# Patient Record
Sex: Female | Born: 1940 | Race: Asian | Hispanic: No | Marital: Married
Health system: Southern US, Community
[De-identification: ages and names within clinical notes are randomized; demographics above are authoritative.]

## PROBLEM LIST (undated history)

## (undated) DIAGNOSIS — B351 Tinea unguium: Secondary | ICD-10-CM

## (undated) DIAGNOSIS — R011 Cardiac murmur, unspecified: Secondary | ICD-10-CM

## (undated) DIAGNOSIS — I350 Nonrheumatic aortic (valve) stenosis: Secondary | ICD-10-CM

## (undated) DIAGNOSIS — I10 Essential (primary) hypertension: Secondary | ICD-10-CM

## (undated) DIAGNOSIS — M810 Age-related osteoporosis without current pathological fracture: Secondary | ICD-10-CM

## (undated) DIAGNOSIS — E559 Vitamin D deficiency, unspecified: Secondary | ICD-10-CM

## (undated) DIAGNOSIS — N183 Chronic kidney disease, stage 3 unspecified: Secondary | ICD-10-CM

## (undated) DIAGNOSIS — I739 Peripheral vascular disease, unspecified: Secondary | ICD-10-CM

## (undated) DIAGNOSIS — J302 Other seasonal allergic rhinitis: Secondary | ICD-10-CM

## (undated) HISTORY — DX: Tinea unguium: B35.1

## (undated) HISTORY — DX: Chronic kidney disease, stage 3 unspecified: N18.30

## (undated) HISTORY — DX: Cardiac murmur, unspecified: R01.1

## (undated) HISTORY — DX: Peripheral vascular disease, unspecified: I73.9

## (undated) HISTORY — PX: KNEE SURGERY: SHX244

## (undated) HISTORY — DX: Nonrheumatic aortic (valve) stenosis: I35.0

## (undated) HISTORY — DX: Vitamin D deficiency, unspecified: E55.9

## (undated) HISTORY — DX: Age-related osteoporosis without current pathological fracture: M81.0

## (undated) HISTORY — DX: Other seasonal allergic rhinitis: J30.2

---

## 2006-03-18 ENCOUNTER — Ambulatory Visit (HOSPITAL_COMMUNITY): Admission: RE | Admit: 2006-03-18 | Discharge: 2006-03-18 | Payer: Self-pay | Admitting: Family Medicine

## 2006-03-26 ENCOUNTER — Ambulatory Visit: Payer: Self-pay | Admitting: Nurse Practitioner

## 2010-09-29 ENCOUNTER — Encounter: Payer: Self-pay | Admitting: Internal Medicine

## 2010-11-29 ENCOUNTER — Other Ambulatory Visit: Payer: Self-pay | Admitting: Internal Medicine

## 2010-11-29 DIAGNOSIS — Z1231 Encounter for screening mammogram for malignant neoplasm of breast: Secondary | ICD-10-CM

## 2010-12-10 ENCOUNTER — Ambulatory Visit
Admission: RE | Admit: 2010-12-10 | Discharge: 2010-12-10 | Disposition: A | Discharge status: Home / Self Care | Payer: Medicare Other | Source: Ambulatory Visit | Attending: Internal Medicine | Admitting: Internal Medicine

## 2010-12-10 DIAGNOSIS — Z1231 Encounter for screening mammogram for malignant neoplasm of breast: Secondary | ICD-10-CM

## 2011-01-15 ENCOUNTER — Other Ambulatory Visit: Payer: Self-pay | Admitting: Gastroenterology

## 2011-11-17 ENCOUNTER — Other Ambulatory Visit: Payer: Self-pay | Admitting: Internal Medicine

## 2011-11-17 DIAGNOSIS — Z1231 Encounter for screening mammogram for malignant neoplasm of breast: Secondary | ICD-10-CM

## 2011-12-11 ENCOUNTER — Ambulatory Visit: Payer: Medicare Other

## 2012-10-19 ENCOUNTER — Other Ambulatory Visit (HOSPITAL_COMMUNITY): Payer: Self-pay | Admitting: Internal Medicine

## 2012-10-19 DIAGNOSIS — Z1231 Encounter for screening mammogram for malignant neoplasm of breast: Secondary | ICD-10-CM

## 2012-10-25 ENCOUNTER — Ambulatory Visit (HOSPITAL_COMMUNITY)
Admission: RE | Admit: 2012-10-25 | Discharge: 2012-10-25 | Disposition: A | Discharge status: Home / Self Care | Payer: Medicare Other | Source: Ambulatory Visit | Attending: Internal Medicine | Admitting: Internal Medicine

## 2012-10-25 DIAGNOSIS — Z1231 Encounter for screening mammogram for malignant neoplasm of breast: Secondary | ICD-10-CM

## 2013-11-25 ENCOUNTER — Other Ambulatory Visit: Payer: Self-pay

## 2013-11-25 DIAGNOSIS — Z1231 Encounter for screening mammogram for malignant neoplasm of breast: Secondary | ICD-10-CM

## 2013-12-16 ENCOUNTER — Ambulatory Visit
Admission: RE | Admit: 2013-12-16 | Discharge: 2013-12-16 | Disposition: A | Discharge status: Home / Self Care | Payer: Medicare Other | Source: Ambulatory Visit

## 2013-12-16 DIAGNOSIS — Z1231 Encounter for screening mammogram for malignant neoplasm of breast: Secondary | ICD-10-CM

## 2014-12-05 ENCOUNTER — Other Ambulatory Visit (HOSPITAL_COMMUNITY): Payer: Self-pay

## 2014-12-05 DIAGNOSIS — Z1231 Encounter for screening mammogram for malignant neoplasm of breast: Secondary | ICD-10-CM

## 2014-12-19 ENCOUNTER — Other Ambulatory Visit (HOSPITAL_COMMUNITY): Payer: Self-pay | Admitting: Internal Medicine

## 2014-12-19 ENCOUNTER — Ambulatory Visit (HOSPITAL_COMMUNITY)
Admission: RE | Admit: 2014-12-19 | Discharge: 2014-12-19 | Disposition: A | Discharge status: Home / Self Care | Payer: Commercial Managed Care - HMO | Source: Ambulatory Visit | Attending: Internal Medicine | Admitting: Internal Medicine

## 2014-12-19 DIAGNOSIS — Z1231 Encounter for screening mammogram for malignant neoplasm of breast: Secondary | ICD-10-CM | POA: Insufficient documentation

## 2015-01-22 ENCOUNTER — Other Ambulatory Visit (HOSPITAL_COMMUNITY): Payer: Self-pay | Admitting: Internal Medicine

## 2015-01-22 DIAGNOSIS — K219 Gastro-esophageal reflux disease without esophagitis: Secondary | ICD-10-CM

## 2015-02-07 ENCOUNTER — Telehealth: Payer: Self-pay | Admitting: Radiology

## 2015-02-08 ENCOUNTER — Ambulatory Visit (HOSPITAL_COMMUNITY): Payer: Commercial Managed Care - HMO

## 2015-11-22 ENCOUNTER — Other Ambulatory Visit: Payer: Self-pay

## 2015-11-22 DIAGNOSIS — Z1231 Encounter for screening mammogram for malignant neoplasm of breast: Secondary | ICD-10-CM

## 2015-12-24 ENCOUNTER — Ambulatory Visit: Payer: Self-pay

## 2016-01-21 ENCOUNTER — Ambulatory Visit
Admission: RE | Admit: 2016-01-21 | Discharge: 2016-01-21 | Disposition: A | Discharge status: Home / Self Care | Payer: Medicaid Other | Source: Ambulatory Visit

## 2016-01-21 DIAGNOSIS — Z1231 Encounter for screening mammogram for malignant neoplasm of breast: Secondary | ICD-10-CM

## 2016-03-07 ENCOUNTER — Encounter (HOSPITAL_COMMUNITY): Payer: Self-pay | Admitting: Emergency Medicine

## 2016-03-07 ENCOUNTER — Emergency Department (HOSPITAL_COMMUNITY): Payer: No Typology Code available for payment source

## 2016-03-07 ENCOUNTER — Emergency Department (HOSPITAL_COMMUNITY)
Admission: EM | Admit: 2016-03-07 | Discharge: 2016-03-07 | Disposition: A | Discharge status: Home / Self Care | Payer: No Typology Code available for payment source | Attending: Emergency Medicine | Admitting: Emergency Medicine

## 2016-03-07 DIAGNOSIS — Y939 Activity, unspecified: Secondary | ICD-10-CM | POA: Insufficient documentation

## 2016-03-07 DIAGNOSIS — S0990XA Unspecified injury of head, initial encounter: Secondary | ICD-10-CM | POA: Diagnosis not present

## 2016-03-07 DIAGNOSIS — I10 Essential (primary) hypertension: Secondary | ICD-10-CM | POA: Diagnosis not present

## 2016-03-07 DIAGNOSIS — S20219A Contusion of unspecified front wall of thorax, initial encounter: Secondary | ICD-10-CM | POA: Diagnosis not present

## 2016-03-07 DIAGNOSIS — Y92410 Unspecified street and highway as the place of occurrence of the external cause: Secondary | ICD-10-CM | POA: Diagnosis not present

## 2016-03-07 DIAGNOSIS — Z79899 Other long term (current) drug therapy: Secondary | ICD-10-CM | POA: Insufficient documentation

## 2016-03-07 DIAGNOSIS — S299XXA Unspecified injury of thorax, initial encounter: Secondary | ICD-10-CM | POA: Diagnosis present

## 2016-03-07 DIAGNOSIS — M546 Pain in thoracic spine: Secondary | ICD-10-CM | POA: Diagnosis not present

## 2016-03-07 DIAGNOSIS — Y999 Unspecified external cause status: Secondary | ICD-10-CM | POA: Insufficient documentation

## 2016-03-07 HISTORY — DX: Essential (primary) hypertension: I10

## 2016-03-07 MED ORDER — IBUPROFEN 400 MG PO TABS: 400.0000 mg | ORAL_TABLET | Freq: Three times a day (TID) | ORAL | Status: DC | PRN

## 2016-03-07 MED ORDER — OXYCODONE HCL 5 MG PO TABS
5.0000 mg | ORAL_TABLET | Freq: Once | ORAL | Status: AC
Start: 2016-03-07 — End: 2016-03-07
  Administered 2016-03-07: 5 mg via ORAL
  Filled 2016-03-07: qty 1

## 2016-03-07 MED ORDER — IBUPROFEN 600 MG PO TABS: 600.0000 mg | ORAL_TABLET | Freq: Three times a day (TID) | ORAL | Status: DC | PRN

## 2016-03-07 MED ORDER — IBUPROFEN 400 MG PO TABS
600.0000 mg | ORAL_TABLET | Freq: Once | ORAL | Status: AC
  Administered 2016-03-07: 600 mg via ORAL
  Filled 2016-03-07: qty 1

## 2016-03-07 NOTE — ED Notes (Addendum)
Patient speaks some english.  States her primary language is [place].

## 2016-03-07 NOTE — ED Notes (Signed)
Pt transported to XRay 
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
## 2017-01-14 ENCOUNTER — Other Ambulatory Visit: Payer: Self-pay | Admitting: Internal Medicine

## 2017-01-14 DIAGNOSIS — Z1231 Encounter for screening mammogram for malignant neoplasm of breast: Secondary | ICD-10-CM

## 2017-01-27 ENCOUNTER — Ambulatory Visit
Admission: RE | Admit: 2017-01-27 | Discharge: 2017-01-27 | Disposition: A | Discharge status: Home / Self Care | Payer: Medicaid Other | Source: Ambulatory Visit | Attending: Internal Medicine | Admitting: Internal Medicine

## 2017-01-27 DIAGNOSIS — Z1231 Encounter for screening mammogram for malignant neoplasm of breast: Secondary | ICD-10-CM

## 2017-03-18 NOTE — Congregational Nurse Program (Signed)
Congregational Nurse Program Note  Date of Encounter: 03/18/2017  Past Medical History: Past Medical History:  Diagnosis Date  . Hypertension     Encounter Details:  Patient was accompanied to CN office by [facility].  Diu Hartshorn interpreted.  Patient has been having right low back pain radiating down back of leg to the knee for 5 days.  Pain started after she was gardening.  States she has not tried anything to alleviate the pain  She is out of her muscle relaxant Tizanidine.  Refill requested from [facility] on [address].  Chart indicates she is allergic to Tylenol but patient does not remember stating this.  She is afraid to use the Voltaren Gel because she has itchy skin and she is not supposed to use it on open areas.  Advised to ice it until she can see her doctor at [facility]. She will ask him about pain medicine and also about a prescription for Triamcinolone Acetonide cream which he was supposed to have called in to her pharmacy after last visit, but pharmacy says it was never received.  BP today 118/60.  Jake Michaelis RN, Congregational Nurse [phone]

## 2017-05-01 ENCOUNTER — Ambulatory Visit (INDEPENDENT_AMBULATORY_CARE_PROVIDER_SITE_OTHER): Payer: Medicare HMO | Admitting: Orthopaedic Surgery

## 2017-05-26 ENCOUNTER — Encounter (INDEPENDENT_AMBULATORY_CARE_PROVIDER_SITE_OTHER): Payer: Self-pay | Admitting: Orthopaedic Surgery

## 2017-05-26 ENCOUNTER — Ambulatory Visit (INDEPENDENT_AMBULATORY_CARE_PROVIDER_SITE_OTHER): Payer: Medicare HMO

## 2017-05-26 ENCOUNTER — Ambulatory Visit (INDEPENDENT_AMBULATORY_CARE_PROVIDER_SITE_OTHER): Payer: Medicare HMO | Admitting: Orthopaedic Surgery

## 2017-05-26 DIAGNOSIS — M1711 Unilateral primary osteoarthritis, right knee: Secondary | ICD-10-CM | POA: Diagnosis not present

## 2017-05-26 HISTORY — DX: Unilateral primary osteoarthritis, right knee: M17.11

## 2017-05-26 MED ORDER — MELOXICAM 7.5 MG PO TABS: 7.5000 mg | ORAL_TABLET | Freq: Two times a day (BID) | ORAL | 2 refills | Status: DC | PRN

## 2017-05-26 NOTE — Progress Notes (Signed)
Office Visit Note   Patient: Krystal Maddox           Date of Birth: Feb 06, 1941           MRN: [id number] Visit Date: 05/26/2017              Requested by: Prince Solian, MD [address] [place], [place] [address] PCP: Prince Solian, MD   Assessment & Plan: Visit Diagnoses:  1. Primary osteoarthritis of right knee     Plan: Patient has moderate right knee osteoarthritis. Cortisone injection performed today. Prescription for Amoxil can. Questions encouraged and answered. Today's encounter was made more complex given the language barrier.  Follow-Up Instructions: Return if symptoms worsen or fail to improve.   Orders:  Orders Placed This Encounter  Procedures  . XR KNEE 3 VIEW RIGHT   Meds ordered this encounter  Medications  . meloxicam (MOBIC) 7.5 MG tablet    Sig: Take 1 tablet (7.5 mg total) by mouth 2 (two) times daily as needed for pain.    Dispense:  30 tablet    Refill:  2      Procedures: Large Joint Inj Date/Time: 05/26/2017 2:07 PM Performed by: Leandrew Koyanagi Authorized by: Leandrew Koyanagi   Consent Given by:  Patient Timeout: prior to procedure the correct patient, procedure, and site was verified   Indications:  Pain Location:  Knee Site:  R knee Prep: patient was prepped and draped in usual sterile fashion   Needle Size:  22 G Ultrasound Guidance: No   Fluoroscopic Guidance: No   Arthrogram: No   Patient tolerance:  Patient tolerated the procedure well with no immediate complications     Clinical Data: No additional findings.   Subjective: Chief Complaint  Patient presents with  . Right Knee - Pain    Patient's 76 year old female comes in with right knee pain since 03/15/2017. She denies any injuries. This progressively got worse. Pain does not radiate. Denies any numbness and tingling. Denies any injuries. She's been using over-the-counter arthritis cream with relief.    Review of Systems  Constitutional: Negative.   HENT:  Negative.   Eyes: Negative.   Respiratory: Negative.   Cardiovascular: Negative.   Endocrine: Negative.   Musculoskeletal: Negative.   Neurological: Negative.   Hematological: Negative.   Psychiatric/Behavioral: Negative.   All other systems reviewed and are negative.    Objective: Vital Signs: There were no vitals taken for this visit.  Physical Exam  Constitutional: She is oriented to person, place, and time. She appears well-developed and well-nourished.  HENT:  Head: Normocephalic and atraumatic.  Eyes: EOM are normal.  Neck: Neck supple.  Pulmonary/Chest: Effort normal.  Abdominal: Soft.  Neurological: She is alert and oriented to person, place, and time.  Skin: Skin is warm. Capillary refill takes less than 2 seconds.  Psychiatric: She has a normal mood and affect. Her behavior is normal. Judgment and thought content normal.  Nursing note and vitals reviewed.   Ortho Exam Right knee exam shows no joint effusion. She has good range of motion.  Collaterals and cruciates are stable. Patellar tracking is normal. Specialty Comments:  No specialty comments available.  Imaging: Xr Knee 3 View Right  Result Date: 05/26/2017 Moderate osteoarthritis right knee.    PMFS History: Patient Active Problem List   Diagnosis Date Noted  . Primary osteoarthritis of right knee 05/26/2017   Past Medical History:  Diagnosis Date  . Hypertension     No family history on  file.  Past Surgical History:  Procedure Laterality Date  . KNEE SURGERY Left    Social History   Occupational History  . Not on file.   Social History Main Topics  . Smoking status: Never Smoker  . Smokeless tobacco: Never Used  . Alcohol use Not on file  . Drug use: Unknown  . Sexual activity: Not on file

## 2017-08-25 ENCOUNTER — Other Ambulatory Visit: Payer: Self-pay | Admitting: Internal Medicine

## 2017-08-25 DIAGNOSIS — E2839 Other primary ovarian failure: Secondary | ICD-10-CM

## 2017-09-21 ENCOUNTER — Ambulatory Visit
Admission: RE | Admit: 2017-09-21 | Discharge: 2017-09-21 | Disposition: A | Discharge status: Home / Self Care | Payer: Medicaid Other | Source: Ambulatory Visit | Attending: Internal Medicine | Admitting: Internal Medicine

## 2017-09-21 DIAGNOSIS — E2839 Other primary ovarian failure: Secondary | ICD-10-CM

## 2019-11-10 ENCOUNTER — Other Ambulatory Visit (HOSPITAL_COMMUNITY): Payer: Self-pay | Admitting: Internal Medicine

## 2019-11-10 DIAGNOSIS — R011 Cardiac murmur, unspecified: Secondary | ICD-10-CM

## 2019-11-18 ENCOUNTER — Other Ambulatory Visit: Payer: Self-pay

## 2019-11-18 ENCOUNTER — Ambulatory Visit (HOSPITAL_COMMUNITY)
Admission: RE | Admit: 2019-11-18 | Discharge: 2019-11-18 | Disposition: A | Discharge status: Home / Self Care | Payer: Medicare (Managed Care) | Source: Ambulatory Visit | Attending: Internal Medicine | Admitting: Internal Medicine

## 2019-11-18 DIAGNOSIS — R011 Cardiac murmur, unspecified: Secondary | ICD-10-CM | POA: Diagnosis not present

## 2019-11-18 DIAGNOSIS — I350 Nonrheumatic aortic (valve) stenosis: Secondary | ICD-10-CM | POA: Diagnosis present

## 2019-11-18 DIAGNOSIS — I10 Essential (primary) hypertension: Secondary | ICD-10-CM | POA: Diagnosis not present

## 2019-11-18 NOTE — Progress Notes (Signed)
  Echocardiogram 2D Echocardiogram has been performed.  Krystal Maddox 11/18/2019, 4:12 PM

## 2019-12-14 ENCOUNTER — Other Ambulatory Visit: Payer: Self-pay | Admitting: Internal Medicine

## 2019-12-14 DIAGNOSIS — M79605 Pain in left leg: Secondary | ICD-10-CM

## 2019-12-19 ENCOUNTER — Ambulatory Visit (HOSPITAL_COMMUNITY)
Admission: RE | Admit: 2019-12-19 | Discharge: 2019-12-19 | Disposition: A | Discharge status: Home / Self Care | Payer: Medicare (Managed Care) | Source: Ambulatory Visit | Attending: Surgery | Admitting: Surgery

## 2019-12-19 ENCOUNTER — Other Ambulatory Visit (HOSPITAL_COMMUNITY): Payer: Self-pay | Admitting: Internal Medicine

## 2019-12-19 ENCOUNTER — Other Ambulatory Visit: Payer: Self-pay

## 2019-12-19 ENCOUNTER — Ambulatory Visit (INDEPENDENT_AMBULATORY_CARE_PROVIDER_SITE_OTHER)
Admission: RE | Admit: 2019-12-19 | Discharge: 2019-12-19 | Disposition: A | Discharge status: Home / Self Care | Payer: Medicare (Managed Care) | Source: Ambulatory Visit | Attending: Surgery | Admitting: Surgery

## 2019-12-19 DIAGNOSIS — I739 Peripheral vascular disease, unspecified: Secondary | ICD-10-CM

## 2020-01-20 ENCOUNTER — Encounter: Payer: Self-pay | Admitting: Cardiovascular Disease

## 2020-01-20 ENCOUNTER — Other Ambulatory Visit: Payer: Self-pay

## 2020-01-20 ENCOUNTER — Other Ambulatory Visit: Payer: Self-pay | Admitting: Internal Medicine

## 2020-01-20 ENCOUNTER — Ambulatory Visit (INDEPENDENT_AMBULATORY_CARE_PROVIDER_SITE_OTHER): Payer: Medicare (Managed Care) | Admitting: Cardiovascular Disease

## 2020-01-20 ENCOUNTER — Ambulatory Visit
Admission: RE | Admit: 2020-01-20 | Discharge: 2020-01-20 | Disposition: A | Discharge status: Home / Self Care | Payer: Medicare (Managed Care) | Source: Ambulatory Visit | Attending: Internal Medicine | Admitting: Internal Medicine

## 2020-01-20 VITALS — BP 148/62 | HR 80 | Ht 59.0 in | Wt 150.5 lb

## 2020-01-20 DIAGNOSIS — I10 Essential (primary) hypertension: Secondary | ICD-10-CM

## 2020-01-20 DIAGNOSIS — R52 Pain, unspecified: Secondary | ICD-10-CM

## 2020-01-20 DIAGNOSIS — I350 Nonrheumatic aortic (valve) stenosis: Secondary | ICD-10-CM | POA: Diagnosis not present

## 2020-01-20 DIAGNOSIS — R011 Cardiac murmur, unspecified: Secondary | ICD-10-CM | POA: Diagnosis not present

## 2020-01-20 NOTE — Patient Instructions (Signed)
Medication Instructions:  °Your physician recommends that you continue on your current medications as directed. Please refer to the Current Medication list given to you today. ° °*If you need a refill on your cardiac medications before your next appointment, please call your pharmacy* ° ° °Lab Work: °None Ordered °If you have labs (blood work) drawn today and your tests are completely normal, you will receive your results only by: °• MyChart Message (if you have MyChart) OR °• A paper copy in the mail °If you have any lab test that is abnormal or we need to change your treatment, we will call you to review the results. ° ° °Testing/Procedures: °None Ordered ° ° °Follow-Up: °At [facility], you and your health needs are our priority.  As part of our continuing mission to provide you with exceptional heart care, we have created designated Provider Care Teams.  These Care Teams include your primary Cardiologist (physician) and Advanced Practice Providers (APPs -  Physician Assistants and Nurse Practitioners) who all work together to provide you with the care you need, when you need it. ° °We recommend signing up for the patient portal called "MyChart".  Sign up information is provided on this After Visit Summary.  MyChart is used to connect with patients for Virtual Visits (Telemedicine).  Patients are able to view lab/test results, encounter notes, upcoming appointments, etc.  Non-urgent messages can be sent to your provider as well.   °To learn more about what you can do with MyChart, go to [email or web address].   ° °Your next appointment:   °6 month(s) ° °The format for your next appointment:   °In Person ° °Provider:   °You may see Philip Nahser, MD or one of the following Advanced Practice Providers on your designated Care Team:   °· Scott Weaver, PA-C °· Vin Bhagat, PA-C ° ° ° ° °

## 2020-01-20 NOTE — Progress Notes (Signed)
Cardiology Office Note:    Date:  01/20/2020   ID:  Krystal Maddox, DOB [birth year], MRN [id number]  PCP:  Prince Solian, MD  Cardiologist:  Luciann Gossett   Electrophysiologist:  None   Referring MD: Waylan Rocher,*   Chief Complaint  Patient presents with  . Aortic Stenosis  . Hypertension     History of Present Illness:    Krystal Maddox is a 79 y.o. female with a hx of hypertension, heart murmur, shortness of breath, stage III chronic kidney disease.  She was recently found to have aortic stenosis.  We are asked to evaluate her for further evaluation and management of her aortic stenosis.  Seen with interpreter, Raynaldo Opitz   She was previously seen by Dr. Elsworth Soho.  More recently she has been seeing [facility]. Echocardiogram from November 18, 2019 reveals normal left ventricular systolic function.  She has grade 1 diastolic dysfunction.  She has severe calcification and thickening of the aortic valve.  There is a mean aortic valve gradient of 36 mmHg with a peak gradient of 71.7 mmHg.  She has had a heart murmur for years .  Coughing recently . Has cp when she cough.  Coughs only rarely   No knowledge of rheumatic fever as a child   Has had both covid vaccines.   Has DOE , no cp with exertion  Unable to walk very far due to DOE .     Past Medical History:  Diagnosis Date  . CKD (chronic kidney disease), stage III   . Hypertension   . Murmur   . Onychomycosis   . Osteoporosis   . Peripheral arterial disease (HCC)    OF LEFT EXTREMITY   . Primary osteoarthritis of right knee 05/26/2017  . Seasonal allergies   . Vitamin D deficiency     Past Surgical History:  Procedure Laterality Date  . KNEE SURGERY Left     Current Medications: Current Meds  Medication Sig  . amLODipine (NORVASC) 5 MG tablet Take 5 mg by mouth daily.   Marland Kitchen aspirin EC 81 MG tablet Take 81 mg by mouth daily.  Marland Kitchen Besifloxacin HCl (BESIVANCE) 0.6 % SUSP Apply 1 drop to eye 2 (two) times  daily.  . Calcium Carb-Cholecalciferol (CALCIUM 600+D3 PO) Take by mouth.  Marland Kitchen ibuprofen (ADVIL,MOTRIN) 400 MG tablet Take 1 tablet (400 mg total) by mouth every 8 (eight) hours as needed.  Marland Kitchen losartan-hydrochlorothiazide (HYZAAR) 100-12.5 MG tablet Take 1 tablet by mouth daily.   . pravastatin (PRAVACHOL) 40 MG tablet Take 40 mg by mouth daily.     Allergies:   Tylenol [acetaminophen]   Social History   Socioeconomic History  . Marital status: Unknown    Spouse name: Not on file  . Number of children: Not on file  . Years of education: Not on file  . Highest education level: Not on file  Occupational History  . Not on file  Tobacco Use  . Smoking status: Never Smoker  . Smokeless tobacco: Never Used  Substance and Sexual Activity  . Alcohol use: Not on file  . Drug use: Not on file  . Sexual activity: Not on file  Other Topics Concern  . Not on file  Social History Narrative  . Not on file   Social Determinants of Health   Financial Resource Strain:   . Difficulty of Paying Living Expenses:   Food Insecurity:   . Worried About Charity fundraiser in  the Last Year:   . [place] in the Last Year:   Transportation Needs:   . Film/video editor (Medical):   Marland Kitchen Lack of Transportation (Non-Medical):   Physical Activity:   . Days of Exercise per Week:   . Minutes of Exercise per Session:   Stress:   . Feeling of Stress :   Social Connections:   . Frequency of Communication with Friends and Family:   . Frequency of Social Gatherings with Friends and Family:   . Attends Religious Services:   . Active Member of Clubs or Organizations:   . Attends Archivist Meetings:   Marland Kitchen Marital Status:      Family History: The patient's family history is not on file.  Family is in [place].   No known medical histyory.   Family did not go to the doctor   ROS:   Please see the history of present illness.     All other systems reviewed and are  negative.  EKGs/Labs/Other Studies Reviewed:    The following studies were reviewed today:   EKG:   Jan 20, 2020: Normal sinus rhythm at 75.  Nonspecific ST and T wave abnormalities.  Recent Labs: No results found for requested labs within last 8760 hours.  Recent Lipid Panel No results found for: CHOL, TRIG, HDL, CHOLHDL, VLDL, LDLCALC, LDLDIRECT  Physical Exam:    VS:  BP (!) 148/62   Pulse 80   Ht 4\' 11"  (1.499 m)   Wt 150 lb 8 oz (68.3 kg)   SpO2 97%   BMI 30.40 kg/m     Wt Readings from Last 3 Encounters:  01/20/20 150 lb 8 oz (68.3 kg)     GEN: Elderly female, moderately obese. HEENT: Normal NECK: No JVD; bilateral carotid bruits LYMPHATICS: No lymphadenopathy CARDIAC: Regular rate J8-A4.  3/6 systolic murmur at the left sternal border radiating to the upper right sternal border.  It also seems to radiate to the axilla. RESPIRATORY:  Clear to auscultation without rales, wheezing or rhonchi  ABDOMEN: Soft, non-tender, non-distended MUSCULOSKELETAL:  No edema; No deformity  SKIN: Warm and dry NEUROLOGIC:  Alert and oriented x 3 PSYCHIATRIC:  Normal affect   ASSESSMENT:    1. Aortic valve stenosis, etiology of cardiac valve disease unspecified   2. Cardiac murmur   3. Essential hypertension    PLAN:    In order of problems listed above:  1. Aortic stenosis: She has severe aortic stenosis by echocardiogram.   I suspect that she had rheumatic fever as a child.  Interview is difficult because of language barrier.  We were able to use an interpreter and made progress.  She is does not appear to be all that limited from shortness of breath.  She is mostly limited by some knee pain.  She is not been exercising because of Covid.  We discussed sending her to valve clinic and she would like to exercise first.  We will have her start a regular exercise program.  She is seeing the doctor for her knee pain.  I will plan on seeing her again in 6 months for follow-up office  visit.  Anticipate sending her to the structural heart clinic in the very near future.   Medication Adjustments/Labs and Tests Ordered: Current medicines are reviewed at length with the patient today.  Concerns regarding medicines are outlined above.  Orders Placed This Encounter  Procedures  . EKG 12-Lead   No orders of the  defined types were placed in this encounter.   Patient Instructions  Medication Instructions:  Your physician recommends that you continue on your current medications as directed. Please refer to the Current Medication list given to you today.  *If you need a refill on your cardiac medications before your next appointment, please call your pharmacy*   Lab Work: None Ordered If you have labs (blood work) drawn today and your tests are completely normal, you will receive your results only by: Marland Kitchen MyChart Message (if you have MyChart) OR . A paper copy in the mail If you have any lab test that is abnormal or we need to change your treatment, we will call you to review the results.   Testing/Procedures: None Ordered   Follow-Up: At [facility], you and your health needs are our priority.  As part of our continuing mission to provide you with exceptional heart care, we have created designated Provider Care Teams.  These Care Teams include your primary Cardiologist (physician) and Advanced Practice Providers (APPs -  Physician Assistants and Nurse Practitioners) who all work together to provide you with the care you need, when you need it.  We recommend signing up for the patient portal called "MyChart".  Sign up information is provided on this After Visit Summary.  MyChart is used to connect with patients for Virtual Visits (Telemedicine).  Patients are able to view lab/test results, encounter notes, upcoming appointments, etc.  Non-urgent messages can be sent to your provider as well.   To learn more about what you can do with MyChart, go to  [email or web address].    Your next appointment:   6 month(s)  The format for your next appointment:   In Person  Provider:   You may see Mertie Moores, MD or one of the following Advanced Practice Providers on your designated Care Team:    Richardson Dopp, PA-C  Robbie Lis, [place]         Signed, Mertie Moores, MD  01/20/2020 5:37 PM    [place]

## 2020-03-06 ENCOUNTER — Ambulatory Visit (INDEPENDENT_AMBULATORY_CARE_PROVIDER_SITE_OTHER): Payer: Medicare (Managed Care) | Admitting: Vascular Surgery

## 2020-03-06 ENCOUNTER — Other Ambulatory Visit: Payer: Self-pay

## 2020-03-06 ENCOUNTER — Encounter: Payer: Self-pay | Admitting: Vascular Surgery

## 2020-03-06 VITALS — BP 120/66 | HR 75 | Temp 98.2°F | Resp 20 | Ht 59.0 in | Wt 147.0 lb

## 2020-03-06 DIAGNOSIS — I739 Peripheral vascular disease, unspecified: Secondary | ICD-10-CM | POA: Diagnosis not present

## 2020-03-06 NOTE — Progress Notes (Signed)
Vascular and Vein Specialist of [facility]  Patient name: Krystal Maddox MRN: [id number] DOB: 1941-06-15 Sex: female  REASON FOR CONSULT: Evaluation lower extremity discomfort  HPI: Krystal Maddox is a 79 y.o. female, who is here today for evaluation.  She has a interpreter with her and does not understand [place].  She reports discomfort in both feet.  This can be more in her left side than her right but both sides are affected.  She seems to report that this is not limiting to her.  She has no history of tissue loss and no history of arterial rest pain.  No history of cardiac disease  Past Medical History:  Diagnosis Date  . CKD (chronic kidney disease), stage III   . Hypertension   . Murmur   . Onychomycosis   . Osteoporosis   . Peripheral arterial disease (HCC)    OF LEFT EXTREMITY   . Primary osteoarthritis of right knee 05/26/2017  . Seasonal allergies   . Vitamin D deficiency     History reviewed. No pertinent family history.  SOCIAL HISTORY: Social History   Socioeconomic History  . Marital status: Unknown    Spouse name: Not on file  . Number of children: Not on file  . Years of education: Not on file  . Highest education level: Not on file  Occupational History  . Not on file  Tobacco Use  . Smoking status: Never Smoker  . Smokeless tobacco: Never Used  Vaping Use  . Vaping Use: Never used  Substance and Sexual Activity  . Alcohol use: Never  . Drug use: Never  . Sexual activity: Not on file  Other Topics Concern  . Not on file  Social History Narrative  . Not on file   Social Determinants of Health   Financial Resource Strain:   . Difficulty of Paying Living Expenses:   Food Insecurity:   . Worried About Charity fundraiser in the Last Year:   . Arboriculturist in the Last Year:   Transportation Needs:   . Film/video editor (Medical):   Marland Kitchen Lack of Transportation (Non-Medical):   Physical Activity:   . Days of  Exercise per Week:   . Minutes of Exercise per Session:   Stress:   . Feeling of Stress :   Social Connections:   . Frequency of Communication with Friends and Family:   . Frequency of Social Gatherings with Friends and Family:   . Attends Religious Services:   . Active Member of Clubs or Organizations:   . Attends Archivist Meetings:   Marland Kitchen Marital Status:   Intimate Partner Violence:   . Fear of Current or Ex-Partner:   . Emotionally Abused:   Marland Kitchen Physically Abused:   . Sexually Abused:     Allergies  Allergen Reactions  . Tylenol [Acetaminophen]     Current Outpatient Medications  Medication Sig Dispense Refill  . amLODipine (NORVASC) 5 MG tablet Take 5 mg by mouth daily.   0  . aspirin EC 81 MG tablet Take 81 mg by mouth daily.    Marland Kitchen Besifloxacin HCl (BESIVANCE) 0.6 % SUSP Apply 1 drop to eye 2 (two) times daily.    . Calcium Carb-Cholecalciferol (CALCIUM 600+D3 PO) Take by mouth.    Marland Kitchen ibuprofen (ADVIL,MOTRIN) 400 MG tablet Take 1 tablet (400 mg total) by mouth every 8 (eight) hours as needed. 10 tablet 0  . losartan-hydrochlorothiazide (HYZAAR) 100-12.5 MG tablet Take 1 tablet by  mouth daily.   0  . pravastatin (PRAVACHOL) 40 MG tablet Take 40 mg by mouth daily.     No current facility-administered medications for this visit.    REVIEW OF SYSTEMS:  [X]  denotes positive finding, [ ]  denotes negative finding Cardiac  Comments:  Chest pain or chest pressure:    Shortness of breath upon exertion:    Short of breath when lying flat:    Irregular heart rhythm:        Vascular    Pain in calf, thigh, or hip brought on by ambulation: x   Pain in feet at night that wakes you up from your sleep:     Blood clot in your veins: x   Leg swelling:  x       Pulmonary    Oxygen at home:    Productive cough:     Wheezing:         Neurologic    Sudden weakness in arms or legs:     Sudden numbness in arms or legs:     Sudden onset of difficulty speaking or slurred  speech:    Temporary loss of vision in one eye:     Problems with dizziness:         Gastrointestinal    Blood in stool:     Vomited blood:         Genitourinary    Burning when urinating:     Blood in urine:        Psychiatric    Major depression:         Hematologic    Bleeding problems:    Problems with blood clotting too easily:        Skin    Rashes or ulcers:        Constitutional    Fever or chills:      PHYSICAL EXAM: Vitals:   03/06/20 1234  BP: 120/66  Pulse: 75  Resp: 20  Temp: 98.2 F (36.8 C)  SpO2: 95%  Weight: 147 lb (66.7 kg)  Height: 4\' 11"  (1.499 m)    GENERAL: The patient is a well-nourished female, in no acute distress. The vital signs are documented above. CARDIOVASCULAR: 2+ radial pulses bilaterally.  I do not palpate pedal pulses. PULMONARY: There is good air exchange  ABDOMEN: Soft and non-tender  MUSCULOSKELETAL: There are no major deformities or cyanosis. NEUROLOGIC: No focal weakness or paresthesias are detected. SKIN: There are no ulcers or rashes noted.  Her feet are warm and well-perfused.  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  DATA:  Noninvasive studies reveal ankle arm index 1.89 on the right and 0.66 on the left.  MEDICAL ISSUES: Noninvasive suggests potential proximal source.  It seems that a great deal of her concern is regarding varicosities in her feet and ankles most particularly on the left.  I explained that this is not dangerous.  She does have some moderate arterial insufficiency but this does not appear to be limiting to her.  I recommended no further work-up and she does appear to be comfortable with this discussion   Rosetta Posner, MD [facility] Vascular and Vein Specialists of [facility] Tel [phone] Pager [phone]

## 2020-07-24 ENCOUNTER — Ambulatory Visit (INDEPENDENT_AMBULATORY_CARE_PROVIDER_SITE_OTHER): Payer: Medicare (Managed Care) | Admitting: Cardiovascular Disease

## 2020-07-24 ENCOUNTER — Encounter: Payer: Self-pay | Admitting: Cardiovascular Disease

## 2020-07-24 ENCOUNTER — Other Ambulatory Visit: Payer: Self-pay

## 2020-07-24 VITALS — BP 122/58 | HR 80 | Ht 59.0 in | Wt 146.8 lb

## 2020-07-24 DIAGNOSIS — I350 Nonrheumatic aortic (valve) stenosis: Secondary | ICD-10-CM | POA: Diagnosis not present

## 2020-07-24 NOTE — Progress Notes (Signed)
Cardiology Office Note:    Date:  07/24/2020   ID:  Krystal Maddox, DOB Feb 21, 1941, MRN [id number]  PCP:  Prince Solian, MD  Cardiologist:  Khi Mcmillen   Electrophysiologist:  None   Referring MD: Prince Solian, MD   Chief Complaint  Patient presents with  . Aortic Stenosis     Previous notes:    Krystal Maddox is a 79 y.o. female with a hx of hypertension, heart murmur, shortness of breath, stage III chronic kidney disease.  She was recently found to have aortic stenosis.  We are asked to evaluate her for further evaluation and management of her aortic stenosis.  Seen with interpreter, Raynaldo Opitz   She was previously seen by Dr. Elsworth Soho.  More recently she has [facility] Echocardiogram from November 18, 2019 reveals normal left ventricular systolic function.  She has grade 1 diastolic dysfunction.  She has severe calcification and thickening of the aortic valve.  There is a mean aortic valve gradient of 36 mmHg with a peak gradient of 71.7 mmHg.  She has had a heart murmur for years .  Coughing recently . Has cp when she cough.  Coughs only rarely   No knowledge of rheumatic fever as a child   Has had both covid vaccines.   Has DOE , no cp with exertion  Unable to walk very far due to DOE .    July 24, 2020: Krystal Maddox is a 79 yo [place] .  She  is seen today for follow up of her AS, dyspnea, CKD. She has moderate - severe AS .  Echo from March, 2021 shows normal LV function.  Grade 1 DD.  Peak AS gradient of 71 with  Mean AS gradient of 36.   Is not very active Still has some shortness of breath but says she is feeling better.     Past Medical History:  Diagnosis Date  . CKD (chronic kidney disease), stage III ([place])   . Hypertension   . Murmur   . Onychomycosis   . Osteoporosis   . Peripheral arterial disease (HCC)    OF LEFT EXTREMITY   . Primary osteoarthritis of right knee 05/26/2017  . Seasonal allergies   . Vitamin D deficiency      Past Surgical History:  Procedure Laterality Date  . KNEE SURGERY Left     Current Medications: Current Meds  Medication Sig  . acetaminophen (TYLENOL) 650 MG CR tablet Take 650 mg by mouth in the morning and at bedtime.  Marland Kitchen amLODipine (NORVASC) 10 MG tablet Take 10 mg by mouth daily.   Marland Kitchen aspirin EC 81 MG tablet Take 81 mg by mouth daily.  . Calcium Carb-Cholecalciferol (CALCIUM 600+D3 PO) Take by mouth.  . losartan-hydrochlorothiazide (HYZAAR) 100-12.5 MG tablet Take 1 tablet by mouth daily.   . pravastatin (PRAVACHOL) 40 MG tablet Take 40 mg by mouth daily.  . [DISCONTINUED] Besifloxacin HCl (BESIVANCE) 0.6 % SUSP Apply 1 drop to eye 2 (two) times daily.  . [DISCONTINUED] ibuprofen (ADVIL,MOTRIN) 400 MG tablet Take 1 tablet (400 mg total) by mouth every 8 (eight) hours as needed.     Allergies:   Tylenol [acetaminophen]   Social History   Socioeconomic History  . Marital status: Unknown    Spouse name: Not on file  . Number of children: Not on file  . Years of education: Not on file  . Highest education level: Not on file  Occupational History  .  Not on file  Tobacco Use  . Smoking status: Never Smoker  . Smokeless tobacco: Never Used  Vaping Use  . Vaping Use: Never used  Substance and Sexual Activity  . Alcohol use: Never  . Drug use: Never  . Sexual activity: Not on file  Other Topics Concern  . Not on file  Social History Narrative  . Not on file   Social Determinants of Health   Financial Resource Strain:   . Difficulty of Paying Living Expenses: Not on file  Food Insecurity:   . Worried About Charity fundraiser in the Last Year: Not on file  . Ran Out of Food in the Last Year: Not on file  Transportation Needs:   . Lack of Transportation (Medical): Not on file  . Lack of Transportation (Non-Medical): Not on file  Physical Activity:   . Days of Exercise per Week: Not on file  . Minutes of Exercise per Session: Not on file  Stress:   . Feeling of  Stress : Not on file  Social Connections:   . Frequency of Communication with Friends and Family: Not on file  . Frequency of Social Gatherings with Friends and Family: Not on file  . Attends Religious Services: Not on file  . Active Member of Clubs or Organizations: Not on file  . Attends Archivist Meetings: Not on file  . Marital Status: Not on file     Family History: The patient's family history is not on file.  Family is in [place] .  She is a  Leisure centre manager   No known medical histyory.   Family did not go to the doctor   ROS:   Please see the history of present illness.     All other systems reviewed and are negative.  EKGs/Labs/Other Studies Reviewed:    The following studies were reviewed today:   EKG:      Recent Labs: No results found for requested labs within last 8760 hours.  Recent Lipid Panel No results found for: CHOL, TRIG, HDL, CHOLHDL, VLDL, LDLCALC, LDLDIRECT  Physical Exam:    Physical Exam: Blood pressure (!) 122/58, pulse 80, height 4\' 11"  (1.499 m), weight 146 lb 12.8 oz (66.6 kg), SpO2 96 %.  GEN:  Elderly female,  chronically ill appearing  HEENT: Normal NECK: No JVD; No carotid bruits LYMPHATICS: No lymphadenopathy CARDIAC: RRR  3/6 systolic murmur  RESPIRATORY:  Clear to auscultation without rales, wheezing or rhonchi  ABDOMEN: Soft, non-tender, non-distended MUSCULOSKELETAL:  No edema; No deformity  SKIN: Warm and dry NEUROLOGIC:  Alert and oriented x 3  ASSESSMENT:    1. Aortic valve stenosis, etiology of cardiac valve disease unspecified    PLAN:       Aortic stenosis:  She is stable  Will repeat echo in 6 months and have her see an APP the following week.  I suspect she will need to be referred to the valve clinic soon     Medication Adjustments/Labs and Tests Ordered: Current medicines are reviewed at length with the patient today.  Concerns regarding medicines are outlined above.  Orders Placed This Encounter   Procedures  . ECHOCARDIOGRAM COMPLETE   No orders of the defined types were placed in this encounter.   Patient Instructions  Medication Instructions:  Your physician recommends that you continue on your current medications as directed. Please refer to the Current Medication list given to you today.  *If you need a refill on your cardiac  medications before your next appointment, please call your pharmacy*   Lab Work: none If you have labs (blood work) drawn today and your tests are completely normal, you will receive your results only by: Marland Kitchen MyChart Message (if you have MyChart) OR . A paper copy in the mail If you have any lab test that is abnormal or we need to change your treatment, we will call you to review the results.   Testing/Procedures: (in 6 months prior to her APP appointment) Your physician has requested that you have an echocardiogram. Echocardiography is a painless test that uses sound waves to create images of your heart. It provides your doctor with information about the size and shape of your heart and how well your heart's chambers and valves are working. This procedure takes approximately one hour. There are no restrictions for this procedure.     Follow-Up: At [facility], you and your health needs are our priority.  As part of our continuing mission to provide you with exceptional heart care, we have created designated Provider Care Teams.  These Care Teams include your primary Cardiologist (physician) and Advanced Practice Providers (APPs -  Physician Assistants and Nurse Practitioners) who all work together to provide you with the care you need, when you need it.  We recommend signing up for the patient portal called "MyChart".  Sign up information is provided on this After Visit Summary.  MyChart is used to connect with patients for Virtual Visits (Telemedicine).  Patients are able to view lab/test results, encounter notes, upcoming appointments, etc.   Non-urgent messages can be sent to your provider as well.   To learn more about what you can do with MyChart, go to [email or web address].    Your next appointment:   6 month(s)  The format for your next appointment:   In Person  Provider:   You will see one of the following Advanced Practice Providers on your designated Care Team:    Richardson Dopp, PA-C  Robbie Lis, [place]     Other Instructions      Signed, Mertie Moores, MD  07/24/2020 5:44 PM    [place]

## 2020-07-24 NOTE — Patient Instructions (Addendum)
Medication Instructions:  Your physician recommends that you continue on your current medications as directed. Please refer to the Current Medication list given to you today.  *If you need a refill on your cardiac medications before your next appointment, please call your pharmacy*   Lab Work: none If you have labs (blood work) drawn today and your tests are completely normal, you will receive your results only by: Marland Kitchen MyChart Message (if you have MyChart) OR . A paper copy in the mail If you have any lab test that is abnormal or we need to change your treatment, we will call you to review the results.   Testing/Procedures: (in 6 months prior to her APP appointment) Your physician has requested that you have an echocardiogram. Echocardiography is a painless test that uses sound waves to create images of your heart. It provides your doctor with information about the size and shape of your heart and how well your heart's chambers and valves are working. This procedure takes approximately one hour. There are no restrictions for this procedure.     Follow-Up: At [facility], you and your health needs are our priority.  As part of our continuing mission to provide you with exceptional heart care, we have created designated Provider Care Teams.  These Care Teams include your primary Cardiologist (physician) and Advanced Practice Providers (APPs -  Physician Assistants and Nurse Practitioners) who all work together to provide you with the care you need, when you need it.  We recommend signing up for the patient portal called "MyChart".  Sign up information is provided on this After Visit Summary.  MyChart is used to connect with patients for Virtual Visits (Telemedicine).  Patients are able to view lab/test results, encounter notes, upcoming appointments, etc.  Non-urgent messages can be sent to your provider as well.   To learn more about what you can do with MyChart, go to  [email or web address].    Your next appointment:   6 month(s)  The format for your next appointment:   In Person  Provider:   You will see one of the following Advanced Practice Providers on your designated Care Team:    Richardson Dopp, PA-C  Robbie Lis, [place]     Other Instructions

## 2020-09-08 HISTORY — PX: EYE SURGERY: SHX253

## 2020-09-21 ENCOUNTER — Ambulatory Visit
Admission: RE | Admit: 2020-09-21 | Discharge: 2020-09-21 | Disposition: A | Discharge status: Home / Self Care | Payer: Medicare (Managed Care) | Source: Ambulatory Visit | Attending: Family Medicine | Admitting: Family Medicine

## 2020-09-21 ENCOUNTER — Other Ambulatory Visit: Payer: Self-pay | Admitting: Family Medicine

## 2020-09-21 DIAGNOSIS — R059 Cough, unspecified: Secondary | ICD-10-CM

## 2020-12-23 ENCOUNTER — Emergency Department (HOSPITAL_COMMUNITY): Payer: Medicare (Managed Care)

## 2020-12-23 ENCOUNTER — Emergency Department (HOSPITAL_COMMUNITY)
Admission: EM | Admit: 2020-12-23 | Discharge: 2020-12-23 | Disposition: A | Discharge status: Home / Self Care | Payer: Medicare (Managed Care) | Attending: Emergency Medicine | Admitting: Emergency Medicine

## 2020-12-23 DIAGNOSIS — I129 Hypertensive chronic kidney disease with stage 1 through stage 4 chronic kidney disease, or unspecified chronic kidney disease: Secondary | ICD-10-CM | POA: Insufficient documentation

## 2020-12-23 DIAGNOSIS — Z7982 Long term (current) use of aspirin: Secondary | ICD-10-CM | POA: Diagnosis not present

## 2020-12-23 DIAGNOSIS — W010XXA Fall on same level from slipping, tripping and stumbling without subsequent striking against object, initial encounter: Secondary | ICD-10-CM | POA: Diagnosis not present

## 2020-12-23 DIAGNOSIS — S81012A Laceration without foreign body, left knee, initial encounter: Secondary | ICD-10-CM | POA: Diagnosis not present

## 2020-12-23 DIAGNOSIS — Z79899 Other long term (current) drug therapy: Secondary | ICD-10-CM | POA: Diagnosis not present

## 2020-12-23 DIAGNOSIS — S8992XA Unspecified injury of left lower leg, initial encounter: Secondary | ICD-10-CM | POA: Diagnosis present

## 2020-12-23 DIAGNOSIS — M25561 Pain in right knee: Secondary | ICD-10-CM | POA: Diagnosis not present

## 2020-12-23 DIAGNOSIS — S0083XA Contusion of other part of head, initial encounter: Secondary | ICD-10-CM

## 2020-12-23 DIAGNOSIS — R519 Headache, unspecified: Secondary | ICD-10-CM | POA: Insufficient documentation

## 2020-12-23 DIAGNOSIS — N183 Chronic kidney disease, stage 3 unspecified: Secondary | ICD-10-CM | POA: Diagnosis not present

## 2020-12-23 MED ORDER — LIDOCAINE-EPINEPHRINE (PF) 2 %-1:200000 IJ SOLN
20.0000 mL | Freq: Once | INTRAMUSCULAR | Status: DC
  Filled 2020-12-23: qty 20

## 2020-12-23 NOTE — ED Triage Notes (Signed)
Pt arrived by EMS after experiencing a fall Pt states she tripped on lip of side walk and fell face and knees first onto the ground   laceration on left knee, bleeding control.  Abrasion on bridge of nose and forehead.  Some swelling noted on right knee.   Denies LOC and no neck or back pain  Pt is not on blood thinners.

## 2020-12-23 NOTE — ED Provider Notes (Signed)
..  Laceration Repair  Date/Time: 12/23/2020 1:44 PM Performed by: Domenic Moras, PA-C Authorized by: Domenic Moras, PA-C   Consent:    Consent obtained:  Verbal   Consent given by:  Patient   Risks discussed:  Infection, need for additional repair, pain, poor cosmetic result and poor wound healing   Alternatives discussed:  No treatment and delayed treatment Universal protocol:    Procedure explained and questions answered to patient or proxy's satisfaction: yes     Relevant documents present and verified: yes     Test results available: yes     Imaging studies available: yes     Required blood products, implants, devices, and special equipment available: yes     Site/side marked: yes     Immediately prior to procedure, a time out was called: yes     Patient identity confirmed:  Verbally with patient Anesthesia:    Anesthesia method:  Local infiltration   Local anesthetic:  Lidocaine 2% WITH epi Laceration details:    Location:  Leg   Leg location:  L knee   Length (cm):  6   Depth (mm):  5 Pre-procedure details:    Preparation:  Patient was prepped and draped in usual sterile fashion and imaging obtained to evaluate for foreign bodies Exploration:    Limited defect created (wound extended): yes     Hemostasis achieved with:  Epinephrine   Imaging obtained: x-ray     Imaging outcome: foreign body not noted     Wound exploration: wound explored through full range of motion and entire depth of wound visualized     Wound extent: muscle damage     Wound extent: no tendon damage noted and no underlying fracture noted     Contaminated: no   Treatment:    Area cleansed with:  Povidone-iodine   Amount of cleaning:  Standard   Irrigation solution:  Sterile saline   Irrigation method:  Pressure wash   Visualized foreign bodies/material removed: no     Debridement:  Minimal   Undermining:  Minimal   Scar revision: no   Skin repair:    Repair method:  Sutures   Suture size:  5-0    Suture material:  Prolene   Suture technique:  Simple interrupted   Number of sutures:  9 Approximation:    Approximation:  Close Repair type:    Repair type:  Intermediate Post-procedure details:    Dressing:  Adhesive bandage   Procedure completion:  Tolerated well, no immediate complications      Domenic Moras, PA-C 12/23/20 1346    Lacretia Leigh, MD 12/25/20 1126

## 2020-12-23 NOTE — ED Provider Notes (Signed)
[place] EMERGENCY DEPARTMENT Provider Note   CSN: [id number] Arrival date & time: 12/23/20  1110     History Chief Complaint  Patient presents with  . Fall    Krystal Maddox is a 80 y.o. female.  80 year old female here after mechanical fall just prior to arrival.  No LOC.  No use of blood thinners.  Complains of pain to her face and nose without nosebleeding.  Denies any wrist discomfort.  To sustain a laceration to her left leg just above the kneecap.  Complains of some right knee discomfort but denies any hip pain.  No lower back discomfort.  Denies any neck pain.        Past Medical History:  Diagnosis Date  . CKD (chronic kidney disease), stage III ([place])   . Hypertension   . Murmur   . Onychomycosis   . Osteoporosis   . Peripheral arterial disease (HCC)    OF LEFT EXTREMITY   . Primary osteoarthritis of right knee 05/26/2017  . Seasonal allergies   . Vitamin D deficiency     Patient Active Problem List   Diagnosis Date Noted  . Aortic stenosis 01/20/2020  . Primary osteoarthritis of right knee 05/26/2017    Past Surgical History:  Procedure Laterality Date  . KNEE SURGERY Left      OB History   No obstetric history on file.     No family history on file.  Social History   Tobacco Use  . Smoking status: Never Smoker  . Smokeless tobacco: Never Used  Vaping Use  . Vaping Use: Never used  Substance Use Topics  . Alcohol use: Never  . Drug use: Never    Home Medications Prior to Admission medications   Medication Sig Start Date End Date Taking? Authorizing Provider  acetaminophen (TYLENOL) 650 MG CR tablet Take 650 mg by mouth in the morning and at bedtime.    [provider]  amLODipine (NORVASC) 10 MG tablet Take 10 mg by mouth daily.  03/18/17   [provider]  aspirin EC 81 MG tablet Take 81 mg by mouth daily.    [provider]  Calcium Carb-Cholecalciferol (CALCIUM 600+D3 PO) Take by mouth.     [provider]  losartan-hydrochlorothiazide (HYZAAR) 100-12.5 MG tablet Take 1 tablet by mouth daily.  05/06/17   [provider]  pravastatin (PRAVACHOL) 40 MG tablet Take 40 mg by mouth daily.    [provider]    Allergies    Tylenol [acetaminophen]  Review of Systems   Review of Systems  All other systems reviewed and are negative.   Physical Exam Updated Vital Signs BP (!) 152/64   Pulse 82   Temp 98.2 F (36.8 C) (Oral)   Resp 18   SpO2 98%   Physical Exam Vitals and nursing note reviewed.  Constitutional:      General: She is not in acute distress.    Appearance: Normal appearance. She is well-developed. She is not toxic-appearing.  HENT:     Head: Normocephalic and atraumatic.  Eyes:     General: Lids are normal.     Conjunctiva/sclera: Conjunctivae normal.     Pupils: Pupils are equal, round, and reactive to light.  Neck:     Thyroid: No thyroid mass.     Trachea: No tracheal deviation.  Cardiovascular:     Rate and Rhythm: Normal rate and regular rhythm.     Heart sounds: Normal heart sounds. No murmur heard.  No gallop.   Pulmonary:     Effort: Pulmonary effort is normal. No respiratory distress.     Breath sounds: Normal breath sounds. No stridor. No decreased breath sounds, wheezing, rhonchi or rales.  Abdominal:     General: Bowel sounds are normal. There is no distension.     Palpations: Abdomen is soft.     Tenderness: There is no abdominal tenderness. There is no rebound.  Musculoskeletal:        General: No tenderness. Normal range of motion.     Cervical back: Normal range of motion and neck supple.       Legs:     Comments: No pain with movement of the pelvis  Skin:    General: Skin is warm and dry.     Findings: No abrasion or rash.  Neurological:     General: No focal deficit present.     Mental Status: She is alert and oriented to person, place, and time. Mental status is at baseline.     GCS: GCS eye  subscore is 4. GCS verbal subscore is 5. GCS motor subscore is 6.     Cranial Nerves: No cranial nerve deficit.     Sensory: No sensory deficit.  Psychiatric:        Attention and Perception: Attention normal.        Speech: Speech normal.        Behavior: Behavior normal.     ED Results / Procedures / Treatments   Labs (all labs ordered are listed, but only abnormal results are displayed) Labs Reviewed - No data to display  EKG None  Radiology No results found.  Procedures Procedures   Medications Ordered in ED Medications - No data to display  ED Course  I have reviewed the triage vital signs and the nursing notes.  Pertinent labs & imaging results that were available during my care of the patient were reviewed by me and considered in my medical decision making (see chart for details).    MDM Rules/Calculators/A&P                          Patient's imaging reviewed and no acute findings here.  Laceration repaired by physician assistant.  Will discharge home Final Clinical Impression(s) / ED Diagnoses Final diagnoses:  None    Rx / DC Orders ED Discharge Orders    None       Lacretia Leigh, MD 12/23/20 1404

## 2020-12-23 NOTE — Discharge Instructions (Addendum)
Sutures out in 7 days

## 2021-01-03 ENCOUNTER — Ambulatory Visit (HOSPITAL_COMMUNITY): Payer: Medicare (Managed Care) | Attending: Cardiology

## 2021-01-03 DIAGNOSIS — I350 Nonrheumatic aortic (valve) stenosis: Secondary | ICD-10-CM | POA: Diagnosis present

## 2021-01-03 LAB — ECHOCARDIOGRAM COMPLETE
AR max vel: 0.55 cm2
AV Area VTI: 0.56 cm2
AV Area mean vel: 0.56 cm2
AV Mean grad: 54 mmHg
AV Peak grad: 90.6 mmHg
Ao pk vel: 4.76 m/s
Area-P 1/2: 3.24 cm2
S' Lateral: 2.7 cm

## 2021-01-09 ENCOUNTER — Encounter: Payer: Self-pay | Admitting: Cardiovascular Disease

## 2021-01-09 NOTE — Progress Notes (Signed)
Cardiology Office Note:    Date:  01/10/2021   ID:  Krystal Maddox, DOB 07/01/1941, MRN [id number]  PCP:  Prince Solian, MD  Cardiologist:  Mahek Schlesinger   Electrophysiologist:  None   Referring MD: Prince Solian, MD   Chief Complaint  Patient presents with  . Aortic Stenosis     Previous notes:    Krystal Maddox is a 80 y.o. female with a hx of hypertension, heart murmur, shortness of breath, stage III chronic kidney disease.  She was recently found to have aortic stenosis.  We are asked to evaluate her for further evaluation and management of her aortic stenosis.  Seen with interpreter, Raynaldo Opitz   She was previously seen by Dr. Elsworth Soho.  More recently she has been [facility]. Echocardiogram from November 18, 2019 reveals normal left ventricular systolic function.  She has grade 1 diastolic dysfunction.  She has severe calcification and thickening of the aortic valve.  There is a mean aortic valve gradient of 36 mmHg with a peak gradient of 71.7 mmHg.  She has had a heart murmur for years .  Coughing recently . Has cp when she cough.  Coughs only rarely   No knowledge of rheumatic fever as a child   Has had both covid vaccines.   Has DOE , no cp with exertion  Unable to walk very far due to DOE .    July 24, 2020: Krystal Maddox is a 80 yo [place] .  She  is seen today for follow up of her AS, dyspnea, CKD. She has moderate - severe AS .  Echo from March, 2021 shows normal LV function.  Grade 1 DD.  Peak AS gradient of 71 with  Mean AS gradient of 36.   Is not very active Still has some shortness of breath but says she is feeling better.   Jan 10, 2021:  Krystal Maddox is a 80 yo [place].  Seen with Interpreter, Diah Siu.   She has a hx of aortic stenosis Recent echo shows worsening AV gradient Has had a syncopal episode Some DOE No CP   Past Medical History:  Diagnosis Date  . CKD (chronic kidney disease), stage III ([place])   . Hypertension   . Murmur    . Onychomycosis   . Osteoporosis   . Peripheral arterial disease (HCC)    OF LEFT EXTREMITY   . Primary osteoarthritis of right knee 05/26/2017  . Seasonal allergies   . Vitamin D deficiency     Past Surgical History:  Procedure Laterality Date  . KNEE SURGERY Left     Current Medications: Current Meds  Medication Sig  . acetaminophen (TYLENOL) 650 MG CR tablet Take 650 mg by mouth in the morning and at bedtime.  Marland Kitchen amLODipine (NORVASC) 10 MG tablet Take 10 mg by mouth daily.   Marland Kitchen aspirin EC 81 MG tablet Take 81 mg by mouth daily.  . Calcium Carb-Cholecalciferol (CALCIUM 600+D3 PO) Take by mouth.  . losartan-hydrochlorothiazide (HYZAAR) 100-12.5 MG tablet Take 1 tablet by mouth daily.   . pravastatin (PRAVACHOL) 40 MG tablet Take 40 mg by mouth daily.     Allergies:   Tylenol [acetaminophen]   Social History   Socioeconomic History  . Marital status: Unknown    Spouse name: Not on file  . Number of children: Not on file  . Years of education: Not on file  . Highest education level: Not on file  Occupational History  .  Not on file  Tobacco Use  . Smoking status: Never Smoker  . Smokeless tobacco: Never Used  Vaping Use  . Vaping Use: Never used  Substance and Sexual Activity  . Alcohol use: Never  . Drug use: Never  . Sexual activity: Not on file  Other Topics Concern  . Not on file  Social History Narrative  . Not on file   Social Determinants of Health   Financial Resource Strain: Not on file  Food Insecurity: Not on file  Transportation Needs: Not on file  Physical Activity: Not on file  Stress: Not on file  Social Connections: Not on file     Family History: The patient's family history is not on file.  Family is in [place] .  She is a  Leisure centre manager   No known medical histyory.   Family did not go to the doctor   ROS:   Please see the history of present illness.     All other systems reviewed and are negative.  EKGs/Labs/Other Studies Reviewed:     The following studies were reviewed today:   EKG:    Jan 10, 2021: Normal sinus rhythm at 77.  Occasional premature atrial contractions.  Recent Labs: 01/10/2021: ALT 12; BUN 14; Creatinine, Ser 1.09; Hemoglobin WILL FOLLOW; Platelets WILL FOLLOW; Potassium 4.3; Sodium 141; TSH 2.210  Recent Lipid Panel No results found for: CHOL, TRIG, HDL, CHOLHDL, VLDL, LDLCALC, LDLDIRECT  Physical Exam:    Physical Exam: Blood pressure 112/60, pulse 77, height '4\' 11"'$  (1.499 m), weight 144 lb (65.3 kg), SpO2 96 %.  GEN:  Well nourished, well developed in no acute distress HEENT: Normal NECK: No JVD; No carotid bruits,  Radiation of her systolic murmjur into her carotids.  LYMPHATICS: No lymphadenopathy CARDIAC: RRR, [id number] systolic murmur  RESPIRATORY:  Clear to auscultation without rales, wheezing or rhonchi  ABDOMEN: Soft, non-tender, non-distended MUSCULOSKELETAL:  No edema; No deformity  SKIN: Warm and dry NEUROLOGIC:  Alert and oriented x 3   ASSESSMENT:    1. Aortic valve stenosis, etiology of cardiac valve disease unspecified    PLAN:       Aortic stenosis: Her aortic stenosis is now severe.  She is having fatigue and some shortness of breath.  She is passed out on 1 occasion.    Will refer her to valve clinic for consideration of TAVR .   Will get some basic labs ( CBC, BMP, TSH, liver enz.  She will follow-up with an APP in 6 months.     Medication Adjustments/Labs and Tests Ordered: Current medicines are reviewed at length with the patient today.  Concerns regarding medicines are outlined above.  Orders Placed This Encounter  Procedures  . Hepatic function panel  . Basic metabolic panel  . TSH  . CBC  . Ambulatory referral to Cardiology  . EKG 12-Lead   No orders of the defined types were placed in this encounter.   Patient Instructions  Medication Instructions:  Your physician recommends that you continue on your current medications as directed. Please refer  to the Current Medication list given to you today.  *If you need a refill on your cardiac medications before your next appointment, please call your pharmacy*   Lab Work: Today: TSH, BMET, Hepatic panel, cbc  If you have labs (blood work) drawn today and your tests are completely normal, you will receive your results only by: Marland Kitchen MyChart Message (if you have MyChart) OR . A paper copy in the  mail If you have any lab test that is abnormal or we need to change your treatment, we will call you to review the results.   Testing/Procedures: none   Follow-Up: At [facility], you and your health needs are our priority.  As part of our continuing mission to provide you with exceptional heart care, we have created designated Provider Care Teams.  These Care Teams include your primary Cardiologist (physician) and Advanced Practice Providers (APPs -  Physician Assistants and Nurse Practitioners) who all work together to provide you with the care you need, when you need it.  We recommend signing up for the patient portal called "MyChart".  Sign up information is provided on this After Visit Summary.  MyChart is used to connect with patients for Virtual Visits (Telemedicine).  Patients are able to view lab/test results, encounter notes, upcoming appointments, etc.  Non-urgent messages can be sent to your provider as well.   To learn more about what you can do with MyChart, go to [email or web address].    Your next appointment:   01/18/2021 at 9:30  The format for your next appointment:   In Person  Provider:   Lauree Chandler, MD      Signed, Mertie Moores, MD  01/10/2021 5:16 PM    [place]

## 2021-01-10 ENCOUNTER — Ambulatory Visit (INDEPENDENT_AMBULATORY_CARE_PROVIDER_SITE_OTHER): Payer: Medicare (Managed Care) | Admitting: Cardiovascular Disease

## 2021-01-10 ENCOUNTER — Other Ambulatory Visit: Payer: Self-pay

## 2021-01-10 ENCOUNTER — Encounter: Payer: Self-pay | Admitting: Cardiovascular Disease

## 2021-01-10 VITALS — BP 112/60 | HR 77 | Ht 59.0 in | Wt 144.0 lb

## 2021-01-10 DIAGNOSIS — I350 Nonrheumatic aortic (valve) stenosis: Secondary | ICD-10-CM | POA: Diagnosis not present

## 2021-01-10 LAB — CBC
Hematocrit: 32.2 % — ABNORMAL LOW (ref 34.0–46.6)
Hemoglobin: 9.8 g/dL — ABNORMAL LOW (ref 11.1–15.9)
MCH: 24.2 pg — ABNORMAL LOW (ref 26.6–33.0)
MCHC: 30.4 g/dL — ABNORMAL LOW (ref 31.5–35.7)
MCV: 80 fL (ref 79–97)
Platelets: 401 10*3/uL (ref 150–450)
RBC: 4.05 x10E6/uL (ref 3.77–5.28)
RDW: 17.1 % — ABNORMAL HIGH (ref 11.7–15.4)
WBC: 12 10*3/uL — ABNORMAL HIGH (ref 3.4–10.8)

## 2021-01-10 LAB — HEPATIC FUNCTION PANEL
ALT: 12 IU/L (ref 0–32)
AST: 15 IU/L (ref 0–40)
Albumin: 4.1 g/dL (ref 3.6–4.6)
Alkaline Phosphatase: 88 IU/L (ref 44–121)
Bilirubin Total: 0.2 mg/dL (ref 0.0–1.2)
Bilirubin, Direct: <0.1 mg/dL (ref 0.00–0.40)
Total Protein: 8.1 g/dL (ref 6.0–8.5)

## 2021-01-10 LAB — BASIC METABOLIC PANEL
BUN/Creatinine Ratio: 13 (ref 12–28)
BUN: 14 mg/dL (ref 8–27)
CO2: 22 mmol/L (ref 20–29)
Calcium: 9.8 mg/dL (ref 8.7–10.3)
Chloride: 102 mmol/L (ref 96–106)
Creatinine, Ser: 1.09 mg/dL — ABNORMAL HIGH (ref 0.57–1.00)
Glucose: 108 mg/dL — ABNORMAL HIGH (ref 65–99)
Potassium: 4.3 mmol/L (ref 3.5–5.2)
Sodium: 141 mmol/L (ref 134–144)
eGFR: 51 mL/min/{1.73_m2} — ABNORMAL LOW (ref >59)

## 2021-01-10 LAB — TSH: TSH: 2.21 u[IU]/mL (ref 0.450–4.500)

## 2021-01-10 NOTE — Patient Instructions (Signed)
Medication Instructions:  Your physician recommends that you continue on your current medications as directed. Please refer to the Current Medication list given to you today.  *If you need a refill on your cardiac medications before your next appointment, please call your pharmacy*   Lab Work: Today: TSH, BMET, Hepatic panel, cbc  If you have labs (blood work) drawn today and your tests are completely normal, you will receive your results only by: Marland Kitchen MyChart Message (if you have MyChart) OR . A paper copy in the mail If you have any lab test that is abnormal or we need to change your treatment, we will call you to review the results.   Testing/Procedures: none   Follow-Up: At [facility], you and your health needs are our priority.  As part of our continuing mission to provide you with exceptional heart care, we have created designated Provider Care Teams.  These Care Teams include your primary Cardiologist (physician) and Advanced Practice Providers (APPs -  Physician Assistants and Nurse Practitioners) who all work together to provide you with the care you need, when you need it.  We recommend signing up for the patient portal called "MyChart".  Sign up information is provided on this After Visit Summary.  MyChart is used to connect with patients for Virtual Visits (Telemedicine).  Patients are able to view lab/test results, encounter notes, upcoming appointments, etc.  Non-urgent messages can be sent to your provider as well.   To learn more about what you can do with MyChart, go to [email or web address].    Your next appointment:   01/18/2021 at 9:30  The format for your next appointment:   In Person  Provider:   Lauree Chandler, MD

## 2021-01-18 ENCOUNTER — Institutional Professional Consult (permissible substitution): Payer: Medicare (Managed Care) | Admitting: Cardiovascular Disease

## 2021-01-18 NOTE — Progress Notes (Deleted)
Structural Heart Clinic Consult Note  No chief complaint on file.  History of Present Illness:80 yo female with history of HTN, stage 3 chronic kidney disease, PAD, arthritis and severe aortic stenosis who is here today as a new consult, referred by Dr. Acie Fredrickson, for further discussion regarding her severe aortic stenosis and possible TAVR. She is from *** and is here today with an interpretor. She does not speak [place]. She has been followed for moderate aortic stenosis by Dr. Acie Fredrickson. Echo 01/03/21 with LVEF=60-65%, mild LVH. The aortic valve is thickened and calcified with limited leaflet mobility. Mean gradient 54 mmHg, peak gradient 90 mmHg, AVA 0.56 cm2, dimensionless index 0.22. This is consistent with severe aortic stenosis.   She tells me today that she *** She lives with *** Dentist *** Retired ***  Primary Care Physician: Prince Solian, MD Primary Cardiologist: Nahser Referring Cardiologist: Nahser  Past Medical History:  Diagnosis Date  . CKD (chronic kidney disease), stage III ([place])   . Hypertension   . Murmur   . Onychomycosis   . Osteoporosis   . Peripheral arterial disease (HCC)    OF LEFT EXTREMITY   . Primary osteoarthritis of right knee 05/26/2017  . Seasonal allergies   . Vitamin D deficiency     Past Surgical History:  Procedure Laterality Date  . KNEE SURGERY Left     Current Outpatient Medications  Medication Sig Dispense Refill  . acetaminophen (TYLENOL) 650 MG CR tablet Take 650 mg by mouth in the morning and at bedtime.    Marland Kitchen amLODipine (NORVASC) 10 MG tablet Take 10 mg by mouth daily.   0  . aspirin EC 81 MG tablet Take 81 mg by mouth daily.    . Calcium Carb-Cholecalciferol (CALCIUM 600+D3 PO) Take by mouth.    . losartan-hydrochlorothiazide (HYZAAR) 100-12.5 MG tablet Take 1 tablet by mouth daily.   0  . pravastatin (PRAVACHOL) 40 MG tablet Take 40 mg by mouth daily.     No current facility-administered medications for this visit.     Allergies  Allergen Reactions  . Tylenol [Acetaminophen]     Social History   Socioeconomic History  . Marital status: Unknown    Spouse name: Not on file  . Number of children: Not on file  . Years of education: Not on file  . Highest education level: Not on file  Occupational History  . Not on file  Tobacco Use  . Smoking status: Never Smoker  . Smokeless tobacco: Never Used  Vaping Use  . Vaping Use: Never used  Substance and Sexual Activity  . Alcohol use: Never  . Drug use: Never  . Sexual activity: Not on file  Other Topics Concern  . Not on file  Social History Narrative  . Not on file   Social Determinants of Health   Financial Resource Strain: Not on file  Food Insecurity: Not on file  Transportation Needs: Not on file  Physical Activity: Not on file  Stress: Not on file  Social Connections: Not on file  Intimate Partner Violence: Not on file    No family history on file.  Review of Systems:  As stated in the HPI and otherwise negative.   There were no vitals taken for this visit.  Physical Examination: General: Well developed, well nourished, NAD  HEENT: OP clear, mucus membranes moist  SKIN: warm, dry. No rashes. Neuro: No focal deficits  Musculoskeletal: Muscle strength 5/5 all ext  Psychiatric: Mood and affect normal  Neck: No JVD, no carotid bruits, no thyromegaly, no lymphadenopathy.  Lungs:Clear bilaterally, no wheezes, rhonci, crackles Cardiovascular: Regular rate and rhythm. *** Loud, harsh, late peaking systolic murmur.  Abdomen:Soft. Bowel sounds present. Non-tender.  Extremities: *** No lower extremity edema. Pulses are 2 + in the bilateral DP/PT.  EKG:  EKG {ACTION; IS/IS [id number] ordered today. The ekg ordered today demonstrates ***  Echo 01/03/21:   1. Left ventricular ejection fraction, by estimation, is 60 to 65%. The  left ventricle has normal function. The left ventricle has no regional  wall motion  abnormalities. There is mild concentric left ventricular  hypertrophy. Left ventricular diastolic  parameters are consistent with Grade I diastolic dysfunction (impaired  relaxation). The average left ventricular global longitudinal strain is  -17.2 %. The global longitudinal strain is normal.  2. Right ventricular systolic function is normal. The right ventricular  size is normal. There is mildly elevated pulmonary artery systolic  pressure.  3. Left atrial size was mild to moderately dilated.  4. Right atrial size was mildly dilated.  5. The mitral valve is normal in structure. Trivial mitral valve  regurgitation.  6. The aortic valve is calcified. There is severe calcifcation of the  aortic valve. There is severe thickening of the aortic valve. Aortic valve  regurgitation is trivial. Severe aortic valve stenosis. Aortic valve area,  by VTI measures 0.56 cm. Aortic  valve mean gradient measures 54.0 mmHg. Aortic valve Vmax measures 4.76  m/s.  7. The inferior vena cava is normal in size with <50% respiratory  variability, suggesting right atrial pressure of 8 mmHg.   Comparison(s): Changes from prior study are noted. Prior study noted  severe aortic stenosis, but gradients have increased since then, supports  worsening aortic stenosis.   FINDINGS  Left Ventricle: Left ventricular ejection fraction, by estimation, is 60  to 65%. The left ventricle has normal function. The left ventricle has no  regional wall motion abnormalities. The average left ventricular global  longitudinal strain is -17.2 %.  The global longitudinal strain is normal. The left ventricular internal  cavity size was normal in size. There is mild concentric left ventricular  hypertrophy. Left ventricular diastolic parameters are consistent with  Grade I diastolic dysfunction  (impaired relaxation).   Right Ventricle: The right ventricular size is normal. No increase in  right ventricular wall  thickness. Right ventricular systolic function is  normal. There is mildly elevated pulmonary artery systolic pressure. The  tricuspid regurgitant velocity is 2.82  m/s, and with an assumed right atrial pressure of 8 mmHg, the estimated  right ventricular systolic pressure is [id number].   Left Atrium: Left atrial size was mild to moderately dilated.   Right Atrium: Right atrial size was mildly dilated.   Pericardium: Trivial pericardial effusion is present. Presence of  pericardial fat pad.   Mitral Valve: The mitral valve is normal in structure. Mild to moderate  mitral annular calcification. Trivial mitral valve regurgitation.   Tricuspid Valve: The tricuspid valve is normal in structure. Tricuspid  valve regurgitation is trivial.   Aortic Valve: The aortic valve is calcified. There is severe calcifcation  of the aortic valve. There is severe thickening of the aortic valve.  Aortic valve regurgitation is trivial. Severe aortic stenosis is present.  Aortic valve mean gradient measures  54.0 mmHg. Aortic valve peak gradient measures 90.6 mmHg. Aortic valve  area, by VTI measures 0.56 cm.   Pulmonic Valve: The pulmonic valve was grossly normal. Pulmonic valve  regurgitation is trivial. No evidence of pulmonic stenosis.   Aorta: The aortic root, ascending aorta, aortic arch and descending aorta  are all structurally normal, with no evidence of dilitation or  obstruction.   Venous: The inferior vena cava is normal in size with less than 50%  respiratory variability, suggesting right atrial pressure of 8 mmHg.   IAS/Shunts: The atrial septum is grossly normal.     LEFT VENTRICLE  PLAX 2D  LVIDd:     3.60 cm Diastology  LVIDs:     2.70 cm LV e' medial:  5.87 cm/s  LV PW:     1.30 cm LV E/e' medial: 17.0  LV IVS:    1.20 cm LV e' lateral:  6.96 cm/s  LVOT diam:   1.80 cm LV E/e' lateral: 14.4  LV SV:     73  LV SV Index:  45    2D  Longitudinal Strain  LVOT Area:   2.54 cm 2D Strain GLS (A2C):  -18.3 %             2D Strain GLS (A3C):  -14.9 %             2D Strain GLS (A4C):  -18.2 %             2D Strain GLS Avg:   -17.2 %   RIGHT VENTRICLE  RV Basal diam: 3.30 cm  RV S prime:   12.20 cm/s  TAPSE (M-mode): 1.9 cm  RVSP:      34.8 mmHg   LEFT ATRIUM       Index    RIGHT ATRIUM      Index  LA diam:    3.80 cm 2.35 cm/m RA Pressure: 3.00 mmHg  LA Vol (A2C):  40.7 ml 25.17 ml/m RA Area:   14.10 cm  LA Vol (A4C):  48.0 ml 29.68 ml/m RA Volume:  35.50 ml 21.95 ml/m  LA Biplane Vol: 40.4 ml 24.98 ml/m  AORTIC VALVE  AV Area (Vmax):  0.55 cm  AV Area (Vmean):  0.56 cm  AV Area (VTI):   0.56 cm  AV Vmax:      476.00 cm/s  AV Vmean:     346.000 cm/s  AV VTI:      1.300 m  AV Peak Grad:   90.6 mmHg  AV Mean Grad:   54.0 mmHg  LVOT Vmax:     102.00 cm/s  LVOT Vmean:    75.700 cm/s  LVOT VTI:     0.285 m  LVOT/AV VTI ratio: 0.22    AORTA  Ao Root diam: 2.80 cm  Ao Asc diam: 3.40 cm   MITRAL VALVE        TRICUSPID VALVE  MV Area (PHT):       TR Peak grad:  31.8 mmHg  MV Decel Time:       TR Vmax:    282.00 cm/s  MV E velocity: 100.00 cm/s Estimated RAP: 3.00 mmHg  MV A velocity: 147.00 cm/s RVSP:      34.8 mmHg  MV E/A ratio: 0.68               SHUNTS               Systemic VTI: 0.29 m               Systemic Diam: 1.80 cm   Recent Labs: 01/10/2021: ALT 12; BUN 14; Creatinine, Ser 1.09; Hemoglobin 9.8; Platelets 401; Potassium 4.3; Sodium  141; TSH 2.210   Wt Readings from Last 3 Encounters:  01/10/21 144 lb (65.3 kg)  07/24/20 146 lb 12.8 oz (66.6 kg)  03/06/20 147 lb (66.7 kg)     Other studies Reviewed: Additional studies/ records that were reviewed today include: ***. Review of the above  records demonstrates: ***  STS Risk Score: ***  Assessment and Plan:   1. Severe Aortic Valve Stenosis: She has severe, stage D aortic valve stenosis. I have personally reviewed the echo images. The aortic valve is thickened, calcified with limited leaflet mobility. I think she would benefit from AVR. Given advanced age, she is not a good candidate for conventional AVR by surgical approach. I think she may be a good candidate for TAVR.   I have reviewed the natural history of aortic stenosis with the patient and their family members  who are present today. We have discussed the limitations of medical therapy and the poor prognosis associated with symptomatic aortic stenosis. We have reviewed potential treatment options, including palliative medical therapy, conventional surgical aortic valve replacement, and transcatheter aortic valve replacement. We discussed treatment options in the context of the patient's specific comorbid medical conditions.   She would like to proceed with planning for TAVR. I will arrange a right and left heart catheterization at [facility] ***. Risks and benefits of the cath procedure and the valve procedure are reviewed with the patient. After the cath, *** will have a cardiac CT, CTA of the chest/abdomen and pelvis, carotid artery dopplers, PT assessment and will then be referred to see one of the CT surgeons on our TAVR team.      Current medicines are reviewed at length with the patient today.  The patient {ACTIONS; HAS/DOES NOT HAVE:19233} concerns regarding medicines.  The following changes have been made:  {PLAN; NO CHANGE:13088:s}  Labs/ tests ordered today include: *** No orders of the defined types were placed in this encounter.    Disposition:   FU with *** in {gen number [id number] {TIME; UNITS DAY/WEEK/MONTH:19136}   Signed, Lauree Chandler, MD 01/18/2021 [address], [place], [place]  [address] Phone:  [phone]; Fax: [phone]

## 2021-02-20 ENCOUNTER — Telehealth: Payer: Self-pay | Admitting: Cardiovascular Disease

## 2021-02-20 NOTE — Telephone Encounter (Signed)
Jeris Penta , Patient Advocate, [facility] at [facility] called on behalf of this patient. He wanted to re-schedule the appointment for the patient, as she does not speak [place].  If he is not available at his office [phone], provided), Please reach out to him at  [phone], which his cell phone

## 2021-02-20 NOTE — Telephone Encounter (Signed)
Spoke with advocate and advised Dr Camillia Herter nurse is out of the office today but will forward request to reschedule appointment and she will contact him when she returns to the office.  Advocate verbalizes understanding and thanked Therapist, sports for the callback,

## 2021-02-21 NOTE — Telephone Encounter (Signed)
Appt r/s with Dr. Angelena Form on 6/29 @ 8am. The pt advocate will try to be there with her for interpretation but if he cannot we will have to use the phone interpreter

## 2021-02-26 NOTE — Telephone Encounter (Signed)
I left a VM for the pt's advocate that her apt has been rescheduled to 7/25.

## 2021-02-26 NOTE — Telephone Encounter (Signed)
[facility] contacted me in regards to 6/29 apt.  They advised that all apts for this pt have to be arranged through their service.  The current apt on 6/29 will not work due to scheduling conflict.  Next available apt 7/25 at 9:30 AM.  Leda Gauze at [facility] can be reached at [phone] for all future scheduling needs.

## 2021-03-06 ENCOUNTER — Ambulatory Visit: Payer: Medicare (Managed Care) | Admitting: Cardiovascular Disease

## 2021-04-01 ENCOUNTER — Ambulatory Visit: Payer: Medicare (Managed Care) | Admitting: Cardiovascular Disease

## 2021-04-01 NOTE — Progress Notes (Deleted)
Structural Heart Clinic Consult Note  No chief complaint on file.  History of Present Illness: 80 yo female with history of HTN, stage 3 chronic kidney disease, PAD and severe aortic stenosis who is here today as a new patient, referred by Dr. Acie Fredrickson, for further discussion of her aortic stenosis and possible TAVR. She has been followed for moderate aortic stenosis since 2021. She was recently seen by Dr. Acie Fredrickson and reported a syncopal event and worsening dyspnea on exertion. Echo 01/03/21 with LVEF=60-65%. The aortic valve is thickened and calcified with limited leaflet mobility. There is severe aortic stenosis with mean gradient of 54 mmHg, peak gradient 90.6 mmHg, AVA 0.55 cm2, dimensionless index 0.22.   She tells me today that she *** She is Leisure centre manager. She is seen today with an interpretor. She lives with *** She does not work. *** Dentist ***  Primary Care Physician: Prince Solian, MD Primary Cardiologist: Nahser Referring Cardiologist: Nahser  Past Medical History:  Diagnosis Date   CKD (chronic kidney disease), stage III ([place])    Hypertension    Murmur    Onychomycosis    Osteoporosis    Peripheral arterial disease ([place])    OF LEFT EXTREMITY    Primary osteoarthritis of right knee 05/26/2017   Seasonal allergies    Vitamin D deficiency     Past Surgical History:  Procedure Laterality Date   KNEE SURGERY Left     Current Outpatient Medications  Medication Sig Dispense Refill   acetaminophen (TYLENOL) 650 MG CR tablet Take 650 mg by mouth in the morning and at bedtime.     amLODipine (NORVASC) 10 MG tablet Take 10 mg by mouth daily.   0   aspirin EC 81 MG tablet Take 81 mg by mouth daily.     Calcium Carb-Cholecalciferol (CALCIUM 600+D3 PO) Take by mouth.     losartan-hydrochlorothiazide (HYZAAR) 100-12.5 MG tablet Take 1 tablet by mouth daily.   0   pravastatin (PRAVACHOL) 40 MG tablet Take 40 mg by mouth daily.     No current facility-administered medications  for this visit.    Allergies  Allergen Reactions   Tylenol [Acetaminophen]     Social History   Socioeconomic History   Marital status: Unknown    Spouse name: Not on file   Number of children: Not on file   Years of education: Not on file   Highest education level: Not on file  Occupational History   Not on file  Tobacco Use   Smoking status: Never   Smokeless tobacco: Never  Vaping Use   Vaping Use: Never used  Substance and Sexual Activity   Alcohol use: Never   Drug use: Never   Sexual activity: Not on file  Other Topics Concern   Not on file  Social History Narrative   Not on file   Social Determinants of Health   Financial Resource Strain: Not on file  Food Insecurity: Not on file  Transportation Needs: Not on file  Physical Activity: Not on file  Stress: Not on file  Social Connections: Not on file  Intimate Partner Violence: Not on file    No family history on file.  Review of Systems:  As stated in the HPI and otherwise negative.   There were no vitals taken for this visit.  Physical Examination: General: Well developed, well nourished, NAD  HEENT: OP clear, mucus membranes moist  SKIN: warm, dry. No rashes. Neuro: No focal deficits  Musculoskeletal: Muscle strength 5/5 all  ext  Psychiatric: Mood and affect normal  Neck: No JVD, no carotid bruits, no thyromegaly, no lymphadenopathy.  Lungs:Clear bilaterally, no wheezes, rhonci, crackles Cardiovascular: Regular rate and rhythm. *** Loud, harsh, late peaking systolic murmur.  Abdomen:Soft. Bowel sounds present. Non-tender.  Extremities: *** No lower extremity edema. Pulses are 2 + in the bilateral DP/PT.  EKG:  EKG {ACTION; IS/IS GI:[id number] ordered today. The ekg ordered today demonstrates ***  Echo April 2022:   1. Left ventricular ejection fraction, by estimation, is 60 to 65%. The  left ventricle has normal function. The left ventricle has no regional  wall motion abnormalities. There  is mild concentric left ventricular  hypertrophy. Left ventricular diastolic  parameters are consistent with Grade I diastolic dysfunction (impaired  relaxation). The average left ventricular global longitudinal strain is  -17.2 %. The global longitudinal strain is normal.   2. Right ventricular systolic function is normal. The right ventricular  size is normal. There is mildly elevated pulmonary artery systolic  pressure.   3. Left atrial size was mild to moderately dilated.   4. Right atrial size was mildly dilated.   5. The mitral valve is normal in structure. Trivial mitral valve  regurgitation.   6. The aortic valve is calcified. There is severe calcifcation of the  aortic valve. There is severe thickening of the aortic valve. Aortic valve  regurgitation is trivial. Severe aortic valve stenosis. Aortic valve area,  by VTI measures 0.56 cm. Aortic  valve mean gradient measures 54.0 mmHg. Aortic valve Vmax measures 4.76  m/s.   7. The inferior vena cava is normal in size with <50% respiratory  variability, suggesting right atrial pressure of 8 mmHg.   Comparison(s): Changes from prior study are noted. Prior study noted  severe aortic stenosis, but gradients have increased since then, supports  worsening aortic stenosis.   FINDINGS   Left Ventricle: Left ventricular ejection fraction, by estimation, is 60  to 65%. The left ventricle has normal function. The left ventricle has no  regional wall motion abnormalities. The average left ventricular global  longitudinal strain is -17.2 %.  The global longitudinal strain is normal. The left ventricular internal  cavity size was normal in size. There is mild concentric left ventricular  hypertrophy. Left ventricular diastolic parameters are consistent with  Grade I diastolic dysfunction  (impaired relaxation).   Right Ventricle: The right ventricular size is normal. No increase in  right ventricular wall thickness. Right ventricular  systolic function is  normal. There is mildly elevated pulmonary artery systolic pressure. The  tricuspid regurgitant velocity is 2.82   m/s, and with an assumed right atrial pressure of 8 mmHg, the estimated  right ventricular systolic pressure is [id number] mmHg.   Left Atrium: Left atrial size was mild to moderately dilated.   Right Atrium: Right atrial size was mildly dilated.   Pericardium: Trivial pericardial effusion is present. Presence of  pericardial fat pad.   Mitral Valve: The mitral valve is normal in structure. Mild to moderate  mitral annular calcification. Trivial mitral valve regurgitation.   Tricuspid Valve: The tricuspid valve is normal in structure. Tricuspid  valve regurgitation is trivial.   Aortic Valve: The aortic valve is calcified. There is severe calcifcation  of the aortic valve. There is severe thickening of the aortic valve.  Aortic valve regurgitation is trivial. Severe aortic stenosis is present.  Aortic valve mean gradient measures  54.0 mmHg. Aortic valve peak gradient measures 90.6 mmHg. Aortic valve  area, by  VTI measures 0.56 cm.   Pulmonic Valve: The pulmonic valve was grossly normal. Pulmonic valve  regurgitation is trivial. No evidence of pulmonic stenosis.   Aorta: The aortic root, ascending aorta, aortic arch and descending aorta  are all structurally normal, with no evidence of dilitation or  obstruction.   Venous: The inferior vena cava is normal in size with less than 50%  respiratory variability, suggesting right atrial pressure of 8 mmHg.   IAS/Shunts: The atrial septum is grossly normal.      LEFT VENTRICLE  PLAX 2D  LVIDd:         3.60 cm  Diastology  LVIDs:         2.70 cm  LV e' medial:    5.87 cm/s  LV PW:         1.30 cm  LV E/e' medial:  17.0  LV IVS:        1.20 cm  LV e' lateral:   6.96 cm/s  LVOT diam:     1.80 cm  LV E/e' lateral: 14.4  LV SV:         73  LV SV Index:   45       2D Longitudinal Strain  LVOT Area:      2.54 cm 2D Strain GLS (A2C):   -18.3 %                          2D Strain GLS (A3C):   -14.9 %                          2D Strain GLS (A4C):   -18.2 %                          2D Strain GLS Avg:     -17.2 %   RIGHT VENTRICLE  RV Basal diam:  3.30 cm  RV S prime:     12.20 cm/s  TAPSE (M-mode): 1.9 cm  RVSP:           34.8 mmHg   LEFT ATRIUM             Index       RIGHT ATRIUM           Index  LA diam:        3.80 cm 2.35 cm/m  RA Pressure: 3.00 mmHg  LA Vol (A2C):   40.7 ml 25.17 ml/m RA Area:     14.10 cm  LA Vol (A4C):   48.0 ml 29.68 ml/m RA Volume:   35.50 ml  21.95 ml/m  LA Biplane Vol: 40.4 ml 24.98 ml/m   AORTIC VALVE  AV Area (Vmax):    0.55 cm  AV Area (Vmean):   0.56 cm  AV Area (VTI):     0.56 cm  AV Vmax:           476.00 cm/s  AV Vmean:          346.000 cm/s  AV VTI:            1.300 m  AV Peak Grad:      90.6 mmHg  AV Mean Grad:      54.0 mmHg  LVOT Vmax:         102.00 cm/s  LVOT Vmean:        75.700 cm/s  LVOT VTI:  0.285 m  LVOT/AV VTI ratio: 0.22     AORTA  Ao Root diam: 2.80 cm  Ao Asc diam:  3.40 cm   MITRAL VALVE                TRICUSPID VALVE  MV Area (PHT):              TR Peak grad:   31.8 mmHg  MV Decel Time:              TR Vmax:        282.00 cm/s  MV E velocity: 100.00 cm/s  Estimated RAP:  3.00 mmHg  MV A velocity: 147.00 cm/s  RVSP:           34.8 mmHg  MV E/A ratio:  0.68                              SHUNTS                              Systemic VTI:  0.29 m                              Systemic Diam: 1.80 cm   Recent Labs: 01/10/2021: ALT 12; BUN 14; Creatinine, Ser 1.09; Hemoglobin 9.8; Platelets 401; Potassium 4.3; Sodium 141; TSH 2.210    Wt Readings from Last 3 Encounters:  01/10/21 144 lb (65.3 kg)  07/24/20 146 lb 12.8 oz (66.6 kg)  03/06/20 147 lb (66.7 kg)     Other studies Reviewed: Additional studies/ records that were reviewed today include: echo images, office notes Review of the above records  demonstrates: severe aortic stenosis  STS Risk Score: Procedure AVR + CAB Risk of Mortality: 4.293% Renal Failure: 2.527% Permanent stroke: 2.246% Prolonged ventilation: 11.8% DSW infection: 0.174% Reoperation: 4.1 % Morbidity or Mortality: 19.1% Short Length of Stay: 20.8% Long Length of Stay: 8.8%  Assessment and Plan:   1. Severe Aortic Valve Stenosis: She has severe, stage D aortic valve stenosis. I have personally reviewed the echo images. The aortic valve is thickened, calcified with limited leaflet mobility. I think she would benefit from AVR. Given advanced age, she is not a good candidate for conventional AVR by surgical approach. I think she may be a good candidate for TAVR.   I have reviewed the natural history of aortic stenosis with the patient and their family members  who are present today. We have discussed the limitations of medical therapy and the poor prognosis associated with symptomatic aortic stenosis. We have reviewed potential treatment options, including palliative medical therapy, conventional surgical aortic valve replacement, and transcatheter aortic valve replacement. We discussed treatment options in the context of the patient's specific comorbid medical conditions.   She would like to proceed with planning for TAVR. I will arrange a right and left heart catheterization at [facility] ***. Risks and benefits of the cath procedure and the valve procedure are reviewed with the patient. After the cath, she will have a cardiac CT, CTA of the chest/abdomen and pelvis, carotid artery dopplers, PT assessment and she will then be referred to see one of the CT surgeons on our TAVR team.      Current medicines are reviewed at length with the patient today.  The patient does not have concerns regarding medicines.  The following changes  have been made:  no change  Labs/ tests ordered today include:  No orders of the defined types were placed in this  encounter.    Disposition:   F/U with the valve team.    Signed, Lauree Chandler, MD 04/01/2021 5:47 AM    [address], [place], [place]  [address] Phone: [phone]; Fax: [phone]

## 2021-04-04 ENCOUNTER — Telehealth: Payer: Self-pay

## 2021-04-04 NOTE — Telephone Encounter (Signed)
Pt no showed for Structural Heart Evaluation on 7/25 with Dr Angelena Form.  I reviewed the pt's upcoming appointments and Leda Gauze with [facility] has arranged an office visit on 9/2 at 9:30 AM with Terie Purser.  I have left a message for Leda Gauze to contact me because this is not an appropriate provider for the pt to see for Structural Heart evaluation and this appointment will need to be rescheduled.

## 2021-04-15 ENCOUNTER — Other Ambulatory Visit: Payer: Self-pay | Admitting: Vascular Surgery

## 2021-04-15 DIAGNOSIS — Z8739 Personal history of other diseases of the musculoskeletal system and connective tissue: Secondary | ICD-10-CM

## 2021-04-25 ENCOUNTER — Encounter (INDEPENDENT_AMBULATORY_CARE_PROVIDER_SITE_OTHER): Payer: Medicare (Managed Care) | Admitting: Ophthalmology

## 2021-04-25 ENCOUNTER — Other Ambulatory Visit: Payer: Self-pay

## 2021-04-25 DIAGNOSIS — H43813 Vitreous degeneration, bilateral: Secondary | ICD-10-CM | POA: Diagnosis not present

## 2021-04-25 DIAGNOSIS — H35033 Hypertensive retinopathy, bilateral: Secondary | ICD-10-CM | POA: Diagnosis not present

## 2021-04-25 DIAGNOSIS — I10 Essential (primary) hypertension: Secondary | ICD-10-CM

## 2021-04-25 DIAGNOSIS — H2512 Age-related nuclear cataract, left eye: Secondary | ICD-10-CM

## 2021-04-25 DIAGNOSIS — H353132 Nonexudative age-related macular degeneration, bilateral, intermediate dry stage: Secondary | ICD-10-CM | POA: Diagnosis not present

## 2021-05-08 ENCOUNTER — Institutional Professional Consult (permissible substitution): Payer: Medicare (Managed Care) | Admitting: Cardiovascular Disease

## 2021-05-10 ENCOUNTER — Other Ambulatory Visit: Payer: Self-pay

## 2021-05-10 ENCOUNTER — Encounter: Payer: Self-pay | Admitting: Cardiovascular Disease

## 2021-05-10 ENCOUNTER — Ambulatory Visit (INDEPENDENT_AMBULATORY_CARE_PROVIDER_SITE_OTHER): Payer: Medicare (Managed Care) | Admitting: Cardiovascular Disease

## 2021-05-10 ENCOUNTER — Ambulatory Visit (HOSPITAL_BASED_OUTPATIENT_CLINIC_OR_DEPARTMENT_OTHER): Payer: Medicare (Managed Care) | Admitting: Family

## 2021-05-10 VITALS — BP 150/60 | HR 79 | Ht 59.0 in | Wt 140.8 lb

## 2021-05-10 DIAGNOSIS — Z01818 Encounter for other preprocedural examination: Secondary | ICD-10-CM | POA: Diagnosis not present

## 2021-05-10 DIAGNOSIS — I350 Nonrheumatic aortic (valve) stenosis: Secondary | ICD-10-CM

## 2021-05-10 DIAGNOSIS — Z01812 Encounter for preprocedural laboratory examination: Secondary | ICD-10-CM

## 2021-05-10 NOTE — Patient Instructions (Addendum)
Medication Instructions:  No changes *If you need a refill on your cardiac medications before your next appointment, please call your pharmacy*   Lab Work: Today: CBC, BMET  Testing/Procedures: Your physician has requested that you have a cardiac catheterization. Cardiac catheterization is used to diagnose and/or treat various heart conditions. Doctors may recommend this procedure for a number of different reasons. The most common reason is to evaluate chest pain. Chest pain can be a symptom of coronary artery disease (CAD), and cardiac catheterization can show whether plaque is narrowing or blocking your heart's arteries. This procedure is also used to evaluate the valves, as well as measure the blood flow and oxygen levels in different parts of your heart. For further information please visit [email or web address]. Please follow instruction sheet, as given.    Follow-Up: Per Structural Heart Team Other Instructions [facility] has been called.  Message left that you will need to be taken to the [facility] on 05/17/21, arriving by 10:00 am.  [facility] OFFICE [place], [place] [address] Dept: [phone] Loc: [phone]  Jenisha Mcclaskey  05/10/2021  You are scheduled for a Cardiac Catheterization on Friday, September 9 with Dr. Lauree Chandler.  1. Please arrive at the [facility] (Main Entrance A) at [facility]: [address] [place], [place] [address] at 10:00 AM (This time is two hours before your procedure to ensure your preparation). Free valet parking service is available.   Special note: Every effort is made to have your procedure done on time. Please understand that emergencies sometimes delay scheduled procedures.  2. Diet: Do not eat solid foods after midnight.  The patient may have clear liquids until 5am upon the day of the procedure.  3. Labs: TODAY  05/10/21  4. Medication instructions in preparation for your procedure:   Contrast Allergy: No  On the morning of your procedure, take your Aspirin 81 mg and any morning medicines NOT listed above.  You may use sips of water.  5. Plan for one night stay--bring personal belongings. 6. Bring a current list of your medications and current insurance cards. 7. You MUST have a responsible person to drive you home. 8. Someone MUST be with you the first 24 hours after you arrive home or your discharge will be delayed. 9. Please wear clothes that are easy to get on and off and wear slip-on shoes.  Thank you for allowing [place] to care for you!   -- [facility]

## 2021-05-10 NOTE — H&P (View-Only) (Signed)
[facility] Consult Note  Chief Complaint  Patient presents with   New Patient (Initial Visit)    Severe aortic stenosis   History of Present Illness: 80 yo [place] female with history of HTN, stage 3 chronic kidney disease, PAD and severe aortic stenosis who is here today as a new consult, referred by DrAcie Fredrickson, for further evaluation of her aortic stenosis and possible TAVR. She has been followed by Dr. Acie Fredrickson for moderate aortic stenosis since 2021. Echo in 2021 with aortic valve mean gradient of 36 mmHg. She was seen by Dr. Acie Fredrickson in May 2022 and reported a syncopal episode as well as worsened dyspnea on exertion. Echo 01/03/21 with LVEF=60-65%. The aortic valve is calcified and thickened. There is severe aortic stenosis. Mean gradient 54 mmHg, peak graadient 90.6 mmHg, AVA 0.56 cm2, dimensionless index 0.22.   She tells me today through an interpretor that she has progressive dyspnea with exertion. No chest pain. No LE edema. She has partial dentures and no active dental issues. She lives in [place].   Primary Care Physician: Prince Solian, MD Primary Cardiologist: Nahser Referring Cardiologist: Nahser  Past Medical History:  Diagnosis Date   CKD (chronic kidney disease), stage III ([place])    Hypertension    Murmur    Onychomycosis    Osteoporosis    Peripheral arterial disease ([place])    OF LEFT EXTREMITY    Primary osteoarthritis of right knee 05/26/2017   Seasonal allergies    Severe aortic stenosis    Vitamin D deficiency     Past Surgical History:  Procedure Laterality Date   KNEE SURGERY Left     Current Outpatient Medications  Medication Sig Dispense Refill   acetaminophen (TYLENOL) 650 MG CR tablet Take 650 mg by mouth in the morning and at bedtime.     amLODipine (NORVASC) 10 MG tablet Take 10 mg by mouth daily.   0   aspirin EC 81 MG tablet Take 81 mg by mouth daily.     Calcium Carb-Cholecalciferol (CALCIUM 600+D3 PO) Take by mouth.      losartan-hydrochlorothiazide (HYZAAR) 100-12.5 MG tablet Take 1 tablet by mouth daily.   0   pravastatin (PRAVACHOL) 40 MG tablet Take 40 mg by mouth daily.     No current facility-administered medications for this visit.    Allergies  Allergen Reactions   Tylenol [Acetaminophen]     Social History   Socioeconomic History   Marital status: Unknown    Spouse name: Not on file   Number of children: Not on file   Years of education: Not on file   Highest education level: Not on file  Occupational History   Not on file  Tobacco Use   Smoking status: Never   Smokeless tobacco: Never  Vaping Use   Vaping Use: Never used  Substance and Sexual Activity   Alcohol use: Never   Drug use: Never   Sexual activity: Not on file  Other Topics Concern   Not on file  Social History Narrative   Not on file   Social Determinants of Health   Financial Resource Strain: Not on file  Food Insecurity: Not on file  Transportation Needs: Not on file  Physical Activity: Not on file  Stress: Not on file  Social Connections: Not on file  Intimate Partner Violence: Not on file    Family History  Problem Relation Age of Onset   Hypertension Mother     Review of Systems:  As  stated in the HPI and otherwise negative.   BP (!) 150/60   Pulse 79   Ht '4\' 11"'$  (1.499 m)   Wt 140 lb 12.8 oz (63.9 kg)   SpO2 95%   BMI 28.44 kg/m   Physical Examination: General: Well developed, well nourished, NAD  HEENT: OP clear, mucus membranes moist  SKIN: warm, dry. No rashes. Neuro: No focal deficits  Musculoskeletal: Muscle strength 5/5 all ext  Psychiatric: Mood and affect normal  Neck: No JVD, no carotid bruits, no thyromegaly, no lymphadenopathy.  Lungs:Clear bilaterally, no wheezes, rhonci, crackles Cardiovascular: Regular rate and rhythm. Loud, harsh, late peaking systolic murmur.  Abdomen:Soft. Bowel sounds present. Non-tender.  Extremities: No lower extremity edema. Pulses are 2 + in the  bilateral DP/PT.  EKG:  EKG is ordered today. The ekg ordered today demonstrates Sinus  Echo 01/03/21:  1. Left ventricular ejection fraction, by estimation, is 60 to 65%. The  left ventricle has normal function. The left ventricle has no regional  wall motion abnormalities. There is mild concentric left ventricular  hypertrophy. Left ventricular diastolic  parameters are consistent with Grade I diastolic dysfunction (impaired  relaxation). The average left ventricular global longitudinal strain is  -17.2 %. The global longitudinal strain is normal.   2. Right ventricular systolic function is normal. The right ventricular  size is normal. There is mildly elevated pulmonary artery systolic  pressure.   3. Left atrial size was mild to moderately dilated.   4. Right atrial size was mildly dilated.   5. The mitral valve is normal in structure. Trivial mitral valve  regurgitation.   6. The aortic valve is calcified. There is severe calcifcation of the  aortic valve. There is severe thickening of the aortic valve. Aortic valve  regurgitation is trivial. Severe aortic valve stenosis. Aortic valve area,  by VTI measures 0.56 cm. Aortic  valve mean gradient measures 54.0 mmHg. Aortic valve Vmax measures 4.76  m/s.   7. The inferior vena cava is normal in size with <50% respiratory  variability, suggesting right atrial pressure of 8 mmHg.   Comparison(s): Changes from prior study are noted. Prior study noted  severe aortic stenosis, but gradients have increased since then, supports  worsening aortic stenosis.   FINDINGS   Left Ventricle: Left ventricular ejection fraction, by estimation, is 60  to 65%. The left ventricle has normal function. The left ventricle has no  regional wall motion abnormalities. The average left ventricular global  longitudinal strain is -17.2 %.  The global longitudinal strain is normal. The left ventricular internal  cavity size was normal in size. There is  mild concentric left ventricular  hypertrophy. Left ventricular diastolic parameters are consistent with  Grade I diastolic dysfunction  (impaired relaxation).   Right Ventricle: The right ventricular size is normal. No increase in  right ventricular wall thickness. Right ventricular systolic function is  normal. There is mildly elevated pulmonary artery systolic pressure. The  tricuspid regurgitant velocity is 2.82   m/s, and with an assumed right atrial pressure of 8 mmHg, the estimated  right ventricular systolic pressure is [id number] mmHg.   Left Atrium: Left atrial size was mild to moderately dilated.   Right Atrium: Right atrial size was mildly dilated.   Pericardium: Trivial pericardial effusion is present. Presence of  pericardial fat pad.   Mitral Valve: The mitral valve is normal in structure. Mild to moderate  mitral annular calcification. Trivial mitral valve regurgitation.   Tricuspid Valve: The tricuspid valve  is normal in structure. Tricuspid  valve regurgitation is trivial.   Aortic Valve: The aortic valve is calcified. There is severe calcifcation  of the aortic valve. There is severe thickening of the aortic valve.  Aortic valve regurgitation is trivial. Severe aortic stenosis is present.  Aortic valve mean gradient measures  54.0 mmHg. Aortic valve peak gradient measures 90.6 mmHg. Aortic valve  area, by VTI measures 0.56 cm.   Pulmonic Valve: The pulmonic valve was grossly normal. Pulmonic valve  regurgitation is trivial. No evidence of pulmonic stenosis.   Aorta: The aortic root, ascending aorta, aortic arch and descending aorta  are all structurally normal, with no evidence of dilitation or  obstruction.   Venous: The inferior vena cava is normal in size with less than 50%  respiratory variability, suggesting right atrial pressure of 8 mmHg.   IAS/Shunts: The atrial septum is grossly normal.      LEFT VENTRICLE  PLAX 2D  LVIDd:         3.60 cm   Diastology  LVIDs:         2.70 cm  LV e' medial:    5.87 cm/s  LV PW:         1.30 cm  LV E/e' medial:  17.0  LV IVS:        1.20 cm  LV e' lateral:   6.96 cm/s  LVOT diam:     1.80 cm  LV E/e' lateral: 14.4  LV SV:         73  LV SV Index:   45       2D Longitudinal Strain  LVOT Area:     2.54 cm 2D Strain GLS (A2C):   -18.3 %                          2D Strain GLS (A3C):   -14.9 %                          2D Strain GLS (A4C):   -18.2 %                          2D Strain GLS Avg:     -17.2 %   RIGHT VENTRICLE  RV Basal diam:  3.30 cm  RV S prime:     12.20 cm/s  TAPSE (M-mode): 1.9 cm  RVSP:           34.8 mmHg   LEFT ATRIUM             Index       RIGHT ATRIUM           Index  LA diam:        3.80 cm 2.35 cm/m  RA Pressure: 3.00 mmHg  LA Vol (A2C):   40.7 ml 25.17 ml/m RA Area:     14.10 cm  LA Vol (A4C):   48.0 ml 29.68 ml/m RA Volume:   35.50 ml  21.95 ml/m  LA Biplane Vol: 40.4 ml 24.98 ml/m   AORTIC VALVE  AV Area (Vmax):    0.55 cm  AV Area (Vmean):   0.56 cm  AV Area (VTI):     0.56 cm  AV Vmax:           476.00 cm/s  AV Vmean:          346.000 cm/s  AV  VTI:            1.300 m  AV Peak Grad:      90.6 mmHg  AV Mean Grad:      54.0 mmHg  LVOT Vmax:         102.00 cm/s  LVOT Vmean:        75.700 cm/s  LVOT VTI:          0.285 m  LVOT/AV VTI ratio: 0.22     AORTA  Ao Root diam: 2.80 cm  Ao Asc diam:  3.40 cm   MITRAL VALVE                TRICUSPID VALVE  MV Area (PHT):              TR Peak grad:   31.8 mmHg  MV Decel Time:              TR Vmax:        282.00 cm/s  MV E velocity: 100.00 cm/s  Estimated RAP:  3.00 mmHg  MV A velocity: 147.00 cm/s  RVSP:           34.8 mmHg  MV E/A ratio:  0.68                              SHUNTS                              Systemic VTI:  0.29 m                              Systemic Diam: 1.80 cm   Recent Labs: 01/10/2021: ALT 12; BUN 14; Creatinine, Ser 1.09; Hemoglobin 9.8; Platelets 401; Potassium 4.3; Sodium 141;  TSH 2.210   Wt Readings from Last 3 Encounters:  05/10/21 140 lb 12.8 oz (63.9 kg)  01/10/21 144 lb (65.3 kg)  07/24/20 146 lb 12.8 oz (66.6 kg)     Other studies Reviewed: Additional studies/ records that were reviewed today include: echo images, office notes Review of the above records demonstrates: severe AS   STS Risk Score: Risk of Mortality: 3.375% Renal Failure: 2.864% Permanent Stroke: 1.627% Prolonged Ventilation: 8.436% DSW Infection: 0.091% Reoperation: 3.914% Morbidity or Mortality: 13.185% Short Length of Stay: 26.942% Long Length of Stay: 7.033%   Assessment and Plan:   1. Severe Aortic Valve Stenosis: She has severe, stage D aortic valve stenosis. I have personally reviewed the echo images. The aortic valve is thickened, calcified with limited leaflet mobility. I think she would benefit from AVR. Given advanced age, she is not a good candidate for conventional AVR by surgical approach. I think she may be a good candidate for TAVR.   I have reviewed the natural history of aortic stenosis with the patient and their family members  who are present today. We have discussed the limitations of medical therapy and the poor prognosis associated with symptomatic aortic stenosis. We have reviewed potential treatment options, including palliative medical therapy, conventional surgical aortic valve replacement, and transcatheter aortic valve replacement. We discussed treatment options in the context of the patient's specific comorbid medical conditions.    She would like to proceed with planning for TAVR. I will arrange a right and left heart catheterization at [facility] 05/17/21 at noon. Risks and benefits of the cath procedure and  the valve procedure are reviewed with the patient. After the cath, she will have a cardiac CT, CTA of the chest/abdomen and pelvis, carotid artery dopplers, PT assessment and will then be referred to see Dr. Cyndia Bent.    BMET and CBC today.       Current medicines are reviewed at length with the patient today.  The patient does not have concerns regarding medicines.  The following changes have been made:  no change  Labs/ tests ordered today include:   Orders Placed This Encounter  Procedures   Basic metabolic panel   CBC   EKG 12-Lead    Disposition:   F/U with the valve team.    Signed, Lauree Chandler, MD 05/10/2021 1:26 PM    [facility] [place], [place], [place]  [address] Phone: [phone]; Fax: [phone]

## 2021-05-10 NOTE — Progress Notes (Signed)
Structural Heart Clinic Consult Note  Chief Complaint  Patient presents with   New Patient (Initial Visit)    Severe aortic stenosis   History of Present Illness: 80 yo [place] female with history of HTN, stage 3 chronic kidney disease, PAD and severe aortic stenosis who is here today as a new consult, referred by DrAcie Fredrickson, for further evaluation of her aortic stenosis and possible TAVR. She has been followed by Dr. Acie Fredrickson for moderate aortic stenosis since 2021. Echo in 2021 with aortic valve mean gradient of 36 mmHg. She was seen by Dr. Acie Fredrickson in May 2022 and reported a syncopal episode as well as worsened dyspnea on exertion. Echo 01/03/21 with LVEF=60-65%. The aortic valve is calcified and thickened. There is severe aortic stenosis. Mean gradient 54 mmHg, peak graadient 90.6 mmHg, AVA 0.56 cm2, dimensionless index 0.22.   She tells me today through an interpretor that she has progressive dyspnea with exertion. No chest pain. No LE edema. She has partial dentures and no active dental issues. She lives in [place].   Primary Care Physician: Prince Solian, MD Primary Cardiologist: Nahser Referring Cardiologist: Nahser  Past Medical History:  Diagnosis Date   CKD (chronic kidney disease), stage III ([place])    Hypertension    Murmur    Onychomycosis    Osteoporosis    Peripheral arterial disease ([place])    OF LEFT EXTREMITY    Primary osteoarthritis of right knee 05/26/2017   Seasonal allergies    Severe aortic stenosis    Vitamin D deficiency     Past Surgical History:  Procedure Laterality Date   KNEE SURGERY Left     Current Outpatient Medications  Medication Sig Dispense Refill   acetaminophen (TYLENOL) 650 MG CR tablet Take 650 mg by mouth in the morning and at bedtime.     amLODipine (NORVASC) 10 MG tablet Take 10 mg by mouth daily.   0   aspirin EC 81 MG tablet Take 81 mg by mouth daily.     Calcium Carb-Cholecalciferol (CALCIUM 600+D3 PO) Take by mouth.      losartan-hydrochlorothiazide (HYZAAR) 100-12.5 MG tablet Take 1 tablet by mouth daily.   0   pravastatin (PRAVACHOL) 40 MG tablet Take 40 mg by mouth daily.     No current facility-administered medications for this visit.    Allergies  Allergen Reactions   Tylenol [Acetaminophen]     Social History   Socioeconomic History   Marital status: Unknown    Spouse name: Not on file   Number of children: Not on file   Years of education: Not on file   Highest education level: Not on file  Occupational History   Not on file  Tobacco Use   Smoking status: Never   Smokeless tobacco: Never  Vaping Use   Vaping Use: Never used  Substance and Sexual Activity   Alcohol use: Never   Drug use: Never   Sexual activity: Not on file  Other Topics Concern   Not on file  Social History Narrative   Not on file   Social Determinants of Health   Financial Resource Strain: Not on file  Food Insecurity: Not on file  Transportation Needs: Not on file  Physical Activity: Not on file  Stress: Not on file  Social Connections: Not on file  Intimate Partner Violence: Not on file    Family History  Problem Relation Age of Onset   Hypertension Mother     Review of Systems:  As  stated in the HPI and otherwise negative.   BP (!) 150/60   Pulse 79   Ht '4\' 11"'$  (1.499 m)   Wt 140 lb 12.8 oz (63.9 kg)   SpO2 95%   BMI 28.44 kg/m   Physical Examination: General: Well developed, well nourished, NAD  HEENT: OP clear, mucus membranes moist  SKIN: warm, dry. No rashes. Neuro: No focal deficits  Musculoskeletal: Muscle strength 5/5 all ext  Psychiatric: Mood and affect normal  Neck: No JVD, no carotid bruits, no thyromegaly, no lymphadenopathy.  Lungs:Clear bilaterally, no wheezes, rhonci, crackles Cardiovascular: Regular rate and rhythm. Loud, harsh, late peaking systolic murmur.  Abdomen:Soft. Bowel sounds present. Non-tender.  Extremities: No lower extremity edema. Pulses are 2 + in the  bilateral DP/PT.  EKG:  EKG is ordered today. The ekg ordered today demonstrates Sinus  Echo 01/03/21:  1. Left ventricular ejection fraction, by estimation, is 60 to 65%. The  left ventricle has normal function. The left ventricle has no regional  wall motion abnormalities. There is mild concentric left ventricular  hypertrophy. Left ventricular diastolic  parameters are consistent with Grade I diastolic dysfunction (impaired  relaxation). The average left ventricular global longitudinal strain is  -17.2 %. The global longitudinal strain is normal.   2. Right ventricular systolic function is normal. The right ventricular  size is normal. There is mildly elevated pulmonary artery systolic  pressure.   3. Left atrial size was mild to moderately dilated.   4. Right atrial size was mildly dilated.   5. The mitral valve is normal in structure. Trivial mitral valve  regurgitation.   6. The aortic valve is calcified. There is severe calcifcation of the  aortic valve. There is severe thickening of the aortic valve. Aortic valve  regurgitation is trivial. Severe aortic valve stenosis. Aortic valve area,  by VTI measures 0.56 cm. Aortic  valve mean gradient measures 54.0 mmHg. Aortic valve Vmax measures 4.76  m/s.   7. The inferior vena cava is normal in size with <50% respiratory  variability, suggesting right atrial pressure of 8 mmHg.   Comparison(s): Changes from prior study are noted. Prior study noted  severe aortic stenosis, but gradients have increased since then, supports  worsening aortic stenosis.   FINDINGS   Left Ventricle: Left ventricular ejection fraction, by estimation, is 60  to 65%. The left ventricle has normal function. The left ventricle has no  regional wall motion abnormalities. The average left ventricular global  longitudinal strain is -17.2 %.  The global longitudinal strain is normal. The left ventricular internal  cavity size was normal in size. There is  mild concentric left ventricular  hypertrophy. Left ventricular diastolic parameters are consistent with  Grade I diastolic dysfunction  (impaired relaxation).   Right Ventricle: The right ventricular size is normal. No increase in  right ventricular wall thickness. Right ventricular systolic function is  normal. There is mildly elevated pulmonary artery systolic pressure. The  tricuspid regurgitant velocity is 2.82   m/s, and with an assumed right atrial pressure of 8 mmHg, the estimated  right ventricular systolic pressure is [id number] mmHg.   Left Atrium: Left atrial size was mild to moderately dilated.   Right Atrium: Right atrial size was mildly dilated.   Pericardium: Trivial pericardial effusion is present. Presence of  pericardial fat pad.   Mitral Valve: The mitral valve is normal in structure. Mild to moderate  mitral annular calcification. Trivial mitral valve regurgitation.   Tricuspid Valve: The tricuspid valve  is normal in structure. Tricuspid  valve regurgitation is trivial.   Aortic Valve: The aortic valve is calcified. There is severe calcifcation  of the aortic valve. There is severe thickening of the aortic valve.  Aortic valve regurgitation is trivial. Severe aortic stenosis is present.  Aortic valve mean gradient measures  54.0 mmHg. Aortic valve peak gradient measures 90.6 mmHg. Aortic valve  area, by VTI measures 0.56 cm.   Pulmonic Valve: The pulmonic valve was grossly normal. Pulmonic valve  regurgitation is trivial. No evidence of pulmonic stenosis.   Aorta: The aortic root, ascending aorta, aortic arch and descending aorta  are all structurally normal, with no evidence of dilitation or  obstruction.   Venous: The inferior vena cava is normal in size with less than 50%  respiratory variability, suggesting right atrial pressure of 8 mmHg.   IAS/Shunts: The atrial septum is grossly normal.      LEFT VENTRICLE  PLAX 2D  LVIDd:         3.60 cm   Diastology  LVIDs:         2.70 cm  LV e' medial:    5.87 cm/s  LV PW:         1.30 cm  LV E/e' medial:  17.0  LV IVS:        1.20 cm  LV e' lateral:   6.96 cm/s  LVOT diam:     1.80 cm  LV E/e' lateral: 14.4  LV SV:         73  LV SV Index:   45       2D Longitudinal Strain  LVOT Area:     2.54 cm 2D Strain GLS (A2C):   -18.3 %                          2D Strain GLS (A3C):   -14.9 %                          2D Strain GLS (A4C):   -18.2 %                          2D Strain GLS Avg:     -17.2 %   RIGHT VENTRICLE  RV Basal diam:  3.30 cm  RV S prime:     12.20 cm/s  TAPSE (M-mode): 1.9 cm  RVSP:           34.8 mmHg   LEFT ATRIUM             Index       RIGHT ATRIUM           Index  LA diam:        3.80 cm 2.35 cm/m  RA Pressure: 3.00 mmHg  LA Vol (A2C):   40.7 ml 25.17 ml/m RA Area:     14.10 cm  LA Vol (A4C):   48.0 ml 29.68 ml/m RA Volume:   35.50 ml  21.95 ml/m  LA Biplane Vol: 40.4 ml 24.98 ml/m   AORTIC VALVE  AV Area (Vmax):    0.55 cm  AV Area (Vmean):   0.56 cm  AV Area (VTI):     0.56 cm  AV Vmax:           476.00 cm/s  AV Vmean:          346.000 cm/s  AV  VTI:            1.300 m  AV Peak Grad:      90.6 mmHg  AV Mean Grad:      54.0 mmHg  LVOT Vmax:         102.00 cm/s  LVOT Vmean:        75.700 cm/s  LVOT VTI:          0.285 m  LVOT/AV VTI ratio: 0.22     AORTA  Ao Root diam: 2.80 cm  Ao Asc diam:  3.40 cm   MITRAL VALVE                TRICUSPID VALVE  MV Area (PHT):              TR Peak grad:   31.8 mmHg  MV Decel Time:              TR Vmax:        282.00 cm/s  MV E velocity: 100.00 cm/s  Estimated RAP:  3.00 mmHg  MV A velocity: 147.00 cm/s  RVSP:           34.8 mmHg  MV E/A ratio:  0.68                              SHUNTS                              Systemic VTI:  0.29 m                              Systemic Diam: 1.80 cm   Recent Labs: 01/10/2021: ALT 12; BUN 14; Creatinine, Ser 1.09; Hemoglobin 9.8; Platelets 401; Potassium 4.3; Sodium 141;  TSH 2.210   Wt Readings from Last 3 Encounters:  05/10/21 140 lb 12.8 oz (63.9 kg)  01/10/21 144 lb (65.3 kg)  07/24/20 146 lb 12.8 oz (66.6 kg)     Other studies Reviewed: Additional studies/ records that were reviewed today include: echo images, office notes Review of the above records demonstrates: severe AS   STS Risk Score: Risk of Mortality: 3.375% Renal Failure: 2.864% Permanent Stroke: 1.627% Prolonged Ventilation: 8.436% DSW Infection: 0.091% Reoperation: 3.914% Morbidity or Mortality: 13.185% Short Length of Stay: 26.942% Long Length of Stay: 7.033%   Assessment and Plan:   1. Severe Aortic Valve Stenosis: She has severe, stage D aortic valve stenosis. I have personally reviewed the echo images. The aortic valve is thickened, calcified with limited leaflet mobility. I think she would benefit from AVR. Given advanced age, she is not a good candidate for conventional AVR by surgical approach. I think she may be a good candidate for TAVR.   I have reviewed the natural history of aortic stenosis with the patient and their family members  who are present today. We have discussed the limitations of medical therapy and the poor prognosis associated with symptomatic aortic stenosis. We have reviewed potential treatment options, including palliative medical therapy, conventional surgical aortic valve replacement, and transcatheter aortic valve replacement. We discussed treatment options in the context of the patient's specific comorbid medical conditions.    She would like to proceed with planning for TAVR. I will arrange a right and left heart catheterization at [facility] 05/17/21 at noon. Risks and benefits of the cath procedure and  the valve procedure are reviewed with the patient. After the cath, she will have a cardiac CT, CTA of the chest/abdomen and pelvis, carotid artery dopplers, PT assessment and will then be referred to see Dr. Cyndia Bent.    BMET and CBC today.       Current medicines are reviewed at length with the patient today.  The patient does not have concerns regarding medicines.  The following changes have been made:  no change  Labs/ tests ordered today include:   Orders Placed This Encounter  Procedures   Basic metabolic panel   CBC   EKG 12-Lead    Disposition:   F/U with the valve team.    Signed, Lauree Chandler, MD 05/10/2021 1:26 PM    [facility] [address], [address], [place]  [address] Phone: [phone]; Fax: [phone]

## 2021-05-11 LAB — CBC
Hematocrit: 34.6 % (ref 34.0–46.6)
Hemoglobin: 10.5 g/dL — ABNORMAL LOW (ref 11.1–15.9)
MCH: 25.2 pg — ABNORMAL LOW (ref 26.6–33.0)
MCHC: 30.3 g/dL — ABNORMAL LOW (ref 31.5–35.7)
MCV: 83 fL (ref 79–97)
Platelets: 416 10*3/uL (ref 150–450)
RBC: 4.17 x10E6/uL (ref 3.77–5.28)
RDW: 14.2 % (ref 11.7–15.4)
WBC: 13.9 10*3/uL — ABNORMAL HIGH (ref 3.4–10.8)

## 2021-05-11 LAB — BASIC METABOLIC PANEL
BUN/Creatinine Ratio: 14 (ref 12–28)
BUN: 15 mg/dL (ref 8–27)
CO2: 20 mmol/L (ref 20–29)
Calcium: 9.1 mg/dL (ref 8.7–10.3)
Chloride: 101 mmol/L (ref 96–106)
Creatinine, Ser: 1.08 mg/dL — ABNORMAL HIGH (ref 0.57–1.00)
Glucose: 114 mg/dL — ABNORMAL HIGH (ref 65–99)
Potassium: 4.3 mmol/L (ref 3.5–5.2)
Sodium: 140 mmol/L (ref 134–144)
eGFR: 52 mL/min/{1.73_m2} — ABNORMAL LOW (ref >59)

## 2021-05-15 ENCOUNTER — Telehealth: Payer: Self-pay | Admitting: *Deleted

## 2021-05-15 NOTE — Telephone Encounter (Addendum)
Cardiac catheterization scheduled at [facility] for: Friday May 17, 2021 [facility] Main Entrance A [facility]) at: 10 AM   No solid food after midnight prior to cath, clear liquids until 5 AM day of procedure.  Medication instructions: Hold: Losartan/HCTZ-AM of procedure   Except hold medications morning medications can be taken pre-cath with sips of water including aspirin 81 mg.    Confirmed patient has responsible adult to drive home post procedure and be with patient first 24 hours after arriving home.  Patients are allowed one visitor in the waiting room during the time they are at the hospital for their procedure. Both patient and visitor must wear a mask once they enter the hospital.         Call placed  to  Leda Gauze [phone]), [facility], left voicemail message to call me back to confirm transportation.         Call placed to Jeris Penta, MA (DPR) to discuss reviewing instructions with patient, I did not get answer at phone number listed for him on DPR.     Call placed to home number listed for patient, voicemail message unable to leave message.     Per 05/10/21 staff message: per interpreter with patient at 05/10/21 office visit- interpretor phone does not work with her language/dialect.

## 2021-05-15 NOTE — Telephone Encounter (Addendum)
Spoke with Leda Gauze at [place], confirmed transportation for Right/Left Heart Cath 05/17/21 at [facility] arrive 10 AM for 12 Noon procedure. Leda Gauze reports patient's son, Danise Edge [phone], plans to meet patient at hospital 05/17/21.   I spoke with  Jeris Penta, ( DPR), and have made arrangements to call 05/16/21 at 12:30 PM to review instructions with patient with his assistance.

## 2021-05-16 NOTE — Telephone Encounter (Signed)
Reviewed procedure/mask/visitor instructions with patient with assistance of Diona Fanti [phone]).

## 2021-05-17 ENCOUNTER — Other Ambulatory Visit: Payer: Self-pay

## 2021-05-17 ENCOUNTER — Ambulatory Visit (HOSPITAL_COMMUNITY)
Admission: RE | Admit: 2021-05-17 | Discharge: 2021-05-17 | Disposition: A | Discharge status: Home / Self Care | Payer: Medicare (Managed Care) | Attending: Cardiovascular Disease | Admitting: Cardiovascular Disease

## 2021-05-17 ENCOUNTER — Encounter (HOSPITAL_COMMUNITY)
Admission: RE | Disposition: A | Discharge status: Home / Self Care | Payer: Self-pay | Source: Home / Self Care | Attending: Cardiovascular Disease

## 2021-05-17 DIAGNOSIS — N183 Chronic kidney disease, stage 3 unspecified: Secondary | ICD-10-CM | POA: Diagnosis not present

## 2021-05-17 DIAGNOSIS — Z7982 Long term (current) use of aspirin: Secondary | ICD-10-CM | POA: Diagnosis not present

## 2021-05-17 DIAGNOSIS — Z79899 Other long term (current) drug therapy: Secondary | ICD-10-CM | POA: Diagnosis not present

## 2021-05-17 DIAGNOSIS — Z886 Allergy status to analgesic agent status: Secondary | ICD-10-CM | POA: Insufficient documentation

## 2021-05-17 DIAGNOSIS — I739 Peripheral vascular disease, unspecified: Secondary | ICD-10-CM | POA: Diagnosis not present

## 2021-05-17 DIAGNOSIS — I350 Nonrheumatic aortic (valve) stenosis: Secondary | ICD-10-CM

## 2021-05-17 DIAGNOSIS — I129 Hypertensive chronic kidney disease with stage 1 through stage 4 chronic kidney disease, or unspecified chronic kidney disease: Secondary | ICD-10-CM | POA: Diagnosis not present

## 2021-05-17 DIAGNOSIS — I2510 Atherosclerotic heart disease of native coronary artery without angina pectoris: Secondary | ICD-10-CM | POA: Diagnosis not present

## 2021-05-17 HISTORY — PX: RIGHT/LEFT HEART CATH AND CORONARY ANGIOGRAPHY: CATH118266

## 2021-05-17 LAB — POCT I-STAT 7, (LYTES, BLD GAS, ICA,H+H)
Acid-Base Excess: 1 mmol/L (ref 0.0–2.0)
Acid-base deficit: 1 mmol/L (ref 0.0–2.0)
Bicarbonate: 24.4 mmol/L (ref 20.0–28.0)
Bicarbonate: 25.8 mmol/L (ref 20.0–28.0)
Calcium, Ion: 1.14 mmol/L — ABNORMAL LOW (ref 1.15–1.40)
Calcium, Ion: 1.25 mmol/L (ref 1.15–1.40)
HCT: 27 % — ABNORMAL LOW (ref 36.0–46.0)
HCT: 28 % — ABNORMAL LOW (ref 36.0–46.0)
Hemoglobin: 9.2 g/dL — ABNORMAL LOW (ref 12.0–15.0)
Hemoglobin: 9.5 g/dL — ABNORMAL LOW (ref 12.0–15.0)
O2 Saturation: 70 %
O2 Saturation: 95 %
Potassium: 3.5 mmol/L (ref 3.5–5.1)
Potassium: 3.8 mmol/L (ref 3.5–5.1)
Sodium: 142 mmol/L (ref 135–145)
Sodium: 145 mmol/L (ref 135–145)
TCO2: 26 mmol/L (ref 22–32)
TCO2: 27 mmol/L (ref 22–32)
pCO2 arterial: 41.9 mmHg (ref 32.0–48.0)
pCO2 arterial: 42.3 mmHg (ref 32.0–48.0)
pH, Arterial: 7.368 (ref 7.350–7.450)
pH, Arterial: 7.398 (ref 7.350–7.450)
pO2, Arterial: 38 mmHg — CL (ref 83.0–108.0)
pO2, Arterial: 74 mmHg — ABNORMAL LOW (ref 83.0–108.0)

## 2021-05-17 SURGERY — RIGHT/LEFT HEART CATH AND CORONARY ANGIOGRAPHY
Anesthesia: LOCAL

## 2021-05-17 MED ORDER — SODIUM CHLORIDE 0.9% FLUSH: 3.0000 mL | INTRAVENOUS | Status: DC | PRN

## 2021-05-17 MED ORDER — SODIUM CHLORIDE 0.9% FLUSH: 3.0000 mL | Freq: Two times a day (BID) | INTRAVENOUS | Status: DC

## 2021-05-17 MED ORDER — HYDRALAZINE HCL 20 MG/ML IJ SOLN: 10.0000 mg | INTRAMUSCULAR | Status: DC | PRN

## 2021-05-17 MED ORDER — MIDAZOLAM HCL 2 MG/2ML IJ SOLN
INTRAMUSCULAR | Status: AC
  Filled 2021-05-17: qty 2

## 2021-05-17 MED ORDER — VERAPAMIL HCL 2.5 MG/ML IV SOLN
INTRAVENOUS | Status: AC
  Filled 2021-05-17: qty 2

## 2021-05-17 MED ORDER — SODIUM CHLORIDE 0.9 % IV SOLN: 250.0000 mL | INTRAVENOUS | Status: DC | PRN

## 2021-05-17 MED ORDER — HEPARIN (PORCINE) IN NACL 1000-0.9 UT/500ML-% IV SOLN
INTRAVENOUS | Status: AC
  Filled 2021-05-17: qty 500

## 2021-05-17 MED ORDER — SODIUM CHLORIDE 0.9 % IV SOLN: INTRAVENOUS | Status: DC

## 2021-05-17 MED ORDER — LIDOCAINE HCL (PF) 1 % IJ SOLN
INTRAMUSCULAR | Status: AC
  Filled 2021-05-17: qty 30

## 2021-05-17 MED ORDER — VERAPAMIL HCL 2.5 MG/ML IV SOLN
INTRAVENOUS | Status: DC | PRN
  Administered 2021-05-17: 10 mL via INTRA_ARTERIAL

## 2021-05-17 MED ORDER — HEPARIN (PORCINE) IN NACL 1000-0.9 UT/500ML-% IV SOLN
INTRAVENOUS | Status: DC | PRN
  Administered 2021-05-17 (×2): 500 mL

## 2021-05-17 MED ORDER — HEPARIN SODIUM (PORCINE) 1000 UNIT/ML IJ SOLN
INTRAMUSCULAR | Status: DC | PRN
  Administered 2021-05-17: 3000 [IU] via INTRAVENOUS

## 2021-05-17 MED ORDER — SODIUM CHLORIDE 0.9 % WEIGHT BASED INFUSION: 1.0000 mL/kg/h | INTRAVENOUS | Status: DC

## 2021-05-17 MED ORDER — HEPARIN SODIUM (PORCINE) 1000 UNIT/ML IJ SOLN
INTRAMUSCULAR | Status: AC
  Filled 2021-05-17: qty 1

## 2021-05-17 MED ORDER — LABETALOL HCL 5 MG/ML IV SOLN: 10.0000 mg | INTRAVENOUS | Status: DC | PRN

## 2021-05-17 MED ORDER — IOHEXOL 350 MG/ML SOLN
INTRAVENOUS | Status: DC | PRN
  Administered 2021-05-17: 40 mL

## 2021-05-17 MED ORDER — SODIUM CHLORIDE 0.9 % WEIGHT BASED INFUSION
3.0000 mL/kg/h | INTRAVENOUS | Status: AC
  Administered 2021-05-17: 3 mL/kg/h via INTRAVENOUS

## 2021-05-17 MED ORDER — ONDANSETRON HCL 4 MG/2ML IJ SOLN: 4.0000 mg | Freq: Four times a day (QID) | INTRAMUSCULAR | Status: DC | PRN

## 2021-05-17 MED ORDER — LIDOCAINE HCL (PF) 1 % IJ SOLN
INTRAMUSCULAR | Status: DC | PRN
  Administered 2021-05-17 (×2): 2 mL

## 2021-05-17 MED ORDER — ASPIRIN 81 MG PO CHEW: 81.0000 mg | CHEWABLE_TABLET | ORAL | Status: DC

## 2021-05-17 MED ORDER — FENTANYL CITRATE (PF) 100 MCG/2ML IJ SOLN
INTRAMUSCULAR | Status: DC | PRN
  Administered 2021-05-17: 25 ug via INTRAVENOUS

## 2021-05-17 MED ORDER — FENTANYL CITRATE (PF) 100 MCG/2ML IJ SOLN
INTRAMUSCULAR | Status: AC
  Filled 2021-05-17: qty 2

## 2021-05-17 MED ORDER — MIDAZOLAM HCL 2 MG/2ML IJ SOLN
INTRAMUSCULAR | Status: DC | PRN
  Administered 2021-05-17: 1 mg via INTRAVENOUS

## 2021-05-17 SURGICAL SUPPLY — 12 items
CATH 5FR JL3.5 JR4 ANG PIG MP (CATHETERS) ×1 IMPLANT
CATH BALLN WEDGE 5F 110CM (CATHETERS) ×1 IMPLANT
DEVICE RAD COMP TR BAND LRG (VASCULAR PRODUCTS) IMPLANT
DEVICE RAD TR BAND REGULAR (VASCULAR PRODUCTS) ×1 IMPLANT
GLIDESHEATH SLEND SS 6F .021 (SHEATH) ×1 IMPLANT
GUIDEWIRE INQWIRE 1.5J.035X260 (WIRE) IMPLANT
INQWIRE 1.5J .035X260CM (WIRE) ×2
KIT HEART LEFT (KITS) ×2 IMPLANT
PACK CARDIAC CATHETERIZATION (CUSTOM PROCEDURE TRAY) ×2 IMPLANT
SHEATH GLIDE SLENDER 4/5FR (SHEATH) ×1 IMPLANT
TRANSDUCER W/STOPCOCK (MISCELLANEOUS) ×2 IMPLANT
TUBING CIL FLEX 10 FLL-RA (TUBING) ×2 IMPLANT

## 2021-05-17 NOTE — Progress Notes (Signed)
Pt ambulated without difficulty or bleeding.   Discharged home with her son who will drive and stay with pt x 24 hrs.

## 2021-05-17 NOTE — Discharge Instructions (Signed)
Radial Site Care  This sheet gives you information about how to care for yourself after your procedure. Your health care provider may also give you more specific instructions. If you have problems or questions, contact your health care provider. What can I expect after the procedure? After the procedure, it is common to have: Bruising and tenderness at the catheter insertion area. Follow these instructions at home: Medicines Take over-the-counter and prescription medicines only as told by your health care provider. Insertion site care Follow instructions from your health care provider about how to take care of your insertion site. Make sure you: Wash your hands with soap and water before you remove your bandage (dressing). If soap and water are not available, use hand sanitizer. May remove dressing in 24 hours. Check your insertion site every day for signs of infection. Check for: Redness, swelling, or pain. Fluid or blood. Pus or a bad smell. Warmth. Do no take baths, swim, or use a hot tub for 5 days. You may shower 24-48 hours after the procedure. Remove the dressing and gently wash the site with plain soap and water. Pat the area dry with a clean towel. Do not rub the site. That could cause bleeding. Do not apply powder or lotion to the site. Activity  For 24 hours after the procedure, or as directed by your health care provider: Do not flex or bend the affected arm. Do not push or pull heavy objects with the affected arm. Do not drive yourself home from the hospital or clinic. You may drive 24 hours after the procedure. Do not operate machinery or power tools. KEEP ARM ELEVATED THE REMAINDER OF THE DAY. Do not push, pull or lift anything that is heavier than 10 lb for 5 days. Ask your health care provider when it is okay to: Return to work or school. Resume usual physical activities or sports. Resume sexual activity. General instructions If the catheter site starts to  bleed, raise your arm and put firm pressure on the site. If the bleeding does not stop, get help right away. This is a medical emergency. DRINK PLENTY OF FLUIDS FOR THE NEXT 2-3 DAYS. No alcohol consumption for 24 hours after receiving sedation. If you went home on the same day as your procedure, a responsible adult should be with you for the first 24 hours after you arrive home. Keep all follow-up visits as told by your health care provider. This is important. Contact a health care provider if: You have a fever. You have redness, swelling, or yellow drainage around your insertion site. Get help right away if: You have unusual pain at the radial site. The catheter insertion area swells very fast. The insertion area is bleeding, and the bleeding does not stop when you hold steady pressure on the area. Your arm or hand becomes pale, cool, tingly, or numb. These symptoms may represent a serious problem that is an emergency. Do not wait to see if the symptoms will go away. Get medical help right away. Call your local emergency services (911 in the [place] Do not drive yourself to the hospital. Summary After the procedure, it is common to have bruising and tenderness at the site. Follow instructions from your health care provider about how to take care of your radial site wound. Check the wound every day for signs of infection.  This information is not intended to replace advice given to you by your health care provider. Make sure you discuss any questions you have with   your health care provider. Document Revised: 09/30/2017 Document Reviewed: 09/30/2017 Elsevier Patient Education  2020 Elsevier Inc.  

## 2021-05-17 NOTE — Interval H&P Note (Signed)
History and Physical Interval Note:  05/17/2021 11:49 AM  Krystal Maddox  has presented today for surgery, with the diagnosis of aortic stenosis.  The various methods of treatment have been discussed with the patient and family. After consideration of risks, benefits and other options for treatment, the patient has consented to  Procedure(s): RIGHT/LEFT HEART CATH AND CORONARY ANGIOGRAPHY (N/A) as a surgical intervention.  The patient's history has been reviewed, patient examined, no change in status, stable for surgery.  I have reviewed the patient's chart and labs.  Questions were answered to the patient's satisfaction.    Cath Lab Visit (complete for each Cath Lab visit)  Clinical Evaluation Leading to the Procedure:   ACS: No.  Non-ACS:    Anginal Classification: CCS II  Anti-ischemic medical therapy: Minimal Therapy (1 class of medications)  Non-Invasive Test Results: No non-invasive testing performed  Prior CABG: No previous CABG        Lauree Chandler

## 2021-05-20 ENCOUNTER — Emergency Department (HOSPITAL_COMMUNITY)
Admission: EM | Admit: 2021-05-20 | Discharge: 2021-05-21 | Disposition: A | Discharge status: Home / Self Care | Payer: Medicare (Managed Care) | Attending: Emergency Medicine | Admitting: Emergency Medicine

## 2021-05-20 ENCOUNTER — Other Ambulatory Visit: Payer: Self-pay

## 2021-05-20 ENCOUNTER — Emergency Department (HOSPITAL_COMMUNITY): Payer: Medicare (Managed Care)

## 2021-05-20 ENCOUNTER — Emergency Department (HOSPITAL_BASED_OUTPATIENT_CLINIC_OR_DEPARTMENT_OTHER): Payer: Medicare (Managed Care)

## 2021-05-20 ENCOUNTER — Encounter (HOSPITAL_COMMUNITY): Payer: Self-pay | Admitting: Cardiovascular Disease

## 2021-05-20 DIAGNOSIS — M549 Dorsalgia, unspecified: Secondary | ICD-10-CM | POA: Insufficient documentation

## 2021-05-20 DIAGNOSIS — R0789 Other chest pain: Secondary | ICD-10-CM | POA: Diagnosis present

## 2021-05-20 DIAGNOSIS — I82611 Acute embolism and thrombosis of superficial veins of right upper extremity: Secondary | ICD-10-CM | POA: Diagnosis not present

## 2021-05-20 DIAGNOSIS — N183 Chronic kidney disease, stage 3 unspecified: Secondary | ICD-10-CM | POA: Insufficient documentation

## 2021-05-20 DIAGNOSIS — Z955 Presence of coronary angioplasty implant and graft: Secondary | ICD-10-CM | POA: Diagnosis not present

## 2021-05-20 DIAGNOSIS — R609 Edema, unspecified: Secondary | ICD-10-CM | POA: Diagnosis not present

## 2021-05-20 DIAGNOSIS — Z7982 Long term (current) use of aspirin: Secondary | ICD-10-CM | POA: Insufficient documentation

## 2021-05-20 DIAGNOSIS — I129 Hypertensive chronic kidney disease with stage 1 through stage 4 chronic kidney disease, or unspecified chronic kidney disease: Secondary | ICD-10-CM | POA: Diagnosis not present

## 2021-05-20 DIAGNOSIS — Z79899 Other long term (current) drug therapy: Secondary | ICD-10-CM | POA: Diagnosis not present

## 2021-05-20 LAB — TROPONIN I (HIGH SENSITIVITY)
Troponin I (High Sensitivity): 11 ng/L (ref <18)
Troponin I (High Sensitivity): 14 ng/L (ref <18)

## 2021-05-20 LAB — CBC
HCT: 29.8 % — ABNORMAL LOW (ref 36.0–46.0)
Hemoglobin: 9.5 g/dL — ABNORMAL LOW (ref 12.0–15.0)
MCH: 26.3 pg (ref 26.0–34.0)
MCHC: 31.9 g/dL (ref 30.0–36.0)
MCV: 82.5 fL (ref 80.0–100.0)
Platelets: 349 10*3/uL (ref 150–400)
RBC: 3.61 MIL/uL — ABNORMAL LOW (ref 3.87–5.11)
RDW: 13.8 % (ref 11.5–15.5)
WBC: 11 10*3/uL — ABNORMAL HIGH (ref 4.0–10.5)
nRBC: 0 % (ref 0.0–0.2)

## 2021-05-20 LAB — BASIC METABOLIC PANEL
Anion gap: 8 (ref 5–15)
BUN: 12 mg/dL (ref 8–23)
CO2: 24 mmol/L (ref 22–32)
Calcium: 9.1 mg/dL (ref 8.9–10.3)
Chloride: 99 mmol/L (ref 98–111)
Creatinine, Ser: 1.06 mg/dL — ABNORMAL HIGH (ref 0.44–1.00)
GFR, Estimated: 53 mL/min — ABNORMAL LOW (ref >60)
Glucose, Bld: 221 mg/dL — ABNORMAL HIGH (ref 70–99)
Potassium: 3.2 mmol/L — ABNORMAL LOW (ref 3.5–5.1)
Sodium: 131 mmol/L — ABNORMAL LOW (ref 135–145)

## 2021-05-20 MED ORDER — ONDANSETRON HCL 4 MG/2ML IJ SOLN
4.0000 mg | Freq: Once | INTRAMUSCULAR | Status: AC
  Administered 2021-05-20: 4 mg via INTRAVENOUS
  Filled 2021-05-20: qty 2

## 2021-05-20 NOTE — ED Provider Notes (Signed)
[facility] EMERGENCY DEPARTMENT Provider Note   CSN: [id number] Arrival date & time: 05/20/21  1620     History Chief Complaint  Patient presents with   Chest Pain    Krystal Maddox is a 80 y.o. female.   Chest Pain Associated symptoms: back pain and cough   Associated symptoms: no vomiting and no weakness   Patient presents with chest pain and back pain.  Patient speaks [name] psychotherapist was used although [employer] stated that he was still having some difficulty translating her.  Had a right and left heart catheterization on Friday with today being Monday.  Done for severe aortic stenosis.  Has had more pain and swelling on right back and down to right arm.  Has had a cough with some sputum production.  States she was worried about the pain in her back. Past Medical History:  Diagnosis Date   CKD (chronic kidney disease), stage III (HCC)    Hypertension    Murmur    Onychomycosis    Osteoporosis    Peripheral arterial disease ([place])    OF LEFT EXTREMITY    Primary osteoarthritis of right knee 05/26/2017   Seasonal allergies    Severe aortic stenosis    Vitamin D deficiency     Patient Active Problem List   Diagnosis Date Noted   Severe aortic stenosis    Aortic stenosis 01/20/2020   Primary osteoarthritis of right knee 05/26/2017    Past Surgical History:  Procedure Laterality Date   KNEE SURGERY Left    RIGHT/LEFT HEART CATH AND CORONARY ANGIOGRAPHY N/A 05/17/2021   Procedure: RIGHT/LEFT HEART CATH AND CORONARY ANGIOGRAPHY;  Surgeon: Burnell Blanks, MD;  Location: [place] CV LAB;  Service: Cardiovascular;  Laterality: N/A;     OB History   No obstetric history on file.     Family History  Problem Relation Age of Onset   Hypertension Mother     Social History   Tobacco Use   Smoking status: Never   Smokeless tobacco: Never  Vaping Use   Vaping Use: Never used  Substance Use Topics   Alcohol  use: Never   Drug use: Never    Home Medications Prior to Admission medications   Medication Sig Start Date End Date Taking? Authorizing Provider  acetaminophen (TYLENOL) 650 MG CR tablet Take 650 mg by mouth in the morning and at bedtime.   Yes [provider]  amLODipine (NORVASC) 10 MG tablet Take 10 mg by mouth daily.  03/18/17  Yes [provider]  aspirin EC 81 MG tablet Take 81 mg by mouth daily.   Yes [provider]  Calcium Carb-Cholecalciferol (CALCIUM 600+D3 PO) Take 1 tablet by mouth in the morning and at bedtime.   Yes [provider]  ferrous sulfate 325 (65 FE) MG tablet Take 325 mg by mouth daily.   Yes [provider]  losartan-hydrochlorothiazide (HYZAAR) 100-12.5 MG tablet Take 1 tablet by mouth daily.  05/06/17  Yes [provider]  Menthol, Topical Analgesic, (BIOFREEZE) 4 % GEL Apply 1 application topically 4 (four) times daily as needed (joint/muscle pain).   Yes [provider]  Menthol-Methyl Salicylate (SALONPAS PAIN RELIEF PATCH EX) Place 1 patch onto the skin every 8 (eight) hours as needed (pain).   Yes [provider]  Multiple Vitamins-Minerals (PRESERVISION AREDS 2 PO) Take 1 tablet by mouth in the morning and at bedtime.   Yes [provider]  Polyethyl  Glyc-Propyl Glyc PF (SYSTANE HYDRATION PF) 0.4-0.3 % SOLN Place 1 drop into both eyes 4 (four) times daily as needed (dry eyes).   Yes [provider]  pravastatin (PRAVACHOL) 80 MG tablet Take 80 mg by mouth daily.   Yes [provider]  senna-docusate (SENOKOT-S) 8.6-50 MG tablet Take 2 tablets by mouth at bedtime.   Yes [provider]  Skin Protectants, Misc. (EUCERIN) cream Apply 1 application topically in the morning and at bedtime.   Yes [provider]  tolnaftate (TINACTIN) 1 % spray Apply 1 spray topically daily. Applied to feet   Yes [provider]    Allergies    Tylenol  [acetaminophen]  Review of Systems   Review of Systems  Constitutional:  Negative for appetite change.  HENT:  Negative for congestion.   Respiratory:  Positive for cough.   Cardiovascular:  Positive for chest pain.  Gastrointestinal:  Negative for vomiting.  Genitourinary:  Negative for flank pain.  Musculoskeletal:  Positive for back pain.  Neurological:  Negative for weakness.   Physical Exam Updated Vital Signs BP 132/62   Pulse 73   Temp 97.7 F (36.5 C)   Resp 19   SpO2 99%   Physical Exam Vitals and nursing note reviewed.  Cardiovascular:     Rate and Rhythm: Normal rate and regular rhythm.     Heart sounds: Murmur heard.  Systolic murmur is present.  Pulmonary:     Effort: No tachypnea.     Breath sounds: No wheezing, rhonchi or rales.  Chest:     Chest wall: No tenderness.  Abdominal:     Tenderness: There is no abdominal tenderness.  Musculoskeletal:     Cervical back: Neck supple.     Right lower leg: No edema.     Left lower leg: No edema.     Comments: Some edema of right forearm.  Mild ecchymosis at right forearm radial access site.  Right hand is warm  Skin:    Capillary Refill: Capillary refill takes less than 2 seconds.  Neurological:     Mental Status: She is alert.    ED Results / Procedures / Treatments   Labs (all labs ordered are listed, but only abnormal results are displayed) Labs Reviewed  BASIC METABOLIC PANEL - Abnormal; Notable for the following components:      Result Value   Sodium 131 (*)    Potassium 3.2 (*)    Glucose, Bld 221 (*)    Creatinine, Ser 1.06 (*)    GFR, Estimated 53 (*)    All other components within normal limits  CBC - Abnormal; Notable for the following components:   WBC 11.0 (*)    RBC 3.61 (*)    Hemoglobin 9.5 (*)    HCT 29.8 (*)    All other components within normal limits  TROPONIN I (HIGH SENSITIVITY)  TROPONIN I (HIGH SENSITIVITY)    EKG EKG Interpretation  Date/Time:  Monday May 20 2021 16:26:46 EDT Ventricular Rate:  74 PR Interval:  226 QRS Duration: 97 QT Interval:  438 QTC Calculation: 486 R Axis:   45 Text Interpretation: Sinus rhythm Prolonged PR interval Nonspecific T abnormalities, lateral leads Borderline prolonged QT interval No old tracing to compare Confirmed by Davonna Belling [id number]) on 05/20/2021 4:28:05 PM  Radiology DG Chest Portable 1 View  Result Date: 05/20/2021 CLINICAL DATA:  Midsternal chest pain and nausea for 2 hours EXAM: PORTABLE CHEST 1 VIEW COMPARISON:  09/21/2020 FINDINGS: Single  frontal view of the chest demonstrates a stable cardiac silhouette. No airspace disease, effusion, or pneumothorax. No acute bony abnormalities. IMPRESSION: 1. No acute intrathoracic process. Electronically Signed   By: Randa Ngo M.D.   On: 05/20/2021 18:08   UE Venous Duplex ([facility] and [facility] ONLY)  Result Date: 05/20/2021 UPPER VENOUS STUDY  Patient Name:  [facility]  Date of Exam:   05/20/2021 Medical Rec #: [id number]      Accession #:    [id number] Date of Birth: 1941/05/21       Patient Gender: F Patient Age:   69 years Exam Location:  [facility] Procedure:      VAS [place] UPPER EXTREMITY VENOUS DUPLEX Referring Phys: Davonna Belling --------------------------------------------------------------------------------  Indications: edema with recent heart cath Comparison Study: no prior Performing Technologist: Archie Patten RVS  Examination Guidelines: A complete evaluation includes B-mode imaging, spectral Doppler, color Doppler, and power Doppler as needed of all accessible portions of each vessel. Bilateral testing is considered an integral part of a complete examination. Limited examinations for reoccurring indications may be performed as noted.  Right Findings: +----------+------------+---------+-----------+----------+-----------------+ RIGHT     CompressiblePhasicitySpontaneousProperties     Summary       +----------+------------+---------+-----------+----------+-----------------+ IJV           Full       Yes       Yes                                +----------+------------+---------+-----------+----------+-----------------+ Subclavian    Full       Yes       Yes                                +----------+------------+---------+-----------+----------+-----------------+ Axillary      Full       Yes       Yes                                +----------+------------+---------+-----------+----------+-----------------+ Brachial      Full       Yes       Yes                                +----------+------------+---------+-----------+----------+-----------------+ Radial        Full                                                    +----------+------------+---------+-----------+----------+-----------------+ Ulnar         Full                                                    +----------+------------+---------+-----------+----------+-----------------+ Cephalic      None                                  Age Indeterminate +----------+------------+---------+-----------+----------+-----------------+ Basilic       Full                                                    +----------+------------+---------+-----------+----------+-----------------+  Left Findings: +----------+------------+---------+-----------+----------+-------+ LEFT      CompressiblePhasicitySpontaneousPropertiesSummary +----------+------------+---------+-----------+----------+-------+ Subclavian    Full       Yes       Yes                      +----------+------------+---------+-----------+----------+-------+  Summary:  Right: No evidence of deep vein thrombosis in the upper extremity. Findings consistent with age indeterminate superficial vein thrombosis involving the right cephalic vein.  *See table(s) above for measurements and observations.  Diagnosing physician: Servando Snare MD  Electronically signed by Servando Snare MD on 05/20/2021 at 10:59:36 PM.    Final     Procedures Procedures   Medications Ordered in ED Medications  ondansetron [facility]) injection 4 mg (4 mg Intravenous Given 05/20/21 2009)    ED Course  I have reviewed the triage vital signs and the nursing notes.  Pertinent labs & imaging results that were available during my care of the patient were reviewed by me and considered in my medical decision making (see chart for details).    MDM Rules/Calculators/A&P                           Patient with pain and swelling right arm post right and left heart catheterization.  Has superficial clot in the cephalic vein.  With the chest pain will get CTA.  If negative discharge home.  Call cardiology about follow-up.  Care turned over to Dr. Dina Rich. Doubt cardiac ischemia as a cause of the pain Final Clinical Impression(s) / ED Diagnoses Final diagnoses:  Superficial venous thrombosis of arm, right    Rx / DC Orders ED Discharge Orders     None        Davonna Belling, MD 05/20/21 2345

## 2021-05-20 NOTE — ED Notes (Signed)
Interpreter brought to the bedside. EDP notified.

## 2021-05-20 NOTE — ED Notes (Signed)
[phone] call daughter Shelly Rubenstein when pt is d/c.

## 2021-05-20 NOTE — ED Notes (Signed)
Vascular called and stated pt has cephalic clot in right armed. Notified EDP results.

## 2021-05-20 NOTE — ED Notes (Signed)
Patient transported to CT 

## 2021-05-20 NOTE — ED Notes (Signed)
Pt hard stick placed IV consult for CT angio

## 2021-05-20 NOTE — Progress Notes (Signed)
Upper extremity venous has been completed.   Preliminary results in CV Proc.   Jinny Blossom Jayln Branscom 05/20/2021 5:28 PM

## 2021-05-20 NOTE — Discharge Instructions (Addendum)
Follow-up with your cardiologist.  Apply heat and elevate the arm.  You may take over-the-counter anti-inflammatory medications such as ibuprofen for any pain.

## 2021-05-20 NOTE — ED Notes (Signed)
Pt states no chest pain at moment. Endorses feeling sick to stomach

## 2021-05-20 NOTE — ED Notes (Signed)
Contacted CT to inform pt has an IV

## 2021-05-20 NOTE — ED Triage Notes (Signed)
Pt BIB EMS from home c/o midsternal chest pain with nausea started 2hrs ago. Here on 09/09 for heart cath and is scheduled for an aortic valve placement on 09/27. [place] speaking. Actively vomiting at the moment.

## 2021-05-21 ENCOUNTER — Emergency Department (HOSPITAL_COMMUNITY): Payer: Medicare (Managed Care)

## 2021-05-21 DIAGNOSIS — I82890 Acute embolism and thrombosis of other specified veins: Secondary | ICD-10-CM

## 2021-05-21 HISTORY — DX: Acute embolism and thrombosis of other specified veins: I82.890

## 2021-05-21 MED ORDER — IOHEXOL 350 MG/ML SOLN
80.0000 mL | Freq: Once | INTRAVENOUS | Status: AC | PRN
  Administered 2021-05-21: 80 mL via INTRAVENOUS

## 2021-05-21 NOTE — ED Provider Notes (Signed)
Patient signed out pending CTA rule out PE.  CTA reviewed by myself.  No evidence of pulmonary embolism.  She does have a superficial vein thrombosis.  Likely related to recent procedure.  Does not require anticoagulation.  Recommend heat and elevation as well as anti-inflammatory medications.  Follow-up closely with cardiologist.  After history, exam, and medical workup I feel the patient has been appropriately medically screened and is safe for discharge home. Pertinent diagnoses were discussed with the patient. Patient was given return precautions.  Physical Exam  BP (!) 130/47   Pulse 73   Temp 97.7 F (36.5 C)   Resp 18   SpO2 98%   Physical Exam  ED Course/Procedures     Problem List Items Addressed This Visit   None Visit Diagnoses     Superficial venous thrombosis of arm, right    -  Primary             Hendrix Console, Barbette Hair, MD 05/21/21 [phone]

## 2021-05-23 ENCOUNTER — Ambulatory Visit
Admission: RE | Admit: 2021-05-23 | Discharge: 2021-05-23 | Disposition: A | Discharge status: Home / Self Care | Payer: Medicare (Managed Care) | Source: Ambulatory Visit | Attending: Vascular Surgery | Admitting: Vascular Surgery

## 2021-05-23 ENCOUNTER — Other Ambulatory Visit: Payer: Self-pay

## 2021-05-23 DIAGNOSIS — Z8739 Personal history of other diseases of the musculoskeletal system and connective tissue: Secondary | ICD-10-CM

## 2021-06-03 ENCOUNTER — Encounter (HOSPITAL_COMMUNITY): Payer: Medicare (Managed Care)

## 2021-06-04 ENCOUNTER — Ambulatory Visit (HOSPITAL_COMMUNITY): Admit: 2021-06-04 | Payer: Medicare (Managed Care)

## 2021-06-04 ENCOUNTER — Encounter (HOSPITAL_COMMUNITY): Payer: Self-pay

## 2021-06-04 ENCOUNTER — Encounter: Payer: Self-pay | Admitting: Physician Assistant

## 2021-06-14 ENCOUNTER — Ambulatory Visit (HOSPITAL_COMMUNITY)
Admission: RE | Admit: 2021-06-14 | Discharge: 2021-06-14 | Disposition: A | Discharge status: Home / Self Care | Payer: Medicare (Managed Care) | Source: Ambulatory Visit | Attending: Cardiovascular Disease | Admitting: Cardiovascular Disease

## 2021-06-14 ENCOUNTER — Other Ambulatory Visit: Payer: Self-pay

## 2021-06-14 DIAGNOSIS — I350 Nonrheumatic aortic (valve) stenosis: Secondary | ICD-10-CM

## 2021-06-14 MED ORDER — METOPROLOL TARTRATE 5 MG/5ML IV SOLN
INTRAVENOUS | Status: AC
  Filled 2021-06-14: qty 5

## 2021-06-14 MED ORDER — IOHEXOL 350 MG/ML SOLN
125.0000 mL | Freq: Once | INTRAVENOUS | Status: AC | PRN
  Administered 2021-06-14: 125 mL via INTRAVENOUS

## 2021-06-14 MED ORDER — METOPROLOL TARTRATE 5 MG/5ML IV SOLN
5.0000 mg | INTRAVENOUS | Status: DC | PRN
Start: 2021-06-14 — End: 2021-06-15
  Administered 2021-06-14: 5 mg via INTRAVENOUS

## 2021-06-14 MED ORDER — METOPROLOL TARTRATE 5 MG/5ML IV SOLN
INTRAVENOUS | Status: AC
  Filled 2021-06-14: qty 10

## 2021-06-14 NOTE — Progress Notes (Signed)
Carotid artery duplex has been completed. Preliminary results can be found in CV Proc through chart review.   06/14/21 11:01 AM Krystal Maddox RVT

## 2021-06-26 ENCOUNTER — Encounter: Payer: Medicare (Managed Care) | Admitting: Surgery

## 2021-07-03 ENCOUNTER — Other Ambulatory Visit: Payer: Self-pay

## 2021-07-03 ENCOUNTER — Ambulatory Visit
Admission: RE | Admit: 2021-07-03 | Discharge: 2021-07-03 | Disposition: A | Discharge status: Home / Self Care | Payer: Medicare (Managed Care) | Source: Ambulatory Visit | Attending: Vascular Surgery | Admitting: Vascular Surgery

## 2021-07-03 ENCOUNTER — Other Ambulatory Visit: Payer: Self-pay | Admitting: Vascular Surgery

## 2021-07-03 DIAGNOSIS — Z0001 Encounter for general adult medical examination with abnormal findings: Secondary | ICD-10-CM

## 2021-07-12 ENCOUNTER — Ambulatory Visit: Payer: Medicare (Managed Care) | Admitting: Cardiovascular Disease

## 2021-07-24 ENCOUNTER — Other Ambulatory Visit: Payer: Self-pay

## 2021-07-24 ENCOUNTER — Ambulatory Visit (INDEPENDENT_AMBULATORY_CARE_PROVIDER_SITE_OTHER): Payer: Medicare (Managed Care) | Admitting: Cardiovascular Disease

## 2021-07-24 ENCOUNTER — Encounter: Payer: Self-pay | Admitting: Cardiovascular Disease

## 2021-07-24 VITALS — BP 148/60 | HR 80 | Ht 59.0 in | Wt 141.8 lb

## 2021-07-24 DIAGNOSIS — I350 Nonrheumatic aortic (valve) stenosis: Secondary | ICD-10-CM | POA: Diagnosis not present

## 2021-07-24 NOTE — Patient Instructions (Signed)
Medication Instructions:  NO CHANGES *If you need a refill on your cardiac medications before your next appointment, please call your pharmacy*   Lab Work: NONE If you have labs (blood work) drawn today and your tests are completely normal, you will receive your results only by: [place] (if you have MyChart) OR A paper copy in the mail If you have any lab test that is abnormal or we need to change your treatment, we will call you to review the results.   Testing/Procedures: NONE   Follow-Up: At [facility], you and your health needs are our priority.  As part of our continuing mission to provide you with exceptional heart care, we have created designated Provider Care Teams.  These Care Teams include your primary Cardiologist (physician) and Advanced Practice Providers (APPs -  Physician Assistants and Nurse Practitioners) who all work together to provide you with the care you need, when you need it.  We recommend signing up for the patient portal called "MyChart".  Sign up information is provided on this After Visit Summary.  MyChart is used to connect with patients for Virtual Visits (Telemedicine).  Patients are able to view lab/test results, encounter notes, upcoming appointments, etc.  Non-urgent messages can be sent to your provider as well.   To learn more about what you can do with MyChart, go to [email or web address].    Your next appointment:   6 month(s)  The format for your next appointment:   In Person  Provider:   Mertie Moores, MD     Other Instructions NONE

## 2021-07-24 NOTE — Progress Notes (Signed)
The patient is here by mistake today.  She was brought here by [facility].  She has a severe aortic stenosis and has already had a heart catheterization and evaluation by Dr. Angelena Form.  She is scheduled to have a second opinion with Dr. Cyndia Bent.  Her next appointment is with Dr. Cyndia Bent on November 30.  She is not having any chest pains or shortness of breath.  In fact she is feeling quite a bit better.  Anticipate seeing her again in 6 months.  She will be working with the TAVR team for the next foreseeable future.     Mertie Moores, MD  07/24/2021 4:20 PM    [facility] [address],  [place],   [address] Phone: [phone]; Fax: [phone]

## 2021-08-07 ENCOUNTER — Other Ambulatory Visit: Payer: Self-pay

## 2021-08-07 ENCOUNTER — Encounter: Payer: Self-pay | Admitting: Surgery

## 2021-08-07 ENCOUNTER — Institutional Professional Consult (permissible substitution) (INDEPENDENT_AMBULATORY_CARE_PROVIDER_SITE_OTHER): Payer: Medicare (Managed Care) | Admitting: Surgery

## 2021-08-07 VITALS — BP 151/72 | HR 82 | Resp 20 | Ht 59.0 in | Wt 141.0 lb

## 2021-08-07 DIAGNOSIS — I350 Nonrheumatic aortic (valve) stenosis: Secondary | ICD-10-CM | POA: Diagnosis not present

## 2021-08-07 NOTE — Progress Notes (Signed)
Pre Surgical Assessment: 5 M Walk Test  40M=16.80ft  5 Meter Walk Test- trial 1: 10.69 seconds 5 Meter Walk Test- trial 2: 9.22 seconds 5 Meter Walk Test- trial 3: 9.00 seconds 5 Meter Walk Test Average: 9.63 seconds

## 2021-08-07 NOTE — Progress Notes (Signed)
Patient ID: Krystal Maddox, female   DOB: 15-Oct-1940, 80 y.o.   MRN: [id number]  [facility]         [address]       [place] [address]             [phone]          CARDIOTHORACIC SURGERY CONSULTATION REPORT  PCP is Avva, Steva Ready, MD Referring Provider is Lauree Chandler, MD Primary Cardiologist is Mertie Moores, MD  Reason for consultation:  Severe aortic stenosis  HPI:  The patient is an 80 year old 35 woman who does not speak any English but is here today with an interpreter.  She has history of hypertension, stage IV chronic kidney disease, peripheral arterial disease, and severe aortic stenosis who was referred for consideration of TAVR.  She has been followed by Dr. Acie Fredrickson for moderate aortic stenosis since 2021.  An echocardiogram at that time showed a mean gradient across aortic valve of 36 mmHg.  She was seen by Dr. Acie Fredrickson in May 2022 and reported a syncopal episode as well as increased shortness of breath with exertion.  A follow-up echocardiogram on 01/03/2021 showed a calcified aortic valve with thickened leaflets and restricted mobility.  The mean gradient had increased to 54 mmHg with a peak of 91 mmHg.  Aortic valve area was 0.56 cm with a dimensionless index of 0.22.  She reports progressive exertional shortness of breath and fatigue.  She has had some dizziness but no further syncopal episodes.  She denies any chest pain or pressure.  She denies lower extremity edema.   Past Medical History:  Diagnosis Date   CKD (chronic kidney disease), stage III (HCC)    Hypertension    Murmur    Onychomycosis    Osteoporosis    Peripheral arterial disease ([place])    OF LEFT EXTREMITY    Primary osteoarthritis of right knee 05/26/2017   Seasonal allergies    Severe aortic stenosis    Vitamin D deficiency     Past Surgical History:  Procedure Laterality Date   KNEE SURGERY Left     RIGHT/LEFT HEART CATH AND CORONARY ANGIOGRAPHY N/A 05/17/2021   Procedure: RIGHT/LEFT HEART CATH AND CORONARY ANGIOGRAPHY;  Surgeon: Burnell Blanks, MD;  Location: [facility];  Service: Cardiovascular;  Laterality: N/A;    Family History  Problem Relation Age of Onset   Hypertension Mother     Social History   Socioeconomic History   Marital status: Unknown    Spouse name: Not on file   Number of children: Not on file   Years of education: Not on file   Highest education level: Not on file  Occupational History   Not on file  Tobacco Use   Smoking status: Never   Smokeless tobacco: Never  Vaping Use   Vaping Use: Never used  Substance and Sexual Activity   Alcohol use: Never   Drug use: Never   Sexual activity: Not on file  Other Topics Concern   Not on file  Social History Narrative   Not on file   Social Determinants of Health   Financial Resource Strain: Not on file  Food Insecurity: Not on file  Transportation Needs: Not on file  Physical Activity: Not on file  Stress: Not on file  Social Connections: Not on file  Intimate Partner Violence: Not on file    Prior to Admission medications   Medication Sig  Start Date End Date Taking? Authorizing Provider  acetaminophen (TYLENOL) 650 MG CR tablet Take 650 mg by mouth in the morning and at bedtime.   Yes [provider]  amLODipine (NORVASC) 10 MG tablet Take 10 mg by mouth daily.  03/18/17  Yes [provider]  aspirin EC 81 MG tablet Take 81 mg by mouth daily.   Yes [provider]  Calcium Carb-Cholecalciferol (CALCIUM 600+D3 PO) Take 1 tablet by mouth in the morning and at bedtime.   Yes [provider]  ferrous sulfate 325 (65 FE) MG tablet Take 325 mg by mouth daily.   Yes [provider]  losartan-hydrochlorothiazide (HYZAAR) 100-12.5 MG tablet Take 1 tablet by mouth daily. 05/06/17  Yes [provider]  Menthol, Topical Analgesic, (BIOFREEZE)  4 % GEL Apply 1 application topically 4 (four) times daily as needed (joint/muscle pain).   Yes [provider]  Menthol-Methyl Salicylate (SALONPAS PAIN RELIEF PATCH EX) Place 1 patch onto the skin every 8 (eight) hours as needed (pain).   Yes [provider]  Multiple Vitamins-Minerals (PRESERVISION AREDS 2 PO) Take 1 tablet by mouth in the morning and at bedtime.   Yes [provider]  Polyethyl Glyc-Propyl Glyc PF (SYSTANE HYDRATION PF) 0.4-0.3 % SOLN Place 1 drop into both eyes 4 (four) times daily as needed (dry eyes).   Yes [provider]  pravastatin (PRAVACHOL) 80 MG tablet Take 80 mg by mouth daily.   Yes [provider]  senna-docusate (SENOKOT-S) 8.6-50 MG tablet Take 2 tablets by mouth at bedtime.   Yes [provider]  Skin Protectants, Misc. (EUCERIN) cream Apply 1 application topically in the morning and at bedtime.   Yes [provider]  tolnaftate (TINACTIN) 1 % spray Apply 1 spray topically daily. Applied to feet   Yes [provider]    Current Outpatient Medications  Medication Sig Dispense Refill   acetaminophen (TYLENOL) 650 MG CR tablet Take 650 mg by mouth in the morning and at bedtime.     amLODipine (NORVASC) 10 MG tablet Take 10 mg by mouth daily.   0   aspirin EC 81 MG tablet Take 81 mg by mouth daily.     Calcium Carb-Cholecalciferol (CALCIUM 600+D3 PO) Take 1 tablet by mouth in the morning and at bedtime.     ferrous sulfate 325 (65 FE) MG tablet Take 325 mg by mouth daily.     losartan-hydrochlorothiazide (HYZAAR) 100-12.5 MG tablet Take 1 tablet by mouth daily.  0   Menthol, Topical Analgesic, (BIOFREEZE) 4 % GEL Apply 1 application topically 4 (four) times daily as needed (joint/muscle pain).     Menthol-Methyl Salicylate (SALONPAS PAIN RELIEF PATCH EX) Place 1 patch onto the skin every 8 (eight) hours as needed (pain).     Multiple Vitamins-Minerals (PRESERVISION AREDS 2 PO) Take 1 tablet  by mouth in the morning and at bedtime.     Polyethyl Glyc-Propyl Glyc PF (SYSTANE HYDRATION PF) 0.4-0.3 % SOLN Place 1 drop into both eyes 4 (four) times daily as needed (dry eyes).     pravastatin (PRAVACHOL) 80 MG tablet Take 80 mg by mouth daily.     senna-docusate (SENOKOT-S) 8.6-50 MG tablet Take 2 tablets by mouth at bedtime.     Skin Protectants, Misc. (EUCERIN) cream Apply 1 application topically in the morning and at bedtime.     tolnaftate (TINACTIN) 1 % spray Apply 1 spray topically daily. Applied to feet     No current  facility-administered medications for this visit.    Allergies  Allergen Reactions   Tylenol [Acetaminophen]     Pt reports long time ago she got sick on tylenol she not sure what happen but takes Tylenol now.      Review of Systems:   General:  normal appetite, + decreased energy, no weight gain, no weight loss, no fever  Cardiac:  no chest pain with exertion, no chest pain at rest, +SOB with moderate exertion, no resting SOB, no PND, no orthopnea, no palpitations, no arrhythmia, no atrial fibrillation, no LE edema, + dizzy spells, no syncope  Respiratory:  +exertional shortness of breath, no home oxygen, no productive cough, no dry cough, no bronchitis, no wheezing, no hemoptysis, no asthma, no pain with inspiration or cough, no sleep apnea, no CPAP at night  GI:   no difficulty swallowing, no reflux, no frequent heartburn, no hiatal hernia, no abdominal pain, no constipation, no diarrhea, no hematochezia, no hematemesis, no melena  GU:   no dysuria,  no frequency, no urinary tract infection, no hematuria,  no kidney stones, + kidney disease  Vascular:  no pain suggestive of claudication, no pain in feet, no leg cramps, no varicose veins, no DVT, no non-healing foot ulcer  Neuro:   no stroke, no TIA's, no seizures, no headaches, no temporary blindness one eye,  no slurred speech, no peripheral neuropathy, no chronic pain, no instability of gait, no  memory/cognitive dysfunction  Musculoskeletal: no arthritis, no joint swelling, no myalgias, no difficulty walking, normal mobility   Skin:   no rash, no itching, no skin infections, no pressure sores or ulcerations  Psych:   no anxiety, no depression, no nervousness, no unusual recent stress  Eyes:   no blurry vision, no floaters, no recent vision changes, + wears glasses or contacts  ENT:   + hearing loss, no loose or painful teeth, +partial dentures with no active dental issues  Hematologic:  no easy bruising, no abnormal bleeding, no clotting disorder, no frequent epistaxis  Endocrine:  no diabetes, does not check CBG's at home     Physical Exam:   BP (!) 151/72 (BP Location: Left Arm, Patient Position: Sitting)   Pulse 82   Resp 20   Ht 4\' 11"  (1.499 m)   Wt 141 lb (64 kg)   SpO2 96% Comment: RA  BMI 28.48 kg/m   General:  Elderly,  well-appearing  HEENT:  Unremarkable, NCAT, PERLA, EOMI  Neck:   no JVD, no bruits, no adenopathy   Chest:   clear to auscultation, symmetrical breath sounds, no wheezes, no rhonchi   CV:   RRR, 3/6 systolic murmur RSB, no diastolic murmur  Abdomen:  soft, non-tender, no masses   Extremities:  warm, well-perfused, pulses palpable at ankles, no lower extremity edema  Rectal/GU  Deferred  Neuro:   Grossly non-focal and symmetrical throughout  Skin:   Clean and dry, no rashes, no breakdown  Diagnostic Tests:    ECHOCARDIOGRAM REPORT         Patient Name:   Krystal Maddox Date of Exam: 01/03/2021  Medical Rec #:  [id number]     Height:       59.0 in  Accession #:    [id number]    Weight:       146.8 lb  Date of Birth:  12-18-1940      BSA:          1.617 m  Patient Age:    40 years  BP:           168/75 mmHg  Patient Gender: F             HR:           66 bpm.  Exam Location:  [address]   Procedure: 2D Echo, Strain Analysis, Cardiac Doppler and Color Doppler   Indications:    I35.0 Aortic Stenosis     History:        Patient has prior  history of Echocardiogram examinations,  most                  recent 12/19/2019. Signs/Symptoms:Murmur and Shortness of  Breath;                  Risk Factors:Hypertension and CKD.     Sonographer:    Marygrace Drought RCS  Referring Phys: [place]     1. Left ventricular ejection fraction, by estimation, is 60 to 65%. The  left ventricle has normal function. The left ventricle has no regional  wall motion abnormalities. There is mild concentric left ventricular  hypertrophy. Left ventricular diastolic  parameters are consistent with Grade I diastolic dysfunction (impaired  relaxation). The average left ventricular global longitudinal strain is  -17.2 %. The global longitudinal strain is normal.   2. Right ventricular systolic function is normal. The right ventricular  size is normal. There is mildly elevated pulmonary artery systolic  pressure.   3. Left atrial size was mild to moderately dilated.   4. Right atrial size was mildly dilated.   5. The mitral valve is normal in structure. Trivial mitral valve  regurgitation.   6. The aortic valve is calcified. There is severe calcifcation of the  aortic valve. There is severe thickening of the aortic valve. Aortic valve  regurgitation is trivial. Severe aortic valve stenosis. Aortic valve area,  by VTI measures 0.56 cm. Aortic  valve mean gradient measures 54.0 mmHg. Aortic valve Vmax measures 4.76  m/s.   7. The inferior vena cava is normal in size with <50% respiratory  variability, suggesting right atrial pressure of 8 mmHg.   Comparison(s): Changes from prior study are noted. Prior study noted  severe aortic stenosis, but gradients have increased since then, supports  worsening aortic stenosis.   FINDINGS   Left Ventricle: Left ventricular ejection fraction, by estimation, is 60  to 65%. The left ventricle has normal function. The left ventricle has no  regional wall motion abnormalities. The average  left ventricular global  longitudinal strain is -17.2 %.  The global longitudinal strain is normal. The left ventricular internal  cavity size was normal in size. There is mild concentric left ventricular  hypertrophy. Left ventricular diastolic parameters are consistent with  Grade I diastolic dysfunction  (impaired relaxation).   Right Ventricle: The right ventricular size is normal. No increase in  right ventricular wall thickness. Right ventricular systolic function is  normal. There is mildly elevated pulmonary artery systolic pressure. The  tricuspid regurgitant velocity is 2.82   m/s, and with an assumed right atrial pressure of 8 mmHg, the estimated  right ventricular systolic pressure is 96.2 mmHg.   Left Atrium: Left atrial size was mild to moderately dilated.   Right Atrium: Right atrial size was mildly dilated.   Pericardium: Trivial pericardial effusion is present. Presence of  pericardial fat pad.   Mitral Valve: The mitral valve is normal in structure. Mild to moderate  mitral annular calcification. Trivial mitral valve regurgitation.   Tricuspid Valve: The tricuspid valve is normal in structure. Tricuspid  valve regurgitation is trivial.   Aortic Valve: The aortic valve is calcified. There is severe calcifcation  of the aortic valve. There is severe thickening of the aortic valve.  Aortic valve regurgitation is trivial. Severe aortic stenosis is present.  Aortic valve mean gradient measures  54.0 mmHg. Aortic valve peak gradient measures 90.6 mmHg. Aortic valve  area, by VTI measures 0.56 cm.   Pulmonic Valve: The pulmonic valve was grossly normal. Pulmonic valve  regurgitation is trivial. No evidence of pulmonic stenosis.   Aorta: The aortic root, ascending aorta, aortic arch and descending aorta  are all structurally normal, with no evidence of dilitation or  obstruction.   Venous: The inferior vena cava is normal in size with less than 50%  respiratory  variability, suggesting right atrial pressure of 8 mmHg.   IAS/Shunts: The atrial septum is grossly normal.      LEFT VENTRICLE  PLAX 2D  LVIDd:         3.60 cm  Diastology  LVIDs:         2.70 cm  LV e' medial:    5.87 cm/s  LV PW:         1.30 cm  LV E/e' medial:  17.0  LV IVS:        1.20 cm  LV e' lateral:   6.96 cm/s  LVOT diam:     1.80 cm  LV E/e' lateral: 14.4  LV SV:         73  LV SV Index:   45       2D Longitudinal Strain  LVOT Area:     2.54 cm 2D Strain GLS (A2C):   -18.3 %                          2D Strain GLS (A3C):   -14.9 %                          2D Strain GLS (A4C):   -18.2 %                          2D Strain GLS Avg:     -17.2 %   RIGHT VENTRICLE  RV Basal diam:  3.30 cm  RV S prime:     12.20 cm/s  TAPSE (M-mode): 1.9 cm  RVSP:           34.8 mmHg   LEFT ATRIUM             Index       RIGHT ATRIUM           Index  LA diam:        3.80 cm 2.35 cm/m  RA Pressure: 3.00 mmHg  LA Vol (A2C):   40.7 ml 25.17 ml/m RA Area:     14.10 cm  LA Vol (A4C):   48.0 ml 29.68 ml/m RA Volume:   35.50 ml  21.95 ml/m  LA Biplane Vol: 40.4 ml 24.98 ml/m   AORTIC VALVE  AV Area (Vmax):    0.55 cm  AV Area (Vmean):   0.56 cm  AV Area (VTI):     0.56 cm  AV Vmax:           476.00 cm/s  AV  Vmean:          346.000 cm/s  AV VTI:            1.300 m  AV Peak Grad:      90.6 mmHg  AV Mean Grad:      54.0 mmHg  LVOT Vmax:         102.00 cm/s  LVOT Vmean:        75.700 cm/s  LVOT VTI:          0.285 m  LVOT/AV VTI ratio: 0.22     AORTA  Ao Root diam: 2.80 cm  Ao Asc diam:  3.40 cm   MITRAL VALVE                TRICUSPID VALVE  MV Area (PHT):              TR Peak grad:   31.8 mmHg  MV Decel Time:              TR Vmax:        282.00 cm/s  MV E velocity: 100.00 cm/s  Estimated RAP:  3.00 mmHg  MV A velocity: 147.00 cm/s  RVSP:           34.8 mmHg  MV E/A ratio:  0.68                              SHUNTS                              Systemic VTI:  0.29 m                               Systemic Diam: 1.80 cm   Buford Dresser MD  Electronically signed by Buford Dresser MD  Signature Date/Time: 01/03/2021/8:33:21 PM         Final     Physicians  Panel Physicians Referring Physician Case Authorizing Physician  Burnell Blanks, MD (Primary)     Procedures  RIGHT/LEFT HEART CATH AND CORONARY ANGIOGRAPHY   Conclusion      Mid RCA lesion is 50% stenosed.   Ost LAD to Mid LAD lesion is 20% stenosed.   Moderate mid RCA stenosis that does not appear to be flow limiting Mild calcified proximal LAD stenosis Severe aortic stenosis (mean gradient 46.2 mmHg, peak to peak gradient 51 mmHg, AVA 0.76 cm2) Normal right and left heart pressures   Recommendations: Will continue workup for TAVR   Indications  Severe aortic stenosis [I35.0 (ICD-10-CM)]   Procedural Details  Technical Details Indication: 80 yo female with severe AS, TAVR workup  Procedure: The risks, benefits, complications, treatment options, and expected outcomes were discussed with the patient. The patient and/or family concurred with the proposed plan, giving informed consent. The patient was brought to the cath lab after IV hydration was given. The patient was sedated with Versed and Fentanyl. The IV catheter in the right antecubital vein was changed for a  5 [place] sheath. Right heart catheterization performed with a balloon tipped catheter. The right wrist was prepped and draped in a sterile fashion. 1% lidocaine was used for local anesthesia. Using the modified Seldinger access technique, a 5 French sheath was placed in the right radial artery. 3 mg Verapamil was given through the sheath. 3000 units IV heparin was given.  Standard diagnostic catheters were used to perform selective coronary angiography. The aortic valve was crossed with the JR4 and a J wire. The sheath was removed from the right radial artery and a Terumo hemostasis band was applied at the arteriotomy  site on the right wrist.      Estimated blood loss <50 mL.   During this procedure medications were administered to achieve and maintain moderate conscious sedation while the patient's heart rate, blood pressure, and oxygen saturation were continuously monitored and I was present face-to-face 100% of this time.   Medications (Filter: Administrations occurring from 1302 to 1355 on 05/17/21)  important  Continuous medications are totaled by the amount administered until 05/17/21 1355.   Heparin (Porcine) in NaCl 1000-0.9 UT/500ML-% SOLN (mL) Total volume:  1,000 mL Date/Time Rate/Dose/Volume Action   05/17/21 1315 500 mL Given   1315 500 mL Given    fentaNYL (SUBLIMAZE) injection (mcg) Total dose:  25 mcg Date/Time Rate/Dose/Volume Action   05/17/21 1318 25 mcg Given    midazolam (VERSED) injection (mg) Total dose:  1 mg Date/Time Rate/Dose/Volume Action   05/17/21 1318 1 mg Given    lidocaine (PF) (XYLOCAINE) 1 % injection (mL) Total volume:  4 mL Date/Time Rate/Dose/Volume Action   05/17/21 1329 2 mL Given   1333 2 mL Given    Radial Cocktail/Verapamil only (mL) Total volume:  10 mL Date/Time Rate/Dose/Volume Action   05/17/21 1334 10 mL Given    heparin sodium (porcine) injection (Units) Total dose:  3,000 Units Date/Time Rate/Dose/Volume Action   05/17/21 1336 3,000 Units Given    iohexol (OMNIPAQUE) 350 MG/ML injection (mL) Total volume:  40 mL Date/Time Rate/Dose/Volume Action   05/17/21 1345 40 mL Given    Sedation Time  Sedation Time Physician-1: 26 minutes 41 seconds Contrast  Medication Name Total Dose  iohexol (OMNIPAQUE) 350 MG/ML injection 40 mL   Radiation/Fluoro  Fluoro time: 3.6 (min) DAP: 7.1 (Gycm2) Cumulative Air Kerma: 131.9 (mGy) Coronary Findings  Diagnostic Dominance: Right Left Anterior Descending  Vessel is large.  Ost LAD to Mid LAD lesion is 20% stenosed. The lesion is calcified.    Left Circumflex  Vessel is large.     Right Coronary Artery  Vessel is large.  Mid RCA lesion is 50% stenosed.    Intervention   No interventions have been documented.   Coronary Diagrams  Diagnostic Dominance: Right Intervention  Implants     No implant documentation for this case.   Syngo Images   Show images for CARDIAC CATHETERIZATION Images on Long Term Storage   Show images for Krystal Maddox, Krystal Maddox Link to Procedure Log  Procedure Log    Hemo Data  Flowsheet Row Most Recent Value  Fick Cardiac Output 5.92 L/min  Fick Cardiac Output Index 3.73 (L/min)/BSA  Aortic Mean Gradient 46.15 mmHg  Aortic Peak Gradient 51 mmHg  Aortic Valve Area 0.76  Aortic Value Area Index 0.48 cm2/BSA  RA A Wave 4 mmHg  RA V Wave 5 mmHg  RA Mean 3 mmHg  RV Systolic Pressure 28 mmHg  RV Diastolic Pressure -1 mmHg  RV EDP 5 mmHg  PA Systolic Pressure 31 mmHg  PA Diastolic Pressure 5 mmHg  PA Mean 16 mmHg  PW A Wave 6 mmHg  PW V Wave 7 mmHg  PW Mean 5 mmHg  AO Systolic Pressure 409 mmHg  AO Diastolic Pressure 53 mmHg  AO Mean 87 mmHg  LV Systolic Pressure 811 mmHg  LV Diastolic Pressure 7 mmHg  LV EDP 11 mmHg  AOp Systolic Pressure 315 mmHg  AOp Diastolic Pressure 53 mmHg  AOp Mean Pressure 89 mmHg  LVp Systolic Pressure 176 mmHg  LVp Diastolic Pressure 7 mmHg  LVp EDP Pressure 10 mmHg  QP/QS 1  TPVR Index 4.3 HRUI  TSVR Index 23.36 HRUI  PVR SVR Ratio 0.13  TPVR/TSVR Ratio 0.18     ADDENDUM REPORT: 06/16/2021 14:18   CLINICAL DATA:  Aortic stenosis   Height assumed 4'11 "   EXAM: Cardiac TAVR CT   TECHNIQUE: The patient was scanned on a Siemens Force 160 slice scanner. A 120 kV retrospective scan was triggered in the descending thoracic aorta at 111 HU's. Gantry rotation speed was 270 msecs and collimation was .9 mm. No beta blockade or nitro were given. The 3D data set was reconstructed in 5% intervals of the R-R cycle. Systolic and diastolic phases were analyzed on a dedicated work station  using MPR, MIP and VRT modes. The patient received 125 cc of contrast.   FINDINGS: Aortic Valve: Severely thickened aortic valve with moderate calcification and reduced excursion the planimeter valve area is 0.797 Sq cm consistent with severe aortic stenosis   Number of leaflets: 3   LVOT calcification: None   Annular calcification: Minimal   Aortic Valve Calcium Score: 1093   Prosthetic Valve: NA   Mitral Valve:Minimal mitral annular calcification   Left ventricular function: 60% without wall motion abnormalities.   PFO noted.   Aortic Annulus Measurements   Major annulus diameter: 23 mm   Minor annulus diameter:18 mm   Annular perimeter: 66 mm   Annular area: 32 cm2   Aortic Root Measurements   Sinus of Valsalva perimeter: 79 mm   Sinotubular Junction: 25 mm   Ascending Thoracic Aorta: 30 mm   Aortic Arch: 30 mm   Descending Thoracic Aorta: 21 mm   Sinus of Valsalva Measurements:   Right coronary cusp width: 23 mm   Left coronary cusp width: 24 mm   Non coronary cusp width: 25 mm   Coronary Artery Height above Annulus:   Left Main: 12 mm   Right Coronary: 14 mm   Coronary Arteries: Insufficient protocol for full assessment   Coronary Calcium Score:   Left main: 64   Left anterior descending artery: 643   Left circumflex artery: 36   Right coronary artery: 36   Total: 778   Optimum Fluoroscopic Angle for Delivery: LAO 6, CRA 3   Valves for structural team consideration: 20 mm Edward Sapien Valve, 23 mm CoreValve   IMPRESSION: 1. Severe Aortic stenosis. Findings pertinent to TAVR procedure are detailed above.   2. Patient's total coronary artery calcium score is 778, statin therapy may be considered. Percentile data for the [place] community is limited.   3. PFO noted.   4. Aortic atherosclerosis noted.   RECOMMENDATIONS:   Coronary artery calcium (CAC) score is a strong predictor of incident coronary heart disease (CHD)  and provides predictive information beyond traditional risk factors. CAC scoring is reasonable to use in the decision to withhold, postpone, or initiate statin therapy in intermediate-risk or selected borderline-risk asymptomatic adults (age 15-75 years and LDL-C >=70 to <190 mg/dL) who do not have diabetes or established atherosclerotic cardiovascular disease (ASCVD).* In intermediate-risk (10-year ASCVD risk >=7.5% to <20%) adults or selected borderline-risk (10-year ASCVD risk >=5% to <7.5%) adults in whom a CAC score is measured for the purpose of making a treatment decision the following recommendations have been made:  If CAC = 0, it is reasonable to withhold statin therapy and reassess in 5 to 10 years, as long as higher risk conditions are absent (diabetes mellitus, family history of premature CHD in first degree relatives (males <55 years; females <65 years), cigarette smoking, LDL >=190 mg/dL or other independent risk factors).   If CAC is 1 to 99, it is reasonable to initiate statin therapy for patients >=24 years of age.   If CAC is >=100 or >=75th percentile, it is reasonable to initiate statin therapy at any age.   Cardiology referral should be considered for patients with CAC scores >=400 or >=75th percentile.   *2018 AHA/ACC/AACVPR/AAPA/ABC/ACPM/ADA/AGS/APhA/ASPC/NLA/PCNA Guideline on the Management of Blood Cholesterol: A Report of the American College of Cardiology/American Heart Association Task Force on Clinical Practice Guidelines. J Am Coll Cardiol. 2019;73(24):3168-3209.     Electronically Signed   By: Rudean Haskell M.D.   On: 06/16/2021 14:18    Addended by Werner Lean, MD on 06/16/2021  2:20 PM   Study Result  Narrative & Impression  EXAM: OVER-READ INTERPRETATION  CT CHEST   The following report is an over-read performed by radiologist Dr. Vinnie Langton of [facility], [place] on 06/14/2021. This over-read does not  include interpretation of cardiac or coronary anatomy or pathology. The coronary calcium score/coronary CTA interpretation by the cardiologist is attached.   COMPARISON:  Chest CTA 05/21/2021.   FINDINGS: Extracardiac findings will be described separately under dictation for contemporaneously obtained CTA chest, abdomen and pelvis.   IMPRESSION: Please see separate dictation for contemporaneously obtained CTA chest, abdomen and pelvis dated 06/14/2021 for full description of relevant extracardiac findings.   Electronically Signed: By: Vinnie Langton M.D. On: 06/14/2021 12:28      Narrative & Impression  CLINICAL DATA:  80 year old female with history of severe aortic stenosis. Preprocedural study prior to potential transcatheter aortic valve replacement (TAVR) procedure.   EXAM: CT ANGIOGRAPHY CHEST, ABDOMEN AND PELVIS   TECHNIQUE: Multidetector CT imaging through the chest, abdomen and pelvis was performed using the standard protocol during bolus administration of intravenous contrast. Multiplanar reconstructed images and MIPs were obtained and reviewed to evaluate the vascular anatomy.   CONTRAST:  138mL OMNIPAQUE IOHEXOL 350 MG/ML SOLN   COMPARISON:  Chest CTA 05/21/2021. No prior CT the abdomen and pelvis.   FINDINGS: CTA CHEST FINDINGS   Cardiovascular: Heart size is mildly enlarged. There is no significant pericardial fluid, thickening or pericardial calcification. There is aortic atherosclerosis, as well as atherosclerosis of the great vessels of the mediastinum and the coronary arteries, including calcified atherosclerotic plaque in the left main left anterior descending coronary arteries. Severe thickening calcification of the aortic valve.   Mediastinum/Lymph Nodes: No pathologically enlarged mediastinal or hilar lymph nodes. Esophagus is unremarkable in appearance. No axillary lymphadenopathy.   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 No acute consolidative airspace disease. No pleural effusions. No suspicious appearing pulmonary nodules or masses are noted.   Musculoskeletal/Soft Tissues: There are no aggressive appearing lytic or blastic lesions noted in the visualized portions of the skeleton.   CTA ABDOMEN AND PELVIS FINDINGS   Hepatobiliary: Several small hypervascular foci are noted in the liver, largest of which is in the periphery of segment 8 (axial image 97 of series 8), too small to characterize, but statistically likely to represent benign perfusion anomalies (transient hepatic attenuation differences). No other definite suspicious cystic or solid hepatic lesions are noted. No  intra or extrahepatic biliary ductal dilatation. Small partially calcified gallstones are noted in the gallbladder measuring up to 1 cm in diameter. Gallbladder is nearly completely decompressed, and there are no surrounding inflammatory changes to suggest an acute cholecystitis at this time.   Pancreas: No pancreatic mass. No pancreatic ductal dilatation. No pancreatic or peripancreatic fluid collections or inflammatory changes.   Spleen: 12 mm low-attenuation lesion in the medial aspect of the spleen, incompletely characterized on today's arterial phase examination, but likely a cyst or other benign lesion.   Adrenals/Urinary Tract: Mild bilateral renal atrophy and cortical thinning. Multiple low-attenuation lesions in the right kidney compatible with simple cysts, largest of which is a 1.8 cm exophytic low-attenuation lesion in the upper pole of the right kidney. Multiple other subcentimeter low-attenuation lesions in both kidneys, too small to definitively characterize, but also statistically likely to represent small cysts. No definite aggressive appearing renal lesions. No hydroureteronephrosis. Urinary bladder is normal in appearance. Bilateral adrenal  glands are normal in appearance.   Stomach/Bowel: Normal appearance of the stomach. No pathologic dilatation of small bowel or colon. Normal appendix.   Vascular/Lymphatic: Aortic atherosclerosis, with vascular findings and measurements pertinent to potential TAVR procedure, as detailed below. No aneurysm or dissection noted in the abdominal or pelvic vasculature.   Reproductive: Uterus and ovaries are unremarkable in appearance.   Other: No significant volume of ascites.  No pneumoperitoneum.   Musculoskeletal: There are no aggressive appearing lytic or blastic lesions noted in the visualized portions of the skeleton.   VASCULAR MEASUREMENTS PERTINENT TO TAVR:   AORTA:   Minimal Aortic Diameter-12 x 10 mm   Severity of Aortic Calcification-mild-to-moderate   RIGHT PELVIS:   Right Common Iliac Artery -   Minimal Diameter-7.5 x 6.2 mm   Tortuosity-mild   Calcification-mild   Right External Iliac Artery -   Minimal Diameter-5.8 x 6.0 mm   Tortuosity-mild   Calcification-none   Right Common Femoral Artery -   Minimal Diameter-5.6 x 6.0 mm   Tortuosity-mild   Calcification-mild   LEFT PELVIS:   Left Common Iliac Artery -   Minimal Diameter-7.2 x 7.1 mm   Tortuosity-mild   Calcification-mild   Left External Iliac Artery -   Minimal Diameter-5.7 x 5.5 mm   Tortuosity-mild   Calcification-none   Left Common Femoral Artery -   Minimal Diameter-5.6 x 5.6 mm   Tortuosity-mild   Calcification-none   Review of the MIP images confirms the above findings.   IMPRESSION: 1. Vascular findings and measurements pertinent to potential TAVR procedure, as detailed above. 2. Severe thickening and calcification of the aortic valve, compatible with reported clinical history of severe aortic stenosis. 3. Aortic atherosclerosis, in addition to left main and left anterior descending coronary artery disease. 4. Evidence of extensive air trapping in the lungs,  suggestive of small airways disease. 5. Cholelithiasis without evidence of acute cholecystitis at this time. 6. Additional incidental findings, as above.     Electronically Signed   By: Vinnie Langton M.D.   On: 06/14/2021 13:19     STS Risk Score: Risk of Mortality: 3.375% Renal Failure: 2.864% Permanent Stroke: 1.627% Prolonged Ventilation: 8.436% DSW Infection: 0.091% Reoperation: 3.914% Morbidity or Mortality: 13.185% Short Length of Stay: 26.942% Long Length of Stay: 7.033%   Impression:  This 80 year old woman has stage D, severe, symptomatic aortic stenosis with New York Heart Association class II symptoms of exertional fatigue and shortness of breath consistent with chronic diastolic congestive heart failure.  I have  personally reviewed her 2D echocardiogram, cardiac catheterization, and CTA studies.  Her echocardiogram from April 2022 showed a severely calcified aortic valve with thickened leaflets and restricted mobility.  The mean gradient was 54 mmHg with a valve area 0.56 cm consistent with severe aortic stenosis.  Left ventricular ejection fraction was normal.  Cardiac catheterization showed moderate stenosis of the RCA that did not appear to be flow-limiting with mild calcified proximal LAD stenosis.  The mean gradient was 46 mmHg consistent with severe aortic stenosis.  I agree that aortic valve replacement is indicated in this patient for relief of her symptoms and to prevent left ventricular deterioration.  Given her age I think that transcatheter aortic valve replacement would be the best option for her.  Her gated cardiac CTA shows a relatively small aortic annulus that appears suitable for either a 20 mm Sapien 3 valve or a 23 mm Medtronic Evolut Pro + valve.  I think the Medtronic valve is probably the best option for her.  Her abdominal and pelvic CTA appears to show adequate pelvic vascular anatomy to allow transfemoral insertion.  The patient was  counseled at length regarding treatment alternatives for management of severe symptomatic aortic stenosis. The risks and benefits of surgical intervention has been discussed in detail. Long-term prognosis with medical therapy was discussed. Alternative approaches such as conventional surgical aortic valve replacement, transcatheter aortic valve replacement, and palliative medical therapy were compared and contrasted at length. This discussion was placed in the context of the patient's own specific clinical presentation and past medical history. All of her questions have been addressed.   Following the decision to proceed with transcatheter aortic valve replacement, a discussion was held regarding what types of management strategies would be attempted intraoperatively in the event of life-threatening complications, including whether or not the patient would be considered a candidate for the use of cardiopulmonary bypass and/or conversion to open sternotomy for attempted surgical intervention.  I think she would be a candidate for emergent sternotomy to manage any intraoperative complications.  The patient is aware of the fact that transient use of cardiopulmonary bypass may be necessary. The patient has been advised of a variety of complications that might develop including but not limited to risks of death, stroke, paravalvular leak, aortic dissection or other major vascular complications, aortic annulus rupture, device embolization, cardiac rupture or perforation, mitral regurgitation, acute myocardial infarction, arrhythmia, heart block or bradycardia requiring permanent pacemaker placement, congestive heart failure, respiratory failure, renal failure, pneumonia, infection, other late complications related to structural valve deterioration or migration, or other complications that might ultimately cause a temporary or permanent loss of functional independence or other long term morbidity. The patient provides  full informed consent for the procedure as described and all questions were answered.      Plan:  She will be scheduled for transfemoral TAVR using a Medtronic valve on 08/13/2021.  I spent 60 minutes performing this consultation and > 50% of this time was spent face to face counseling and coordinating the care of this patient's severe symptomatic aortic stenosis.   Gaye Pollack, MD 08/07/2021 4:13 PM

## 2021-08-09 NOTE — Pre-Procedure Instructions (Signed)
Surgical Instructions    Your procedure is scheduled on Tuesday, August 13, 2021 at 9:15 AM.  Report to [facility] Main Entrance "A" at 7:15 A.M., then check in with the Admitting office.  Call this number if you have problems the morning of surgery:  [phone]   If you have any questions prior to your surgery date call [phone]: Open Monday-Friday 8am-4pm    Remember:  Do not eat or drink after midnight the night before your surgery   Continue taking all other medications including Aspirin without change through the day before surgery. On the morning of surgery do not take any medications.  As of today, STOP taking any Aleve, Naproxen, Ibuprofen, Motrin, Advil, Goody's, BC's, all herbal medications, fish oil, and all vitamins.                     Do NOT Smoke (Tobacco/Vaping) or drink Alcohol 24 hours prior to your procedure.  If you use a CPAP at night, you may bring all equipment for your overnight stay.   Contacts, glasses, piercing's, hearing aid's, dentures or partials may not be worn into surgery, please bring cases for these belongings.    For patients admitted to the hospital, discharge time will be determined by your treatment team.   Patients discharged the day of surgery will not be allowed to drive home, and someone needs to stay with them for 24 hours.  NO VISITORS WILL BE ALLOWED IN PRE-OP WHERE PATIENTS GET READY FOR SURGERY.  ONLY 1 SUPPORT PERSON MAY BE PRESENT IN THE WAITING ROOM WHILE YOU ARE IN SURGERY.  IF YOU ARE TO BE ADMITTED, ONCE YOU ARE IN YOUR ROOM YOU WILL BE ALLOWED TWO (2) VISITORS.  Minor children may have two parents present. Special consideration for safety and communication needs will be reviewed on a case by case basis.   Special instructions:   [facility]- Preparing For Surgery  Before surgery, you can play an important role. Because skin is not sterile, your skin needs to be as free of germs as possible. You can reduce the number of  germs on your skin by washing with CHG (chlorahexidine gluconate) Soap before surgery.  CHG is an antiseptic cleaner which kills germs and bonds with the skin to continue killing germs even after washing.    Oral Hygiene is also important to reduce your risk of infection.  Remember - BRUSH YOUR TEETH THE MORNING OF SURGERY WITH YOUR REGULAR TOOTHPASTE  Please do not use if you have an allergy to CHG or antibacterial soaps. If your skin becomes reddened/irritated stop using the CHG.  Do not shave (including legs and underarms) for at least 48 hours prior to first CHG shower. It is OK to shave your face.  Please follow these instructions carefully.   Shower the NIGHT BEFORE SURGERY and the MORNING OF SURGERY  If you chose to wash your hair, wash your hair first as usual with your normal shampoo.  After you shampoo, rinse your hair and body thoroughly to remove the shampoo.  Use CHG Soap as you would any other liquid soap. You can apply CHG directly to the skin and wash gently with a scrungie or a clean washcloth.   Apply the CHG Soap to your body ONLY FROM THE NECK DOWN.  Do not use on open wounds or open sores. Avoid contact with your eyes, ears, mouth and genitals (private parts). Wash Face and genitals (private parts)  with your normal soap.  Wash thoroughly, paying special attention to the area where your surgery will be performed.  Thoroughly rinse your body with warm water from the neck down.  DO NOT shower/wash with your normal soap after using and rinsing off the CHG Soap.  Pat yourself dry with a CLEAN TOWEL.  Wear CLEAN PAJAMAS to bed the night before surgery  Place CLEAN SHEETS on your bed the night before your surgery  DO NOT SLEEP WITH PETS.   Day of Surgery: Shower with CHG soap. Do not wear jewelry, make up, nail polish, gel polish, artificial nails, or any other type of covering on natural nails including finger and toenails. If patients have artificial nails, gel  coating, etc. that need to be removed by a nail salon please have this removed prior to surgery. Surgery may need to be canceled/delayed if the surgeon/ anesthesia feels like the patient is unable to be adequately monitored. Do not wear lotions, powders, perfumes, or deodorant. Do not shave 48 hours prior to surgery. Do not bring valuables to the hospital. [facility] is not responsible for any belongings or valuables. Wear Clean/Comfortable clothing the morning of surgery Remember to brush your teeth WITH YOUR REGULAR TOOTHPASTE.   Please read over the following fact sheets that you were given.   3 days prior to your procedure or After your COVID test   You are not required to quarantine however you are required to wear a well-fitting mask when you are out and around people not in your household. If your mask becomes wet or soiled, replace with a new one.   Wash your hands often with soap and water for 20 seconds or clean your hands with an alcohol-based hand sanitizer that contains at least 60% alcohol.   Do not share personal items.   Notify your provider:  o if you are in close contact with someone who has COVID  o or if you develop a fever of 100.4 or greater, sneezing, cough, sore throat, shortness of breath or body aches.

## 2021-08-12 ENCOUNTER — Encounter (HOSPITAL_COMMUNITY)
Admission: RE | Admit: 2021-08-12 | Discharge: 2021-08-12 | Disposition: A | Discharge status: Home / Self Care | Payer: Medicare (Managed Care) | Source: Ambulatory Visit | Attending: Cardiovascular Disease | Admitting: Cardiovascular Disease

## 2021-08-12 ENCOUNTER — Encounter: Payer: Self-pay | Admitting: Physician Assistant

## 2021-08-12 ENCOUNTER — Ambulatory Visit (HOSPITAL_COMMUNITY)
Admission: RE | Admit: 2021-08-12 | Discharge: 2021-08-12 | Disposition: A | Discharge status: Home / Self Care | Payer: Medicare (Managed Care) | Source: Ambulatory Visit | Attending: Cardiovascular Disease | Admitting: Cardiovascular Disease

## 2021-08-12 ENCOUNTER — Other Ambulatory Visit: Payer: Self-pay

## 2021-08-12 ENCOUNTER — Encounter (HOSPITAL_COMMUNITY): Payer: Self-pay

## 2021-08-12 VITALS — BP 129/57 | HR 85 | Temp 98.3°F | Resp 18 | Ht 59.0 in | Wt 138.9 lb

## 2021-08-12 DIAGNOSIS — I350 Nonrheumatic aortic (valve) stenosis: Secondary | ICD-10-CM | POA: Insufficient documentation

## 2021-08-12 DIAGNOSIS — Z01818 Encounter for other preprocedural examination: Secondary | ICD-10-CM | POA: Insufficient documentation

## 2021-08-12 DIAGNOSIS — Z20822 Contact with and (suspected) exposure to covid-19: Secondary | ICD-10-CM | POA: Insufficient documentation

## 2021-08-12 LAB — URINALYSIS, ROUTINE W REFLEX MICROSCOPIC
Bacteria, UA: NONE SEEN
Bilirubin Urine: NEGATIVE
Glucose, UA: NEGATIVE mg/dL
Hgb urine dipstick: NEGATIVE
Ketones, ur: NEGATIVE mg/dL
Nitrite: NEGATIVE
Protein, ur: NEGATIVE mg/dL
Specific Gravity, Urine: 1.011 (ref 1.005–1.030)
pH: 5 (ref 5.0–8.0)

## 2021-08-12 LAB — BLOOD GAS, ARTERIAL
Acid-Base Excess: 1.6 mmol/L (ref 0.0–2.0)
Bicarbonate: 25.5 mmol/L (ref 20.0–28.0)
Drawn by: 60286
FIO2: 21
O2 Saturation: 97.8 %
Patient temperature: 37
pCO2 arterial: 39.3 mmHg (ref 32.0–48.0)
pH, Arterial: 7.428 (ref 7.350–7.450)
pO2, Arterial: 96.7 mmHg (ref 83.0–108.0)

## 2021-08-12 LAB — COMPREHENSIVE METABOLIC PANEL
ALT: 13 U/L (ref 0–44)
AST: 21 U/L (ref 15–41)
Albumin: 3.2 g/dL — ABNORMAL LOW (ref 3.5–5.0)
Alkaline Phosphatase: 70 U/L (ref 38–126)
Anion gap: 12 (ref 5–15)
BUN: 21 mg/dL (ref 8–23)
CO2: 22 mmol/L (ref 22–32)
Calcium: 9.2 mg/dL (ref 8.9–10.3)
Chloride: 96 mmol/L — ABNORMAL LOW (ref 98–111)
Creatinine, Ser: 1.18 mg/dL — ABNORMAL HIGH (ref 0.44–1.00)
GFR, Estimated: 46 mL/min — ABNORMAL LOW (ref >60)
Glucose, Bld: 189 mg/dL — ABNORMAL HIGH (ref 70–99)
Potassium: 3.3 mmol/L — ABNORMAL LOW (ref 3.5–5.1)
Sodium: 130 mmol/L — ABNORMAL LOW (ref 135–145)
Total Bilirubin: 0.3 mg/dL (ref 0.3–1.2)
Total Protein: 8.1 g/dL (ref 6.5–8.1)

## 2021-08-12 LAB — TYPE AND SCREEN
ABO/RH(D): A POS
Antibody Screen: NEGATIVE

## 2021-08-12 LAB — CBC
HCT: 31.2 % — ABNORMAL LOW (ref 36.0–46.0)
Hemoglobin: 9.4 g/dL — ABNORMAL LOW (ref 12.0–15.0)
MCH: 25.5 pg — ABNORMAL LOW (ref 26.0–34.0)
MCHC: 30.1 g/dL (ref 30.0–36.0)
MCV: 84.8 fL (ref 80.0–100.0)
Platelets: 409 10*3/uL — ABNORMAL HIGH (ref 150–400)
RBC: 3.68 MIL/uL — ABNORMAL LOW (ref 3.87–5.11)
RDW: 14.6 % (ref 11.5–15.5)
WBC: 12.7 10*3/uL — ABNORMAL HIGH (ref 4.0–10.5)
nRBC: 0 % (ref 0.0–0.2)

## 2021-08-12 LAB — PROTIME-INR
INR: 1 (ref 0.8–1.2)
Prothrombin Time: 12.8 seconds (ref 11.4–15.2)

## 2021-08-12 LAB — BRAIN NATRIURETIC PEPTIDE: B Natriuretic Peptide: 56.4 pg/mL (ref 0.0–100.0)

## 2021-08-12 LAB — SARS CORONAVIRUS 2 (TAT 6-24 HRS): SARS Coronavirus 2: NEGATIVE

## 2021-08-12 LAB — SURGICAL PCR SCREEN
MRSA, PCR: NEGATIVE
Staphylococcus aureus: NEGATIVE

## 2021-08-12 MED ORDER — DEXMEDETOMIDINE HCL IN NACL 400 MCG/100ML IV SOLN
0.1000 ug/kg/h | INTRAVENOUS | Status: AC
  Administered 2021-08-13: 31.52 ug via INTRAVENOUS
  Administered 2021-08-13: 1 ug/kg/h via INTRAVENOUS
  Filled 2021-08-12 (×2): qty 100

## 2021-08-12 MED ORDER — HEPARIN 30,000 UNITS/1000 ML (OHS) CELLSAVER SOLUTION
Status: DC
  Filled 2021-08-12 (×2): qty 1000

## 2021-08-12 MED ORDER — CEFAZOLIN SODIUM-DEXTROSE 2-4 GM/100ML-% IV SOLN
2.0000 g | INTRAVENOUS | Status: AC
  Administered 2021-08-13: 2 g via INTRAVENOUS
  Filled 2021-08-12 (×2): qty 100

## 2021-08-12 MED ORDER — POTASSIUM CHLORIDE 2 MEQ/ML IV SOLN
80.0000 meq | INTRAVENOUS | Status: DC
  Filled 2021-08-12 (×2): qty 40

## 2021-08-12 MED ORDER — MAGNESIUM SULFATE 50 % IJ SOLN
40.0000 meq | INTRAMUSCULAR | Status: DC
  Filled 2021-08-12 (×2): qty 9.85

## 2021-08-12 MED ORDER — NOREPINEPHRINE 4 MG/250ML-% IV SOLN
0.0000 ug/min | INTRAVENOUS | Status: AC
  Administered 2021-08-13: 2 ug/min via INTRAVENOUS
  Filled 2021-08-12: qty 250

## 2021-08-12 NOTE — Progress Notes (Signed)
PCP - Dr. Dagmar Hait Cardiologist - Dr. Mertie Moores  Chest x-ray - 08/12/21 EKG - 08/12/21 Stress Test - Denies ECHO - 01/03/21 Cardiac Cath - 05/17/21  Sleep Study - Denies  DM - Denies  Aspirin Instructions: has continued through today   COVID TEST- 08/12/21  Anesthesia review: Yes cardiac history  Patient denies shortness of breath, fever, cough and chest pain at PAT appointment   All instructions explained to the patient, with a verbal understanding of the material. Patient agrees to go over the instructions while at home for a better understanding. Patient also instructed to wear a mask while in public after being tested for COVID-19. The opportunity to ask questions was provided.

## 2021-08-12 NOTE — H&P (Signed)
Krystal Maddox       Krystal Maddox,Krystal Maddox             [phone]      Cardiothoracic Surgery Admission History and Physical   PCP is Avva, Ravisankar, MD Referring Provider is Lauree Chandler, MD Primary Cardiologist is Mertie Moores, MD   Reason for admission:  Severe aortic stenosis   HPI:   The patient is an 80 year old 43 woman who does not speak any English but is here today with an interpreter.  She has history of hypertension, stage IV chronic kidney disease, peripheral arterial disease, and severe aortic stenosis who was referred for consideration of TAVR.  She has been followed by Dr. Acie Fredrickson for moderate aortic stenosis since 2021.  An echocardiogram at that time showed a mean gradient across aortic valve of 36 mmHg.  She was seen by Dr. Acie Fredrickson in May 2022 and reported a syncopal episode as well as increased shortness of breath with exertion.  A follow-up echocardiogram on 01/03/2021 showed a calcified aortic valve with thickened leaflets and restricted mobility.  The mean gradient had increased to 54 mmHg with a peak of 91 mmHg.  Aortic valve area was 0.56 cm with a dimensionless index of 0.22.  She reports progressive exertional shortness of breath and fatigue.  She has had some dizziness but no further syncopal episodes.  She denies any chest pain or pressure.  She denies lower extremity edema.         Past Medical History:  Diagnosis Date   CKD (chronic kidney disease), stage III (HCC)     Hypertension     Murmur     Onychomycosis     Osteoporosis     Peripheral arterial disease ([place])      OF LEFT EXTREMITY    Primary osteoarthritis of right knee 05/26/2017   Seasonal allergies     Severe aortic stenosis     Vitamin D deficiency             Past Surgical History:  Procedure Laterality Date   KNEE SURGERY Left     RIGHT/LEFT HEART CATH AND CORONARY ANGIOGRAPHY N/A 05/17/2021    Procedure: RIGHT/LEFT HEART CATH AND CORONARY  ANGIOGRAPHY;  Surgeon: Burnell Blanks, MD;  Location: [facility];  Service: Cardiovascular;  Laterality: N/A;           Family History  Problem Relation Age of Onset   Hypertension Mother        Social History         Socioeconomic History   Marital status: Unknown      Spouse name: Not on file   Number of children: Not on file   Years of education: Not on file   Highest education level: Not on file  Occupational History   Not on file  Tobacco Use   Smoking status: Never   Smokeless tobacco: Never  Vaping Use   Vaping Use: Never used  Substance and Sexual Activity   Alcohol use: Never   Drug use: Never   Sexual activity: Not on file  Other Topics Concern   Not on file  Social History Narrative   Not on file    Social Determinants of Health    Financial Resource Strain: Not on file  Food Insecurity: Not on file  Transportation Needs: Not on file  Physical Activity: Not on file  Stress: Not on file  Social Connections: Not on file  Intimate Partner Violence: Not on file             Prior to Admission medications   Medication Sig Start Date End Date Taking? Authorizing Provider  acetaminophen (TYLENOL) 650 MG CR tablet Take 650 mg by mouth in the morning and at bedtime.     Yes [provider]  amLODipine (NORVASC) 10 MG tablet Take 10 mg by mouth daily.  03/18/17   Yes [provider]  aspirin EC 81 MG tablet Take 81 mg by mouth daily.     Yes [provider]  Calcium Carb-Cholecalciferol (CALCIUM 600+D3 PO) Take 1 tablet by mouth in the morning and at bedtime.     Yes [provider]  ferrous sulfate 325 (65 FE) MG tablet Take 325 mg by mouth daily.     Yes [provider]  losartan-hydrochlorothiazide (HYZAAR) 100-12.5 MG tablet Take 1 tablet by mouth daily. 05/06/17   Yes [provider]  Menthol, Topical Analgesic, (BIOFREEZE) 4 % GEL Apply 1 application topically 4 (four) times daily as  needed (joint/muscle pain).     Yes [provider]  Menthol-Methyl Salicylate (SALONPAS PAIN RELIEF PATCH EX) Place 1 patch onto the skin every 8 (eight) hours as needed (pain).     Yes [provider]  Multiple Vitamins-Minerals (PRESERVISION AREDS 2 PO) Take 1 tablet by mouth in the morning and at bedtime.     Yes [provider]  Polyethyl Glyc-Propyl Glyc PF (SYSTANE HYDRATION PF) 0.4-0.3 % SOLN Place 1 drop into both eyes 4 (four) times daily as needed (dry eyes).     Yes [provider]  pravastatin (PRAVACHOL) 80 MG tablet Take 80 mg by mouth daily.     Yes [provider]  senna-docusate (SENOKOT-S) 8.6-50 MG tablet Take 2 tablets by mouth at bedtime.     Yes [provider]  Skin Protectants, Misc. (EUCERIN) cream Apply 1 application topically in the morning and at bedtime.     Yes [provider]  tolnaftate (TINACTIN) 1 % spray Apply 1 spray topically daily. Applied to feet     Yes [provider]            Current Outpatient Medications  Medication Sig Dispense Refill   acetaminophen (TYLENOL) 650 MG CR tablet Take 650 mg by mouth in the morning and at bedtime.       amLODipine (NORVASC) 10 MG tablet Take 10 mg by mouth daily.    0   aspirin EC 81 MG tablet Take 81 mg by mouth daily.       Calcium Carb-Cholecalciferol (CALCIUM 600+D3 PO) Take 1 tablet by mouth in the morning and at bedtime.       ferrous sulfate 325 (65 FE) MG tablet Take 325 mg by mouth daily.       losartan-hydrochlorothiazide (HYZAAR) 100-12.5 MG tablet Take 1 tablet by mouth daily.   0   Menthol, Topical Analgesic, (BIOFREEZE) 4 % GEL Apply 1 application topically 4 (four) times daily as needed (joint/muscle pain).       Menthol-Methyl Salicylate (SALONPAS PAIN RELIEF PATCH EX) Place 1 patch onto the skin every 8 (eight) hours as needed (pain).       Multiple Vitamins-Minerals (PRESERVISION AREDS 2 PO) Take 1 tablet by mouth in the morning  and at bedtime.       Polyethyl Glyc-Propyl Glyc PF (SYSTANE HYDRATION PF) 0.4-0.3 % SOLN Place 1 drop into both eyes 4 (four) times daily as  needed (dry eyes).       pravastatin (PRAVACHOL) 80 MG tablet Take 80 mg by mouth daily.       senna-docusate (SENOKOT-S) 8.6-50 MG tablet Take 2 tablets by mouth at bedtime.       Skin Protectants, Misc. (EUCERIN) cream Apply 1 application topically in the morning and at bedtime.       tolnaftate (TINACTIN) 1 % spray Apply 1 spray topically daily. Applied to feet        No current facility-administered medications for this visit.           Allergies  Allergen Reactions   Tylenol [Acetaminophen]        Pt reports long time ago she got sick on tylenol she not sure what happen but takes Tylenol now.          Review of Systems:               General:                      normal appetite, + decreased energy, no weight gain, no weight loss, no fever             Cardiac:                       no chest pain with exertion, no chest pain at rest, +SOB with moderate exertion, no resting SOB, no PND, no orthopnea, no palpitations, no arrhythmia, no atrial fibrillation, no LE edema, + dizzy spells, no syncope             Respiratory:                 +exertional shortness of breath, no home oxygen, no productive cough, no dry cough, no bronchitis, no wheezing, no hemoptysis, no asthma, no pain with inspiration or cough, no sleep apnea, no CPAP at night             GI:                               no difficulty swallowing, no reflux, no frequent heartburn, no hiatal hernia, no abdominal pain, no constipation, no diarrhea, no hematochezia, no hematemesis, no melena             GU:                              no dysuria,  no frequency, no urinary tract infection, no hematuria,  no kidney stones, + kidney disease             Vascular:                     no pain suggestive of claudication, no pain in feet, no leg cramps, no varicose veins, no DVT, no non-healing  foot ulcer             Neuro:                         no stroke, no TIA's, no seizures, no headaches, no temporary blindness one eye,  no slurred speech, no peripheral neuropathy, no chronic pain, no instability of gait, no memory/cognitive dysfunction             Musculoskeletal:  no arthritis, no joint swelling, no myalgias, no difficulty walking, normal mobility              Skin:                            no rash, no itching, no skin infections, no pressure sores or ulcerations             Psych:                         no anxiety, no depression, no nervousness, no unusual recent stress             Eyes:                           no blurry vision, no floaters, no recent vision changes, + wears glasses or contacts             ENT:                            + hearing loss, no loose or painful teeth, +partial dentures with no active dental issues             Hematologic:               no easy bruising, no abnormal bleeding, no clotting disorder, no frequent epistaxis             Endocrine:                   no diabetes, does not check CBG's at home                            Physical Exam:               BP (!) 151/72 (BP Location: Left Arm, Patient Position: Sitting)   Pulse 82   Resp 20   Ht 4\' 11"  (1.499 m)   Wt 141 lb (64 kg)   SpO2 96% Comment: RA  BMI 28.48 kg/m              General:                      Elderly,  well-appearing             HEENT:                       Unremarkable, NCAT, PERLA, EOMI             Neck:                           no JVD, no bruits, no adenopathy              Chest:                          clear to auscultation, symmetrical breath sounds, no wheezes, no rhonchi              CV:                              RRR, 3/6 systolic  murmur RSB, no diastolic murmur             Abdomen:                    soft, non-tender, no masses              Extremities:                 warm, well-perfused, pulses palpable at ankles, no lower extremity edema              Rectal/GU                   Deferred             Neuro:                         Grossly non-focal and symmetrical throughout             Skin:                            Clean and dry, no rashes, no breakdown   Diagnostic Tests:     ECHOCARDIOGRAM REPORT         Patient Name:   [facility] Date of Exam: 01/03/2021  Medical Rec #:  [id number]     Height:       59.0 in  Accession #:    [id number]    Weight:       146.8 lb  Date of Birth:  Jul 01, 1941      BSA:          1.617 m  Patient Age:    35 years      BP:           168/75 mmHg  Patient Gender: F             HR:           66 bpm.  Exam Location:  [address]   Procedure: 2D Echo, Strain Analysis, Cardiac Doppler and Color Doppler   Indications:    I35.0 Aortic Stenosis     History:        Patient has prior history of Echocardiogram examinations,  most                  recent 12/19/2019. Signs/Symptoms:Murmur and Shortness of  Breath;                  Risk Factors:Hypertension and CKD.     Sonographer:    Marygrace Drought RCS  Referring Phys: [place]     1. Left ventricular ejection fraction, by estimation, is 60 to 65%. The  left ventricle has normal function. The left ventricle has no regional  wall motion abnormalities. There is mild concentric left ventricular  hypertrophy. Left ventricular diastolic  parameters are consistent with Grade I diastolic dysfunction (impaired  relaxation). The average left ventricular global longitudinal strain is  -17.2 %. The global longitudinal strain is normal.   2. Right ventricular systolic function is normal. The right ventricular  size is normal. There is mildly elevated pulmonary artery systolic  pressure.   3. Left atrial size was mild to moderately dilated.   4. Right atrial size was mildly dilated.   5. The mitral valve is normal in structure. Trivial mitral valve  regurgitation.   6. The aortic valve  is calcified. There is severe calcifcation of  the  aortic valve. There is severe thickening of the aortic valve. Aortic valve  regurgitation is trivial. Severe aortic valve stenosis. Aortic valve area,  by VTI measures 0.56 cm. Aortic  valve mean gradient measures 54.0 mmHg. Aortic valve Vmax measures 4.76  m/s.   7. The inferior vena cava is normal in size with <50% respiratory  variability, suggesting right atrial pressure of 8 mmHg.   Comparison(s): Changes from prior study are noted. Prior study noted  severe aortic stenosis, but gradients have increased since then, supports  worsening aortic stenosis.   FINDINGS   Left Ventricle: Left ventricular ejection fraction, by estimation, is 60  to 65%. The left ventricle has normal function. The left ventricle has no  regional wall motion abnormalities. The average left ventricular global  longitudinal strain is -17.2 %.  The global longitudinal strain is normal. The left ventricular internal  cavity size was normal in size. There is mild concentric left ventricular  hypertrophy. Left ventricular diastolic parameters are consistent with  Grade I diastolic dysfunction  (impaired relaxation).   Right Ventricle: The right ventricular size is normal. No increase in  right ventricular wall thickness. Right ventricular systolic function is  normal. There is mildly elevated pulmonary artery systolic pressure. The  tricuspid regurgitant velocity is 2.82   m/s, and with an assumed right atrial pressure of 8 mmHg, the estimated  right ventricular systolic pressure is 47.6 mmHg.   Left Atrium: Left atrial size was mild to moderately dilated.   Right Atrium: Right atrial size was mildly dilated.   Pericardium: Trivial pericardial effusion is present. Presence of  pericardial fat pad.   Mitral Valve: The mitral valve is normal in structure. Mild to moderate  mitral annular calcification. Trivial mitral valve regurgitation.   Tricuspid Valve: The tricuspid valve is normal in structure.  Tricuspid  valve regurgitation is trivial.   Aortic Valve: The aortic valve is calcified. There is severe calcifcation  of the aortic valve. There is severe thickening of the aortic valve.  Aortic valve regurgitation is trivial. Severe aortic stenosis is present.  Aortic valve mean gradient measures  54.0 mmHg. Aortic valve peak gradient measures 90.6 mmHg. Aortic valve  area, by VTI measures 0.56 cm.   Pulmonic Valve: The pulmonic valve was grossly normal. Pulmonic valve  regurgitation is trivial. No evidence of pulmonic stenosis.   Aorta: The aortic root, ascending aorta, aortic arch and descending aorta  are all structurally normal, with no evidence of dilitation or  obstruction.   Venous: The inferior vena cava is normal in size with less than 50%  respiratory variability, suggesting right atrial pressure of 8 mmHg.   IAS/Shunts: The atrial septum is grossly normal.      LEFT VENTRICLE  PLAX 2D  LVIDd:         3.60 cm  Diastology  LVIDs:         2.70 cm  LV e' medial:    5.87 cm/s  LV PW:         1.30 cm  LV E/e' medial:  17.0  LV IVS:        1.20 cm  LV e' lateral:   6.96 cm/s  LVOT diam:     1.80 cm  LV E/e' lateral: 14.4  LV SV:         73  LV SV Index:   45       2D Longitudinal Strain  LVOT  Area:     2.54 cm 2D Strain GLS (A2C):   -18.3 %                          2D Strain GLS (A3C):   -14.9 %                          2D Strain GLS (A4C):   -18.2 %                          2D Strain GLS Avg:     -17.2 %   RIGHT VENTRICLE  RV Basal diam:  3.30 cm  RV S prime:     12.20 cm/s  TAPSE (M-mode): 1.9 cm  RVSP:           34.8 mmHg   LEFT ATRIUM             Index       RIGHT ATRIUM           Index  LA diam:        3.80 cm 2.35 cm/m  RA Pressure: 3.00 mmHg  LA Vol (A2C):   40.7 ml 25.17 ml/m RA Area:     14.10 cm  LA Vol (A4C):   48.0 ml 29.68 ml/m RA Volume:   35.50 ml  21.95 ml/m  LA Biplane Vol: 40.4 ml 24.98 ml/m   AORTIC VALVE  AV Area (Vmax):    0.55 cm   AV Area (Vmean):   0.56 cm  AV Area (VTI):     0.56 cm  AV Vmax:           476.00 cm/s  AV Vmean:          346.000 cm/s  AV VTI:            1.300 m  AV Peak Grad:      90.6 mmHg  AV Mean Grad:      54.0 mmHg  LVOT Vmax:         102.00 cm/s  LVOT Vmean:        75.700 cm/s  LVOT VTI:          0.285 m  LVOT/AV VTI ratio: 0.22     AORTA  Ao Root diam: 2.80 cm  Ao Asc diam:  3.40 cm   MITRAL VALVE                TRICUSPID VALVE  MV Area (PHT):              TR Peak grad:   31.8 mmHg  MV Decel Time:              TR Vmax:        282.00 cm/s  MV E velocity: 100.00 cm/s  Estimated RAP:  3.00 mmHg  MV A velocity: 147.00 cm/s  RVSP:           34.8 mmHg  MV E/A ratio:  0.68                              SHUNTS                              Systemic VTI:  0.29 m  Systemic Diam: 1.80 cm   Buford Dresser MD  Electronically signed by Buford Dresser MD  Signature Date/Time: 01/03/2021/8:33:21 PM         Final       Physicians   Panel Physicians Referring Physician Case Authorizing Physician  Burnell Blanks, MD (Primary)        Procedures   RIGHT/LEFT HEART CATH AND CORONARY ANGIOGRAPHY    Conclusion       Mid RCA lesion is 50% stenosed.   Ost LAD to Mid LAD lesion is 20% stenosed.   Moderate mid RCA stenosis that does not appear to be flow limiting Mild calcified proximal LAD stenosis Severe aortic stenosis (mean gradient 46.2 mmHg, peak to peak gradient 51 mmHg, AVA 0.76 cm2) Normal right and left heart pressures   Recommendations: Will continue workup for TAVR   Indications   Severe aortic stenosis [I35.0 (ICD-10-CM)]    Procedural Details   Technical Details Indication: 80 yo female with severe AS, TAVR workup  Procedure: The risks, benefits, complications, treatment options, and expected outcomes were discussed with the patient. The patient and/or family concurred with the proposed plan, giving informed consent.  The patient was brought to the cath lab after IV hydration was given. The patient was sedated with Versed and Fentanyl. The IV catheter in the right antecubital vein was changed for a  5 [place] sheath. Right heart catheterization performed with a balloon tipped catheter. The right wrist was prepped and draped in a sterile fashion. 1% lidocaine was used for local anesthesia. Using the modified Seldinger access technique, a 5 French sheath was placed in the right radial artery. 3 mg Verapamil was given through the sheath. 3000 units IV heparin was given. Standard diagnostic catheters were used to perform selective coronary angiography. The aortic valve was crossed with the JR4 and a J wire. The sheath was removed from the right radial artery and a Terumo hemostasis band was applied at the arteriotomy site on the right wrist.      Estimated blood loss <50 mL.   During this procedure medications were administered to achieve and maintain moderate conscious sedation while the patient's heart rate, blood pressure, and oxygen saturation were continuously monitored and I was present face-to-face 100% of this time.    Medications (Filter: Administrations occurring from 1302 to 1355 on 05/17/21)  important  Continuous medications are totaled by the amount administered until 05/17/21 1355.    Heparin (Porcine) in NaCl 1000-0.9 UT/500ML-% SOLN (mL) Total volume:  1,000 mL Date/Time Rate/Dose/Volume Action    05/17/21 1315 500 mL Given    1315 500 mL Given      fentaNYL (SUBLIMAZE) injection (mcg) Total dose:  25 mcg Date/Time Rate/Dose/Volume Action    05/17/21 1318 25 mcg Given      midazolam (VERSED) injection (mg) Total dose:  1 mg Date/Time Rate/Dose/Volume Action    05/17/21 1318 1 mg Given      lidocaine (PF) (XYLOCAINE) 1 % injection (mL) Total volume:  4 mL Date/Time Rate/Dose/Volume Action    05/17/21 1329 2 mL Given    1333 2 mL Given      Radial Cocktail/Verapamil only (mL) Total  volume:  10 mL Date/Time Rate/Dose/Volume Action    05/17/21 1334 10 mL Given      heparin sodium (porcine) injection (Units) Total dose:  3,000 Units Date/Time Rate/Dose/Volume Action    05/17/21 1336 3,000 Units Given      iohexol (OMNIPAQUE) 350 MG/ML injection (mL) Total volume:  40 mL Date/Time Rate/Dose/Volume Action    05/17/21 1345 40 mL Given      Sedation Time   Sedation Time Physician-1: 26 minutes 41 seconds Contrast   Medication Name Total Dose  iohexol (OMNIPAQUE) 350 MG/ML injection 40 mL    Radiation/Fluoro   Fluoro time: 3.6 (min) DAP: 7.1 (Gycm2) Cumulative Air Kerma: 131.9 (mGy) Coronary Findings   Diagnostic Dominance: Right Left Anterior Descending  Vessel is large.  Ost LAD to Mid LAD lesion is 20% stenosed. The lesion is calcified.    Left Circumflex  Vessel is large.    Right Coronary Artery  Vessel is large.  Mid RCA lesion is 50% stenosed.    Intervention    No interventions have been documented.    Coronary Diagrams   Diagnostic Dominance: Right Intervention   Implants      No implant documentation for this case.    [employer]ng[employer] Images    Show images for CARDIAC CATHETERIZATION Images on Long Term Storage    Show images for [place], [place] Link to Procedure Log   Procedure Log    Hemo Data   Flowsheet Row Most Recent Value  Fick Cardiac Output 5.92 L/min  Fick Cardiac Output Index 3.73 (L/min)/BSA  Aortic Mean Gradient 46.15 mmHg  Aortic Peak Gradient 51 mmHg  Aortic Valve Area 0.76  Aortic Value Area Index 0.48 cm2/BSA  RA A Wave 4 mmHg  RA V Wave 5 mmHg  RA Mean 3 mmHg  RV Systolic Pressure 28 mmHg  RV Diastolic Pressure -1 mmHg  RV EDP 5 mmHg  PA Systolic Pressure 31 mmHg  PA Diastolic Pressure 5 mmHg  PA Mean 16 mmHg  PW A Wave 6 mmHg  PW V Wave 7 mmHg  PW Mean 5 mmHg  AO Systolic Pressure 010 mmHg  AO Diastolic Pressure 53 mmHg  AO Mean 87 mmHg  LV Systolic Pressure 932 mmHg  LV Diastolic Pressure 7 mmHg   LV EDP 11 mmHg  AOp Systolic Pressure 355 mmHg  AOp Diastolic Pressure 53 mmHg  AOp Mean Pressure 89 mmHg  LVp Systolic Pressure 732 mmHg  LVp Diastolic Pressure 7 mmHg  LVp EDP Pressure 10 mmHg  QP/QS 1  TPVR Index 4.3 HRUI  TSVR Index 23.36 HRUI  PVR SVR Ratio 0.13  TPVR/TSVR Ratio 0.18        ADDENDUM REPORT: 06/16/2021 14:18   CLINICAL DATA:  Aortic stenosis   Height assumed 4'11 "   EXAM: Cardiac TAVR CT   TECHNIQUE: The patient was scanned on a Siemens Force 202 slice scanner. A 120 kV retrospective scan was triggered in the descending thoracic aorta at 111 HU's. Gantry rotation speed was 270 msecs and collimation was .9 mm. No beta blockade or nitro were given. The 3D data set was reconstructed in 5% intervals of the R-R cycle. Systolic and diastolic phases were analyzed on a dedicated work station using MPR, MIP and VRT modes. The patient received 125 cc of contrast.   FINDINGS: Aortic Valve: Severely thickened aortic valve with moderate calcification and reduced excursion the planimeter valve area is 0.797 Sq cm consistent with severe aortic stenosis   Number of leaflets: 3   LVOT calcification: None   Annular calcification: Minimal   Aortic Valve Calcium Score: 1093   Prosthetic Valve: NA   Mitral Valve:Minimal mitral annular calcification   Left ventricular function: 60% without wall motion abnormalities.   PFO noted.   Aortic Annulus Measurements   Major annulus diameter: 23 mm  Minor annulus diameter:18 mm   Annular perimeter: 66 mm   Annular area: 32 cm2   Aortic Root Measurements   Sinus of Valsalva perimeter: 79 mm   Sinotubular Junction: 25 mm   Ascending Thoracic Aorta: 30 mm   Aortic Arch: 30 mm   Descending Thoracic Aorta: 21 mm   Sinus of Valsalva Measurements:   Right coronary cusp width: 23 mm   Left coronary cusp width: 24 mm   Non coronary cusp width: 25 mm   Coronary Artery Height above Annulus:   Left  Main: 12 mm   Right Coronary: 14 mm   Coronary Arteries: Insufficient protocol for full assessment   Coronary Calcium Score:   Left main: 64   Left anterior descending artery: 643   Left circumflex artery: 36   Right coronary artery: 36   Total: 778   Optimum Fluoroscopic Angle for Delivery: LAO 6, CRA 3   Valves for structural team consideration: 20 mm Edward Sapien Valve, 23 mm CoreValve   IMPRESSION: 1. Severe Aortic stenosis. Findings pertinent to TAVR procedure are detailed above.   2. Patient's total coronary artery calcium score is 778, statin therapy may be considered. Percentile data for the [place] community is limited.   3. PFO noted.   4. Aortic atherosclerosis noted.   RECOMMENDATIONS:   Coronary artery calcium (CAC) score is a strong predictor of incident coronary heart disease (CHD) and provides predictive information beyond traditional risk factors. CAC scoring is reasonable to use in the decision to withhold, postpone, or initiate statin therapy in intermediate-risk or selected borderline-risk asymptomatic adults (age 15-75 years and LDL-C >=70 to <190 mg/dL) who do not have diabetes or established atherosclerotic cardiovascular disease (ASCVD).* In intermediate-risk (10-year ASCVD risk >=7.5% to <20%) adults or selected borderline-risk (10-year ASCVD risk >=5% to <7.5%) adults in whom a CAC score is measured for the purpose of making a treatment decision the following recommendations have been made:   If CAC = 0, it is reasonable to withhold statin therapy and reassess in 5 to 10 years, as long as higher risk conditions are absent (diabetes mellitus, family history of premature CHD in first degree relatives (males <55 years; females <65 years), cigarette smoking, LDL >=190 mg/dL or other independent risk factors).   If CAC is 1 to 99, it is reasonable to initiate statin therapy for patients >=58 years of age.   If CAC is >=100 or >=75th  percentile, it is reasonable to initiate statin therapy at any age.   Cardiology referral should be considered for patients with CAC scores >=400 or >=75th percentile.   *2018 AHA/ACC/AACVPR/AAPA/ABC/ACPM/ADA/AGS/APhA/ASPC/NLA/PCNA Guideline on the Management of Blood Cholesterol: A Report of the American College of Cardiology/American Heart Association Task Force on Clinical Practice Guidelines. J Am Coll Cardiol. 2019;73(24):3168-3209.     Electronically Signed   By: Rudean Haskell M.D.   On: 06/16/2021 14:18    Addended by Werner Lean, MD on 06/16/2021  2:20 PM    Study Result   Narrative & Impression  EXAM: OVER-READ INTERPRETATION  CT CHEST   The following report is an over-read performed by radiologist Dr. Vinnie Langton of [facility], [place] on 06/14/2021. This over-read does not include interpretation of cardiac or coronary anatomy or pathology. The coronary calcium score/coronary CTA interpretation by the cardiologist is attached.   COMPARISON:  Chest CTA 05/21/2021.   FINDINGS: Extracardiac findings will be described separately under dictation for contemporaneously obtained CTA chest, abdomen and pelvis.  IMPRESSION: Please see separate dictation for contemporaneously obtained CTA chest, abdomen and pelvis dated 06/14/2021 for full description of relevant extracardiac findings.   Electronically Signed: By: Vinnie Langton M.D. On: 06/14/2021 12:28        Narrative & Impression  CLINICAL DATA:  80 year old female with history of severe aortic stenosis. Preprocedural study prior to potential transcatheter aortic valve replacement (TAVR) procedure.   EXAM: CT ANGIOGRAPHY CHEST, ABDOMEN AND PELVIS   TECHNIQUE: Multidetector CT imaging through the chest, abdomen and pelvis was performed using the standard protocol during bolus administration of intravenous contrast. Multiplanar reconstructed images and MIPs were obtained and  reviewed to evaluate the vascular anatomy.   CONTRAST:  131mL OMNIPAQUE IOHEXOL 350 MG/ML SOLN   COMPARISON:  Chest CTA 05/21/2021. No prior CT the abdomen and pelvis.   FINDINGS: CTA CHEST FINDINGS   Cardiovascular: Heart size is mildly enlarged. There is no significant pericardial fluid, thickening or pericardial calcification. There is aortic atherosclerosis, as well as atherosclerosis of the great vessels of the mediastinum and the coronary arteries, including calcified atherosclerotic plaque in the left main left anterior descending coronary arteries. Severe thickening calcification of the aortic valve.   Mediastinum/Lymph Nodes: No pathologically enlarged mediastinal or hilar lymph nodes. Esophagus is unremarkable in appearance. No axillary lymphadenopathy.   Lungs/Pleura: Mosaic attenuation throughout the lungs, suggestive of extensive air trapping from small airways disease. No acute consolidative airspace disease. No pleural effusions. No suspicious appearing pulmonary nodules or masses are noted.   Musculoskeletal/Soft Tissues: There are no aggressive appearing lytic or blastic lesions noted in the visualized portions of the skeleton.   CTA ABDOMEN AND PELVIS FINDINGS   Hepatobiliary: Several small hypervascular foci are noted in the liver, largest of which is in the periphery of segment 8 (axial image 97 of series 8), too small to characterize, but statistically likely to represent benign perfusion anomalies (transient hepatic attenuation differences). No other definite suspicious cystic or solid hepatic lesions are noted. No intra or extrahepatic biliary ductal dilatation. Small partially calcified gallstones are noted in the gallbladder measuring up to 1 cm in diameter. Gallbladder is nearly completely decompressed, and there are no surrounding inflammatory changes to suggest an acute cholecystitis at this time.   Pancreas: No pancreatic mass. No pancreatic  ductal dilatation. No pancreatic or peripancreatic fluid collections or inflammatory changes.   Spleen: 12 mm low-attenuation lesion in the medial aspect of the spleen, incompletely characterized on today's arterial phase examination, but likely a cyst or other benign lesion.   Adrenals/Urinary Tract: Mild bilateral renal atrophy and cortical thinning. Multiple low-attenuation lesions in the right kidney compatible with simple cysts, largest of which is a 1.8 cm exophytic low-attenuation lesion in the upper pole of the right kidney. Multiple other subcentimeter low-attenuation lesions in both kidneys, too small to definitively characterize, but also statistically likely to represent small cysts. No definite aggressive appearing renal lesions. No hydroureteronephrosis. Urinary bladder is normal in appearance. Bilateral adrenal glands are normal in appearance.   Stomach/Bowel: Normal appearance of the stomach. No pathologic dilatation of small bowel or colon. Normal appendix.   Vascular/Lymphatic: Aortic atherosclerosis, with vascular findings and measurements pertinent to potential TAVR procedure, as detailed below. No aneurysm or dissection noted in the abdominal or pelvic vasculature.   Reproductive: Uterus and ovaries are unremarkable in appearance.   Other: No significant volume of ascites.  No pneumoperitoneum.   Musculoskeletal: There are no aggressive appearing lytic or blastic lesions noted in the visualized portions of the  skeleton.   VASCULAR MEASUREMENTS PERTINENT TO TAVR:   AORTA:   Minimal Aortic Diameter-12 x 10 mm   Severity of Aortic Calcification-mild-to-moderate   RIGHT PELVIS:   Right Common Iliac Artery -   Minimal Diameter-7.5 x 6.2 mm   Tortuosity-mild   Calcification-mild   Right External Iliac Artery -   Minimal Diameter-5.8 x 6.0 mm   Tortuosity-mild   Calcification-none   Right Common Femoral Artery -   Minimal Diameter-5.6 x 6.0  mm   Tortuosity-mild   Calcification-mild   LEFT PELVIS:   Left Common Iliac Artery -   Minimal Diameter-7.2 x 7.1 mm   Tortuosity-mild   Calcification-mild   Left External Iliac Artery -   Minimal Diameter-5.7 x 5.5 mm   Tortuosity-mild   Calcification-none   Left Common Femoral Artery -   Minimal Diameter-5.6 x 5.6 mm   Tortuosity-mild   Calcification-none   Review of the MIP images confirms the above findings.   IMPRESSION: 1. Vascular findings and measurements pertinent to potential TAVR procedure, as detailed above. 2. Severe thickening and calcification of the aortic valve, compatible with reported clinical history of severe aortic stenosis. 3. Aortic atherosclerosis, in addition to left main and left anterior descending coronary artery disease. 4. Evidence of extensive air trapping in the lungs, suggestive of small airways disease. 5. Cholelithiasis without evidence of acute cholecystitis at this time. 6. Additional incidental findings, as above.     Electronically Signed   By: Vinnie Langton M.D.   On: 06/14/2021 13:19      STS Risk Score: Risk of Mortality: 3.375% Renal Failure: 2.864% Permanent Stroke: 1.627% Prolonged Ventilation: 8.436% DSW Infection: 0.091% Reoperation: 3.914% Morbidity or Mortality: 13.185% Short Length of Stay: 26.942% Long Length of Stay: 7.033%   Impression:   This 80 year old woman has stage D, severe, symptomatic aortic stenosis with New York Heart Association class II symptoms of exertional fatigue and shortness of breath consistent with chronic diastolic congestive heart failure.  I have personally reviewed her 2D echocardiogram, cardiac catheterization, and CTA studies.  Her echocardiogram from April 2022 showed a severely calcified aortic valve with thickened leaflets and restricted mobility.  The mean gradient was 54 mmHg with a valve area 0.56 cm consistent with severe aortic stenosis.  Left  ventricular ejection fraction was normal.  Cardiac catheterization showed moderate stenosis of the RCA that did not appear to be flow-limiting with mild calcified proximal LAD stenosis.  The mean gradient was 46 mmHg consistent with severe aortic stenosis.  I agree that aortic valve replacement is indicated in this patient for relief of her symptoms and to prevent left ventricular deterioration.  Given her age I think that transcatheter aortic valve replacement would be the best option for her.  Her gated cardiac CTA shows a relatively small aortic annulus that appears suitable for either a 20 mm Sapien 3 valve or a 23 mm Medtronic Evolut Pro + valve.  I think the Medtronic valve is probably the best option for her.  Her abdominal and pelvic CTA appears to show adequate pelvic vascular anatomy to allow transfemoral insertion.   The patient was counseled at length regarding treatment alternatives for management of severe symptomatic aortic stenosis. The risks and benefits of surgical intervention has been discussed in detail. Long-term prognosis with medical therapy was discussed. Alternative approaches such as conventional surgical aortic valve replacement, transcatheter aortic valve replacement, and palliative medical therapy were compared and contrasted at length. This discussion was placed in the context of  the patient's own specific clinical presentation and past medical history. All of her questions have been addressed.    Following the decision to proceed with transcatheter aortic valve replacement, a discussion was held regarding what types of management strategies would be attempted intraoperatively in the event of life-threatening complications, including whether or not the patient would be considered a candidate for the use of cardiopulmonary bypass and/or conversion to open sternotomy for attempted surgical intervention.  I think she would be a candidate for emergent sternotomy to manage any  intraoperative complications.  The patient is aware of the fact that transient use of cardiopulmonary bypass may be necessary. The patient has been advised of a variety of complications that might develop including but not limited to risks of death, stroke, paravalvular leak, aortic dissection or other major vascular complications, aortic annulus rupture, device embolization, cardiac rupture or perforation, mitral regurgitation, acute myocardial infarction, arrhythmia, heart block or bradycardia requiring permanent pacemaker placement, congestive heart failure, respiratory failure, renal failure, pneumonia, infection, other late complications related to structural valve deterioration or migration, or other complications that might ultimately cause a temporary or permanent loss of functional independence or other long term morbidity. The patient provides full informed consent for the procedure as described and all questions were answered.       Plan:   Transfemoral TAVR using a Medtronic valve.      Gaye Pollack, MD

## 2021-08-12 NOTE — Progress Notes (Signed)
Confirmed with interpretor at PAT appt, Y Hin, that a [place] interpretor will be here tomorrow at 0700.

## 2021-08-13 ENCOUNTER — Inpatient Hospital Stay (HOSPITAL_COMMUNITY)
Admission: RE | Admit: 2021-08-13 | Discharge: 2021-08-14 | DRG: 267 | Disposition: A | Discharge status: Home / Self Care | Payer: Medicare (Managed Care) | Attending: Cardiovascular Disease | Admitting: Cardiovascular Disease

## 2021-08-13 ENCOUNTER — Other Ambulatory Visit: Payer: Self-pay | Admitting: Physician Assistant

## 2021-08-13 ENCOUNTER — Inpatient Hospital Stay (HOSPITAL_COMMUNITY): Payer: Medicare (Managed Care) | Admitting: Physician Assistant

## 2021-08-13 ENCOUNTER — Encounter (HOSPITAL_COMMUNITY)
Admission: RE | Disposition: A | Discharge status: Home / Self Care | Payer: Self-pay | Source: Home / Self Care | Attending: Cardiovascular Disease

## 2021-08-13 ENCOUNTER — Other Ambulatory Visit: Payer: Self-pay

## 2021-08-13 ENCOUNTER — Inpatient Hospital Stay (HOSPITAL_COMMUNITY): Payer: Medicare (Managed Care)

## 2021-08-13 ENCOUNTER — Encounter (HOSPITAL_COMMUNITY): Payer: Self-pay | Admitting: Cardiovascular Disease

## 2021-08-13 DIAGNOSIS — Z006 Encounter for examination for normal comparison and control in clinical research program: Secondary | ICD-10-CM

## 2021-08-13 DIAGNOSIS — Z952 Presence of prosthetic heart valve: Secondary | ICD-10-CM

## 2021-08-13 DIAGNOSIS — I129 Hypertensive chronic kidney disease with stage 1 through stage 4 chronic kidney disease, or unspecified chronic kidney disease: Secondary | ICD-10-CM | POA: Diagnosis present

## 2021-08-13 DIAGNOSIS — Z20822 Contact with and (suspected) exposure to covid-19: Secondary | ICD-10-CM | POA: Diagnosis present

## 2021-08-13 DIAGNOSIS — Z79899 Other long term (current) drug therapy: Secondary | ICD-10-CM

## 2021-08-13 DIAGNOSIS — I739 Peripheral vascular disease, unspecified: Secondary | ICD-10-CM

## 2021-08-13 DIAGNOSIS — N183 Chronic kidney disease, stage 3 unspecified: Secondary | ICD-10-CM

## 2021-08-13 DIAGNOSIS — M810 Age-related osteoporosis without current pathological fracture: Secondary | ICD-10-CM | POA: Diagnosis present

## 2021-08-13 DIAGNOSIS — Z8249 Family history of ischemic heart disease and other diseases of the circulatory system: Secondary | ICD-10-CM

## 2021-08-13 DIAGNOSIS — N184 Chronic kidney disease, stage 4 (severe): Secondary | ICD-10-CM | POA: Diagnosis present

## 2021-08-13 DIAGNOSIS — E559 Vitamin D deficiency, unspecified: Secondary | ICD-10-CM | POA: Diagnosis present

## 2021-08-13 DIAGNOSIS — I2510 Atherosclerotic heart disease of native coronary artery without angina pectoris: Secondary | ICD-10-CM | POA: Diagnosis present

## 2021-08-13 DIAGNOSIS — I70202 Unspecified atherosclerosis of native arteries of extremities, left leg: Secondary | ICD-10-CM | POA: Diagnosis present

## 2021-08-13 DIAGNOSIS — M1711 Unilateral primary osteoarthritis, right knee: Secondary | ICD-10-CM | POA: Diagnosis present

## 2021-08-13 DIAGNOSIS — Q2112 Patent foramen ovale: Secondary | ICD-10-CM

## 2021-08-13 DIAGNOSIS — I352 Nonrheumatic aortic (valve) stenosis with insufficiency: Secondary | ICD-10-CM | POA: Diagnosis present

## 2021-08-13 DIAGNOSIS — Z7982 Long term (current) use of aspirin: Secondary | ICD-10-CM

## 2021-08-13 DIAGNOSIS — I350 Nonrheumatic aortic (valve) stenosis: Secondary | ICD-10-CM

## 2021-08-13 DIAGNOSIS — I10 Essential (primary) hypertension: Secondary | ICD-10-CM

## 2021-08-13 HISTORY — PX: INTRAOPERATIVE TRANSTHORACIC ECHOCARDIOGRAM: SHX6523

## 2021-08-13 HISTORY — PX: TRANSCATHETER AORTIC VALVE REPLACEMENT, TRANSFEMORAL: SHX6400

## 2021-08-13 HISTORY — DX: Presence of prosthetic heart valve: Z95.2

## 2021-08-13 LAB — POCT I-STAT, CHEM 8
BUN: 25 mg/dL — ABNORMAL HIGH (ref 8–23)
BUN: 23 mg/dL (ref 8–23)
BUN: 23 mg/dL (ref 8–23)
Calcium, Ion: 1.16 mmol/L (ref 1.15–1.40)
Calcium, Ion: 1.28 mmol/L (ref 1.15–1.40)
Calcium, Ion: 1.27 mmol/L (ref 1.15–1.40)
Chloride: 101 mmol/L (ref 98–111)
Chloride: 100 mmol/L (ref 98–111)
Chloride: 101 mmol/L (ref 98–111)
Creatinine, Ser: 1.1 mg/dL — ABNORMAL HIGH (ref 0.44–1.00)
Creatinine, Ser: 1 mg/dL (ref 0.44–1.00)
Creatinine, Ser: 1 mg/dL (ref 0.44–1.00)
Glucose, Bld: 143 mg/dL — ABNORMAL HIGH (ref 70–99)
Glucose, Bld: 172 mg/dL — ABNORMAL HIGH (ref 70–99)
Glucose, Bld: 171 mg/dL — ABNORMAL HIGH (ref 70–99)
HCT: 29 % — ABNORMAL LOW (ref 36.0–46.0)
HCT: 28 % — ABNORMAL LOW (ref 36.0–46.0)
HCT: 27 % — ABNORMAL LOW (ref 36.0–46.0)
Hemoglobin: 9.9 g/dL — ABNORMAL LOW (ref 12.0–15.0)
Hemoglobin: 9.5 g/dL — ABNORMAL LOW (ref 12.0–15.0)
Hemoglobin: 9.2 g/dL — ABNORMAL LOW (ref 12.0–15.0)
Potassium: 3.5 mmol/L (ref 3.5–5.1)
Potassium: 3.6 mmol/L (ref 3.5–5.1)
Potassium: 3.4 mmol/L — ABNORMAL LOW (ref 3.5–5.1)
Sodium: 139 mmol/L (ref 135–145)
Sodium: 138 mmol/L (ref 135–145)
Sodium: 140 mmol/L (ref 135–145)
TCO2: 26 mmol/L (ref 22–32)
TCO2: 26 mmol/L (ref 22–32)
TCO2: 26 mmol/L (ref 22–32)

## 2021-08-13 LAB — ECHOCARDIOGRAM LIMITED
AR max vel: 1.77 cm2
AV Area VTI: 1.97 cm2
AV Area mean vel: 1.56 cm2
AV Mean grad: 28.3 mmHg
AV Peak grad: 44.6 mmHg
Ao pk vel: 3.34 m/s

## 2021-08-13 LAB — POCT ACTIVATED CLOTTING TIME
Activated Clotting Time: 196 seconds
Activated Clotting Time: 150 seconds
Activated Clotting Time: 260 seconds

## 2021-08-13 LAB — ABO/RH: ABO/RH(D): A POS

## 2021-08-13 SURGERY — IMPLANTATION, AORTIC VALVE, TRANSCATHETER, FEMORAL APPROACH
Anesthesia: Monitor Anesthesia Care

## 2021-08-13 MED ORDER — POTASSIUM CHLORIDE CRYS ER 20 MEQ PO TBCR
40.0000 meq | EXTENDED_RELEASE_TABLET | Freq: Once | ORAL | Status: AC
  Administered 2021-08-13: 40 meq via ORAL
  Filled 2021-08-13: qty 2

## 2021-08-13 MED ORDER — ONDANSETRON HCL 4 MG/2ML IJ SOLN: 4.0000 mg | Freq: Four times a day (QID) | INTRAMUSCULAR | Status: DC | PRN

## 2021-08-13 MED ORDER — NITROGLYCERIN IN D5W 200-5 MCG/ML-% IV SOLN: 0.0000 ug/min | INTRAVENOUS | Status: DC

## 2021-08-13 MED ORDER — FERROUS SULFATE 325 (65 FE) MG PO TABS
325.0000 mg | ORAL_TABLET | Freq: Every day | ORAL | Status: DC
  Administered 2021-08-13 – 2021-08-14 (×2): 325 mg via ORAL
  Filled 2021-08-13 (×2): qty 1

## 2021-08-13 MED ORDER — ASPIRIN EC 81 MG PO TBEC
81.0000 mg | DELAYED_RELEASE_TABLET | Freq: Every day | ORAL | Status: DC
  Administered 2021-08-13 – 2021-08-14 (×2): 81 mg via ORAL
  Filled 2021-08-13 (×2): qty 1

## 2021-08-13 MED ORDER — HEPARIN SODIUM (PORCINE) 1000 UNIT/ML IJ SOLN
INTRAMUSCULAR | Status: DC | PRN
  Administered 2021-08-13: 8000 [IU] via INTRAVENOUS

## 2021-08-13 MED ORDER — HEPARIN SODIUM (PORCINE) 1000 UNIT/ML IJ SOLN
INTRAMUSCULAR | Status: AC
  Filled 2021-08-13: qty 1

## 2021-08-13 MED ORDER — PROTAMINE SULFATE 10 MG/ML IV SOLN
INTRAVENOUS | Status: DC | PRN
  Administered 2021-08-13: 60 mg via INTRAVENOUS
  Administered 2021-08-13: 20 mg via INTRAVENOUS

## 2021-08-13 MED ORDER — SENNOSIDES-DOCUSATE SODIUM 8.6-50 MG PO TABS: 2.0000 | ORAL_TABLET | Freq: Every day | ORAL | Status: DC

## 2021-08-13 MED ORDER — CHLORHEXIDINE GLUCONATE 4 % EX LIQD
30.0000 mL | CUTANEOUS | Status: DC
  Filled 2021-08-13: qty 30

## 2021-08-13 MED ORDER — SODIUM CHLORIDE 0.9 % IV SOLN: 250.0000 mL | INTRAVENOUS | Status: DC | PRN

## 2021-08-13 MED ORDER — LACTATED RINGERS IV SOLN: INTRAVENOUS | Status: DC | PRN

## 2021-08-13 MED ORDER — CHLORHEXIDINE GLUCONATE 0.12 % MT SOLN
15.0000 mL | Freq: Once | OROMUCOSAL | Status: AC
  Filled 2021-08-13: qty 15

## 2021-08-13 MED ORDER — SODIUM CHLORIDE 0.9 % IV SOLN: INTRAVENOUS | Status: AC

## 2021-08-13 MED ORDER — CHLORHEXIDINE GLUCONATE 4 % EX LIQD
60.0000 mL | Freq: Once | CUTANEOUS | Status: DC
  Filled 2021-08-13: qty 60

## 2021-08-13 MED ORDER — MORPHINE SULFATE (PF) 2 MG/ML IV SOLN
1.0000 mg | INTRAVENOUS | Status: DC | PRN
Start: 2021-08-13 — End: 2021-08-14

## 2021-08-13 MED ORDER — PRAVASTATIN SODIUM 40 MG PO TABS
80.0000 mg | ORAL_TABLET | Freq: Every day | ORAL | Status: DC
  Administered 2021-08-13 – 2021-08-14 (×2): 80 mg via ORAL
  Filled 2021-08-13 (×2): qty 2

## 2021-08-13 MED ORDER — SODIUM CHLORIDE 0.9 % IV SOLN: INTRAVENOUS | Status: DC

## 2021-08-13 MED ORDER — OXYCODONE HCL 5 MG PO TABS: 5.0000 mg | ORAL_TABLET | ORAL | Status: DC | PRN

## 2021-08-13 MED ORDER — HEPARIN (PORCINE) IN NACL 1000-0.9 UT/500ML-% IV SOLN
INTRAVENOUS | Status: AC
  Filled 2021-08-13: qty 500

## 2021-08-13 MED ORDER — LIDOCAINE HCL (PF) 1 % IJ SOLN
INTRAMUSCULAR | Status: DC | PRN
  Administered 2021-08-13 (×2): 12 mL

## 2021-08-13 MED ORDER — PREDNISOLONE ACETATE 1 % OP SUSP
1.0000 [drp] | Freq: Four times a day (QID) | OPHTHALMIC | Status: DC
  Administered 2021-08-13 – 2021-08-14 (×3): 1 [drp] via OPHTHALMIC
  Filled 2021-08-13: qty 5

## 2021-08-13 MED ORDER — SODIUM CHLORIDE 0.9% FLUSH
3.0000 mL | Freq: Two times a day (BID) | INTRAVENOUS | Status: DC
  Administered 2021-08-13 – 2021-08-14 (×2): 3 mL via INTRAVENOUS

## 2021-08-13 MED ORDER — IOHEXOL 350 MG/ML SOLN
INTRAVENOUS | Status: DC | PRN
  Administered 2021-08-13: 50 mL

## 2021-08-13 MED ORDER — SODIUM CHLORIDE 0.9% FLUSH: 3.0000 mL | INTRAVENOUS | Status: DC | PRN

## 2021-08-13 MED ORDER — HEPARIN (PORCINE) IN NACL 1000-0.9 UT/500ML-% IV SOLN
INTRAVENOUS | Status: DC | PRN
  Administered 2021-08-13 (×3): 500 mL

## 2021-08-13 MED ORDER — ACETAMINOPHEN 500 MG PO TABS: 1000.0000 mg | ORAL_TABLET | Freq: Once | ORAL | Status: DC

## 2021-08-13 MED ORDER — CEFAZOLIN SODIUM-DEXTROSE 2-4 GM/100ML-% IV SOLN
2.0000 g | Freq: Three times a day (TID) | INTRAVENOUS | Status: AC
  Administered 2021-08-13 – 2021-08-14 (×2): 2 g via INTRAVENOUS
  Filled 2021-08-13 (×2): qty 100

## 2021-08-13 MED ORDER — CHLORHEXIDINE GLUCONATE 0.12 % MT SOLN
OROMUCOSAL | Status: AC
  Administered 2021-08-13: 15 mL via OROMUCOSAL
  Filled 2021-08-13: qty 15

## 2021-08-13 MED ORDER — SODIUM CHLORIDE 0.9 % IV SOLN: INTRAVENOUS | Status: DC | PRN

## 2021-08-13 MED ORDER — ONDANSETRON HCL 4 MG/2ML IJ SOLN
INTRAMUSCULAR | Status: DC | PRN
  Administered 2021-08-13: 4 mg via INTRAVENOUS

## 2021-08-13 MED ORDER — LIDOCAINE HCL (PF) 1 % IJ SOLN
INTRAMUSCULAR | Status: AC
  Filled 2021-08-13: qty 30

## 2021-08-13 MED ORDER — AMLODIPINE BESYLATE 10 MG PO TABS
10.0000 mg | ORAL_TABLET | Freq: Every day | ORAL | Status: DC
  Administered 2021-08-14: 10 mg via ORAL
  Filled 2021-08-13: qty 1

## 2021-08-13 MED ORDER — PROPOFOL 10 MG/ML IV BOLUS
INTRAVENOUS | Status: DC | PRN
  Administered 2021-08-13: 10 ug/kg/min via INTRAVENOUS

## 2021-08-13 MED ORDER — TRAMADOL HCL 50 MG PO TABS: 50.0000 mg | ORAL_TABLET | ORAL | Status: DC | PRN

## 2021-08-13 MED ORDER — NITROGLYCERIN IN D5W 200-5 MCG/ML-% IV SOLN
INTRAVENOUS | Status: AC
  Filled 2021-08-13: qty 250

## 2021-08-13 SURGICAL SUPPLY — 35 items
BAG SNAP BAND KOVER 36X36 (MISCELLANEOUS) ×6 IMPLANT
BALLN TRUE 18X4.5 (BALLOONS) ×2
BALLOON TRUE 18X4.5 (BALLOONS) IMPLANT
BLANKET WARM UNDERBOD FULL ACC (MISCELLANEOUS) ×2 IMPLANT
CABLE ADAPT PACING TEMP 12FT (ADAPTER) ×2 IMPLANT
CATH DIAG 6FR PIGTAIL (CATHETERS) ×1 IMPLANT
CATH DIAG 6FR PIGTAIL ANGLED (CATHETERS) ×1 IMPLANT
CATH INFINITI 6F AL2 (CATHETERS) ×1 IMPLANT
CATH S G BIP PACING (CATHETERS) ×1 IMPLANT
CLOSURE MYNX CONTROL 6F/7F (Vascular Products) ×1 IMPLANT
CLOSURE PERCLOSE PROSTYLE (VASCULAR PRODUCTS) ×2 IMPLANT
DRYSEAL FLEXSHEATH 14FR 33CM (SHEATH) ×1
ELECT DEFIB PAD ADLT CADENCE (PAD) ×1 IMPLANT
GUIDEWIRE CNFDA BRKR CVD (WIRE) ×1 IMPLANT
GUIDEWIRE INQWIRE 1.5J.035X260 (WIRE) IMPLANT
INQWIRE 1.5J .035X260CM (WIRE) ×2
KIT HEART LEFT (KITS) ×2 IMPLANT
KIT MICROPUNCTURE NIT STIFF (SHEATH) ×1 IMPLANT
PACK CARDIAC CATHETERIZATION (CUSTOM PROCEDURE TRAY) ×2 IMPLANT
SHEATH BRITE TIP 7FR 35CM (SHEATH) ×1 IMPLANT
SHEATH DRYSEAL FLEX 14FR 33CM (SHEATH) IMPLANT
SHEATH FAST CATH 14F (SHEATH) ×1 IMPLANT
SHEATH PINNACLE 6F 10CM (SHEATH) ×1 IMPLANT
SHEATH PINNACLE 8F 10CM (SHEATH) ×1 IMPLANT
STOPCOCK MORSE 400PSI 3WAY (MISCELLANEOUS) ×4 IMPLANT
SYS EVOLUT FX DELIVERY 23-29 (CATHETERS) ×2
SYS EVOLUT FX LOADING 23-29 (CATHETERS) ×2
SYSTEM EVOLUT FX DELIVRY 23-29 (CATHETERS) IMPLANT
SYSTEM EVOLUT FX LOADING 23-29 (CATHETERS) IMPLANT
TRANSDUCER W/STOPCOCK (MISCELLANEOUS) ×4 IMPLANT
TUBING CONTRAST HIGH PRESS 48 (TUBING) ×1 IMPLANT
VALVE EVOLUT FX 26 (Valve) ×1 IMPLANT
WIRE AMPLATZ SS-J .035X180CM (WIRE) ×1 IMPLANT
WIRE EMERALD 3MM-J .035X150CM (WIRE) ×1 IMPLANT
WIRE EMERALD ST .035X260CM (WIRE) ×1 IMPLANT

## 2021-08-13 NOTE — CV Procedure (Addendum)
[facility]  TAVR OPERATIVE NOTE   Date of Procedure:  08/13/2021  Preoperative Diagnosis: Severe Aortic Stenosis   Postoperative Diagnosis: Same   Procedure:   Transcatheter Aortic Valve Replacement - Transfemoral Approach  Medtronic Evolut Pro THV (size 26 mm, model # [id number], serial # [id number])   Co-Surgeons:  Lauree Chandler, MD and Gaye Pollack, MD  Anesthesiologist:  Suzette Battiest  Echocardiographer:  Gasper Sells  Pre-operative Echo Findings: Severe aortic stenosis Normal left ventricular systolic function  Post-operative Echo Findings: Trivial paravalvular leak Normal left ventricular systolic function  BRIEF CLINICAL NOTE AND INDICATIONS FOR SURGERY  80 yo [place] female with history of HTN, stage 3 chronic kidney disease, PAD and severe aortic stenosis who is here today for TAVR. She has been followed by Dr. Acie Fredrickson for moderate aortic stenosis since 2021. Echo in 2021 with aortic valve mean gradient of 36 mmHg. She was seen by Dr. Acie Fredrickson in May 2022 and reported a syncopal episode as well as worsened dyspnea on exertion. Echo 01/03/21 with LVEF=60-65%. The aortic valve is calcified and thickened. There is severe aortic stenosis. Mean gradient 54 mmHg, peak graadient 90.6 mmHg, AVA 0.56 cm2, dimensionless index 0.22. She has had progressive dyspnea on exertion. Cardiac cath with non-obstructive CAD. CT scans with favorable anatomy for placement of Medtronic Evolut Pro THV from transfemoral approach.   During the course of the patient's preoperative work up they have been evaluated comprehensively by a multidisciplinary team of specialists coordinated through the [facility] in the [facility].  They have been demonstrated to suffer from symptomatic severe aortic stenosis as noted above. The patient has been counseled extensively as to the relative risks and benefits of all options for the  treatment of severe aortic stenosis including long term medical therapy, conventional surgery for aortic valve replacement, and transcatheter aortic valve replacement.  The patient has been independently evaluated by Dr. Cyndia Bent with CT surgery and they are felt to be at high risk for conventional surgical aortic valve replacement. The surgeon indicated the patient would be a poor candidate for conventional surgery. Based upon review of all of the patient's preoperative diagnostic tests they are felt to be candidate for transcatheter aortic valve replacement using the transfemoral approach as an alternative to high risk conventional surgery.    Following the decision to proceed with transcatheter aortic valve replacement, a discussion has been held regarding what types of management strategies would be attempted intraoperatively in the event of life-threatening complications, including whether or not the patient would be considered a candidate for the use of cardiopulmonary bypass and/or conversion to open sternotomy for attempted surgical intervention.  The patient has been advised of a variety of complications that might develop peculiar to this approach including but not limited to risks of death, stroke, paravalvular leak, aortic dissection or other major vascular complications, aortic annulus rupture, device embolization, cardiac rupture or perforation, acute myocardial infarction, arrhythmia, heart block or bradycardia requiring permanent pacemaker placement, congestive heart failure, respiratory failure, renal failure, pneumonia, infection, other late complications related to structural valve deterioration or migration, or other complications that might ultimately cause a temporary or permanent loss of functional independence or other long term morbidity.  The patient provides full informed consent for the procedure as described and all questions were answered preoperatively.    DETAILS OF THE OPERATIVE  PROCEDURE  PREPARATION:   The patient is brought to the operating room on the above mentioned date and  central monitoring was established by the anesthesia team including placement of a radial arterial line. The patient is placed in the supine position on the operating table.  Intravenous antibiotics are administered. Conscious sedation is used.   Baseline transthoracic echocardiogram was performed. The patient's chest, abdomen, both groins, and both lower extremities are prepared and draped in a sterile manner. A time out procedure is performed.   PERIPHERAL ACCESS:   Using the modified Seldinger technique, femoral arterial and venous access were obtained with placement of a 6 Fr sheath in the artery and a 7 Fr sheath in the vein on the left side using u/s guidance.  A pigtail diagnostic catheter was passed through the femoral arterial sheath under fluoroscopic guidance into the aortic root.  A temporary transvenous pacemaker catheter was passed through the femoral venous sheath under fluoroscopic guidance into the right ventricle.  The pacemaker was tested to ensure stable lead placement and pacemaker capture. Aortic root angiography was performed in order to determine the optimal angiographic angle for valve deployment.  TRANSFEMORAL ACCESS:  A micropuncture kit was used to gain access to the right femoral artery using u/s guidance. Position confirmed with angiography. Pre-closure with double ProGlide closure devices. The patient was heparinized systemically and ACT verified > 250 seconds.    A 14 Fr transfemoral Dry Seal sheath was introduced into the right femoral artery over an Amplatz superstiff wire. An AL-2 catheter was used to direct a straight-tip exchange length wire across the native aortic valve into the left ventricle. This was exchanged out for a pigtail catheter and position was confirmed in the LV apex. Simultaneous LV and Ao pressures were recorded.  The pigtail catheter was then  exchanged for an Confida wire in the LV apex. A 18 mm True balloon was then used for balloon valvuloplasty while using controlled pacing.   TRANSCATHETER HEART VALVE DEPLOYMENT:  A Medtronic Evolut Pro THV size 26 mm was prepared and per manufacturer's guidelines, and the proper orientation of the valve is confirmed on the delivery system. The Dry Seal sheath was removed and the the valve delivery system was advanced into the descending aorta. The valve was then advanced across the aortic arch. The valve was carefully positioned across the aortic valve annulus. Once final position of the valve has been confirmed with angiographic assessment in the cusp overlap view and in the LAO view, the valve is deployed with controlled rapid pacing. The valve is taken to 80% deployment and appropriate depth is confirmed. The valve is then released from each paddle. There is no valvular leak and no central aortic insufficiency.  The patient's hemodynamic recovery following valve deployment is good.  Echo demostrated acceptable post-procedural gradients, stable mitral valve function, and trivial AI.   PROCEDURE COMPLETION:  The sheath was then removed and closure devices were completed. Protamine was administered once femoral arterial repair was complete. The temporary pacemaker, pigtail catheters and femoral sheaths were removed with a Mynx closure device placed in the artery and manual pressure used for venous hemostasis.    The patient tolerated the procedure well and is transported to the surgical intensive care in stable condition. There were no immediate intraoperative complications. All sponge instrument and needle counts are verified correct at completion of the operation.   No blood products were administered during the operation.  The patient received a total of 50 mL of intravenous contrast during the procedure.  Lauree Chandler MD 08/13/2021 11:51 AM

## 2021-08-13 NOTE — Interval H&P Note (Signed)
History and Physical Interval Note:  08/13/2021 9:40 AM  Krystal Maddox  has presented today for surgery, with the diagnosis of Severe Aortic Stenosis.  The various methods of treatment have been discussed with the patient and family. After consideration of risks, benefits and other options for treatment, the patient has consented to  Procedure(s): TRANSCATHETER AORTIC VALVE REPLACEMENT, TRANSFEMORAL (N/A) INTRAOPERATIVE TRANSTHORACIC ECHOCARDIOGRAM (N/A) as a surgical intervention.  The patient's history has been reviewed, patient examined, no change in status, stable for surgery.  I have reviewed the patient's chart and labs.  Questions were answered to the patient's satisfaction.     Gaye Pollack

## 2021-08-13 NOTE — Anesthesia Preprocedure Evaluation (Signed)
Anesthesia Evaluation  Patient identified by MRN, date of birth, ID band Patient awake    Reviewed: Allergy & Precautions, NPO status , Patient's Chart, lab work & pertinent test results  Airway Mallampati: II  TM Distance: >3 FB Neck ROM: Full    Dental  (+) Dental Advisory Given   Pulmonary neg pulmonary ROS,    breath sounds clear to auscultation       Cardiovascular hypertension, Pt. on medications + Peripheral Vascular Disease  + Valvular Problems/Murmurs AS  Rhythm:Regular Rate:Normal     Neuro/Psych negative neurological ROS     GI/Hepatic negative GI ROS, Neg liver ROS,   Endo/Other  negative endocrine ROS  Renal/GU Renal InsufficiencyRenal disease     Musculoskeletal  (+) Arthritis ,   Abdominal   Peds  Hematology  (+) anemia ,   Anesthesia Other Findings   Reproductive/Obstetrics                             Lab Results  Component Value Date   WBC 12.7 (H) 08/12/2021   HGB 9.4 (L) 08/12/2021   HCT 31.2 (L) 08/12/2021   MCV 84.8 08/12/2021   PLT 409 (H) 08/12/2021   Lab Results  Component Value Date   CREATININE 1.18 (H) 08/12/2021   BUN 21 08/12/2021   NA 130 (L) 08/12/2021   K 3.3 (L) 08/12/2021   CL 96 (L) 08/12/2021   CO2 22 08/12/2021    Anesthesia Physical Anesthesia Plan  ASA: 4  Anesthesia Plan: MAC   Post-op Pain Management: Minimal or no pain anticipated and Tylenol PO (pre-op)   Induction:   PONV Risk Score and Plan: 2 and Ondansetron, Treatment may vary due to age or medical condition and Propofol infusion  Airway Management Planned: Natural Airway and Simple Face Mask  Additional Equipment: Arterial line  Intra-op Plan:   Post-operative Plan:   Informed Consent: I have reviewed the patients History and Physical, chart, labs and discussed the procedure including the risks, benefits and alternatives for the proposed anesthesia with the  patient or authorized representative who has indicated his/her understanding and acceptance.       Plan Discussed with:   Anesthesia Plan Comments:         Anesthesia Quick Evaluation

## 2021-08-13 NOTE — Progress Notes (Signed)
Mobility Specialist: Progress Note   08/13/21 1719  Mobility  Activity Ambulated in hall  Level of Assistance Minimal assist, patient does 75% or more  Assistive Device Front wheel walker  Distance Ambulated (ft) 240 ft  Mobility Ambulated with assistance in hallway  Mobility Response Tolerated well  Mobility performed by Mobility specialist  $Mobility charge 1 Mobility   Pre-Mobility: 69 HR, 97% SpO2 Post-Mobility: 69 HR  Pt c/o bilateral groin pain during ambulation, no rating given. Pt back to bed after walk with call bell and phone at her side. Groin sites dry.   [facility] Tereasa Yilmaz Mobility Specialist Mobility Specialist Phone #1: [phone] Mobility Specialist Phone #2: [phone]

## 2021-08-13 NOTE — Progress Notes (Signed)
  [facility] VALVE TEAM  Patient doing well s/p TAVR. She is hemodynamically stable. Groin sites stable. ECG with SB with HR's in the upper 50's and 1st degree AV block but no high grade block. Plan to DC arterial line and transfer to 4E. Plan for early ambulation after bedrest completed and hopeful discharge over the next 24-48 hours.   Kathyrn Drown NP-C Structural Heart Team  Pager: [phone]

## 2021-08-13 NOTE — Progress Notes (Signed)
Patient to room 4E04 from cath lab. Vital signs obtained. On monitor CCMD notified. Bilateral groins level 0.. Alert and oriented to room and call light. Call bell within reach. Will continue to monitor.  Interpreted used to communicate.  Era Bumpers, RN

## 2021-08-13 NOTE — Anesthesia Postprocedure Evaluation (Signed)
Anesthesia Post Note  Patient: Krystal Maddox  Procedure(s) Performed: TRANSCATHETER AORTIC VALVE REPLACEMENT, TRANSFEMORAL INTRAOPERATIVE TRANSTHORACIC ECHOCARDIOGRAM     Patient location during evaluation: PACU Anesthesia Type: MAC Level of consciousness: awake and alert Pain management: pain level controlled Vital Signs Assessment: post-procedure vital signs reviewed and stable Respiratory status: spontaneous breathing, nonlabored ventilation, respiratory function stable and patient connected to nasal cannula oxygen Cardiovascular status: stable and blood pressure returned to baseline Postop Assessment: no apparent nausea or vomiting Anesthetic complications: no   No notable events documented.  Last Vitals:  Vitals:   08/13/21 1430 08/13/21 1500  BP: (!) 144/48 (!) 144/47  Pulse: (!) 57 65  Resp: 16 17  Temp:  36.6 C  SpO2: 98% 97%    Last Pain:  Vitals:   08/13/21 1500  TempSrc: Oral  PainSc:                  Tiajuana Amass

## 2021-08-13 NOTE — Transfer of Care (Signed)
Immediate Anesthesia Transfer of Care Note  Patient: Krystal Maddox  Procedure(s) Performed: TRANSCATHETER AORTIC VALVE REPLACEMENT, TRANSFEMORAL INTRAOPERATIVE TRANSTHORACIC ECHOCARDIOGRAM  Patient Location: Cath Lab  Anesthesia Type:MAC  Level of Consciousness: drowsy and patient cooperative  Airway & Oxygen Therapy: Patient Spontanous Breathing and Patient connected to nasal cannula oxygen  Post-op Assessment: Report given to RN and Post -op Vital signs reviewed and stable  Post vital signs: Reviewed and stable  Last Vitals:  Vitals Value Taken Time  BP 127/44 (aline)   Temp    Pulse 49   Resp 12   SpO2 99     Last Pain:  Vitals:   08/13/21 0743  TempSrc:   PainSc: 0-No pain         Complications: No notable events documented.

## 2021-08-13 NOTE — Interval H&P Note (Signed)
History and Physical Interval Note:  08/13/2021 8:24 AM  Krystal Maddox  has presented today for surgery, with the diagnosis of Severe Aortic Stenosis.  The various methods of treatment have been discussed with the patient and family. After consideration of risks, benefits and other options for treatment, the patient has consented to  Procedure(s): TRANSCATHETER AORTIC VALVE REPLACEMENT, TRANSFEMORAL (N/A) INTRAOPERATIVE TRANSTHORACIC ECHOCARDIOGRAM (N/A) as a surgical intervention.  The patient's history has been reviewed, patient examined, no change in status, stable for surgery.  I have reviewed the patient's chart and labs.  Questions were answered to the patient's satisfaction.     Lauree Chandler

## 2021-08-13 NOTE — Progress Notes (Addendum)
  Echocardiogram 2D Echocardiogram limited TAVR has been performed.  Krystal Maddox M 08/13/2021, 11:12 AM

## 2021-08-13 NOTE — Op Note (Signed)
[facility]   MULTIDISCIPLINARY HEART VALVE TEAM     TAVR OPERATIVE NOTE    Krystal Maddox [id number]  Date of Procedure:                 08/13/2021   Preoperative Diagnosis:      Severe Aortic Stenosis    Postoperative Diagnosis:    Same    Procedure:        Transcatheter Aortic Valve Replacement - Percutaneous Right Transfemoral Approach             Medtronic Evolut FX  (size 26 mm, model # EVOLUTFX-26, serial # [id number])              Co-Surgeons:            Gaye Pollack, MD and Lauree Chandler, MD     Anesthesiologist:                  Deatra Canter , MD   Echocardiographer:              Osborne Oman, MD   Pre-operative Echo Findings: Severe aortic stenosis and moderate AI  Normal left ventricular systolic function   Post-operative Echo Findings: Trivial paravalvular leak Normal left ventricular systolic function      BRIEF CLINICAL NOTE AND INDICATIONS FOR SURGERY    This 80 year old woman has stage D, severe, symptomatic aortic stenosis with New York Heart Association class II symptoms of exertional fatigue and shortness of breath consistent with chronic diastolic congestive heart failure.  I have personally reviewed her 2D echocardiogram, cardiac catheterization, and CTA studies.  Her echocardiogram from April 2022 showed a severely calcified aortic valve with thickened leaflets and restricted mobility.  The mean gradient was 54 mmHg with a valve area 0.56 cm consistent with severe aortic stenosis.  Left ventricular ejection fraction was normal.  Cardiac catheterization showed moderate stenosis of the RCA that did not appear to be flow-limiting with mild calcified proximal LAD stenosis.  The mean gradient was 46 mmHg consistent with severe aortic stenosis.  I agree that aortic valve replacement is indicated in this patient for relief of her symptoms and to prevent left ventricular deterioration.  Given her age I think that transcatheter aortic  valve replacement would be the best option for her.  Her gated cardiac CTA shows a relatively small aortic annulus that appears suitable for either a 20 mm Sapien 3 valve or a 23 mm Medtronic Evolut Pro + valve.  I think the Medtronic valve is probably the best option for her.  Her abdominal and pelvic CTA appears to show adequate pelvic vascular anatomy to allow transfemoral insertion.   The patient was counseled at length regarding treatment alternatives for management of severe symptomatic aortic stenosis. The risks and benefits of surgical intervention has been discussed in detail. Long-term prognosis with medical therapy was discussed. Alternative approaches such as conventional surgical aortic valve replacement, transcatheter aortic valve replacement, and palliative medical therapy were compared and contrasted at length. This discussion was placed in the context of the patient's own specific clinical presentation and past medical history. All of her questions have been addressed.    Following the decision to proceed with transcatheter aortic valve replacement, a discussion was held regarding what types of management strategies would be attempted intraoperatively in the event of life-threatening complications, including whether or not the patient would be considered a candidate for the use of cardiopulmonary bypass and/or conversion to open sternotomy  for attempted surgical intervention.  I think she would be a candidate for emergent sternotomy to manage any intraoperative complications.  The patient is aware of the fact that transient use of cardiopulmonary bypass may be necessary. The patient has been advised of a variety of complications that might develop including but not limited to risks of death, stroke, paravalvular leak, aortic dissection or other major vascular complications, aortic annulus rupture, device embolization, cardiac rupture or perforation, mitral regurgitation, acute myocardial  infarction, arrhythmia, heart block or bradycardia requiring permanent pacemaker placement, congestive heart failure, respiratory failure, renal failure, pneumonia, infection, other late complications related to structural valve deterioration or migration, or other complications that might ultimately cause a temporary or permanent loss of functional independence or other long term morbidity. The patient provides full informed consent for the procedure as described and all questions were answered.           DETAILS OF THE OPERATIVE PROCEDURE   PREPARATION:     The patient is brought to the operating room on the above mentioned date and central monitoring was established by the anesthesia team including placement of a radial arterial line. The patient is placed in the supine position on the operating table.  Intravenous antibiotics are administered. The patient was placed under conscious sedation by anesthesia.   Baseline transthoracic echocardiogram was performed. The abdomen and both groins are prepared and draped in a sterile manner. A time out procedure is performed.     PERIPHERAL ACCESS:     Using the modified Seldinger technique, femoral arterial and venous access was obtained with placement of 6 Fr sheaths on the left side.  A pigtail diagnostic catheter was passed through the left arterial sheath under fluoroscopic guidance into the aortic root.  A temporary transvenous pacemaker catheter was passed through the left femoral venous sheath under fluoroscopic guidance into the right ventricle.  The pacemaker was tested to ensure stable lead placement and pacemaker capture.      TRANSFEMORAL ACCESS:    Percutaneous transfemoral access and sheath placement was performed using ultrasound guidance and micropuncture technique.  The right common femoral artery was cannulated using a micropuncture needle.  A pair of Abbott Perclose percutaneous closure devices were placed and a 6 French sheath  replaced into the femoral artery.  The patient was heparinized systemically and ACT verified > 250 seconds.     A 14 Fr transfemoral Gore Dry-Seal sheath was introduced into the right femoral artery over an Amplatz superstiff wire. An AL-1 catheter was used to direct a straight-tip exchange length wire across the native aortic valve into the left ventricle. This was exchanged out for a pigtail catheter and position was confirmed in the LV apex. Simultaneous LV and Ao pressures were recorded.  The pigtail catheter was exchanged for a Confida wire in the LV apex.       BALLOON AORTIC VALVULOPLASTY:    An 18 mm Bard True balloon was used under rapid ventricular pacing. This was well-tolerated.   TRANSCATHETER HEART VALVE DEPLOYMENT:    A Medtronic Evolut FX transcatheter heart valve (size 26 mm) was prepared and loaded into the delivery catheter system per manufacturer's guidelines and the proper orientation of the valve is confirmed under fluoroscopy. The 54F shealth was removed and the delivery system and inline sheath were inserted into the right common femoral artery over the Confida wire and the inline sheath advanced into the abdominal aorta under fluoroscopic guidance. The delivery catheter was advanced around the aortic arch  and the valve was carefully positioned across the aortic valve annulus in the cusp overlap postion. An aortic root injection was performed to confirm position and the patient was turned into the LAO position. The depth looked good and the valve deployed using the normal technique under fluoroscopic guidance. Intermittent pacing was used during valve deployment. The delivery system and guidewire were retracted into the descending aorta and the nosecone re-sheathed. Valve function is assessed using echocardiography. There is felt to be trivial paravalvular leak and no central aortic insufficiency. The patient's hemodynamic recovery following valve deployment is good.         PROCEDURE COMPLETION:    The delivery system and in-line sheath were removed and femoral artery closure performed using the Perclose devices.  Protamine was administered once femoral arterial repair was complete. The temporary pacemaker, pigtail catheter and femoral sheaths were removed with manual pressure used for hemostasis. A Mynx closure device was used for the left femoral artery.   The patient tolerated the procedure well and is transported to the cath lab recovery in stable condition. There were no immediate intraoperative complications. All sponge instrument and needle counts are verified correct at completion of the operation.    No blood products were administered during the operation.   The patient received a total of 50 mL of intravenous contrast during the procedure.     Gaye Pollack, MD

## 2021-08-13 NOTE — Anesthesia Procedure Notes (Signed)
Arterial Line Insertion Start/End12/02/2021 9:00 AM Performed by: Janene Harvey, CRNA  Patient location: Pre-op. Preanesthetic checklist: patient identified, risks and benefits discussed and surgical consent Lidocaine 1% used for infiltration Left, radial was placed Catheter size: 20 G Hand hygiene performed  and maximum sterile barriers used  Allen's test indicative of satisfactory collateral circulation Attempts: 2 Procedure performed without using ultrasound guided technique. Following insertion, dressing applied and Biopatch. Post procedure assessment: unchanged and normal  Patient tolerated the procedure well with no immediate complications. Additional procedure comments: Attempt x1 by SRNA, successful placement by CRNA.

## 2021-08-13 NOTE — Discharge Instructions (Signed)

## 2021-08-13 NOTE — Anesthesia Procedure Notes (Signed)
Procedure Name: MAC Date/Time: 08/13/2021 9:32 AM Performed by: Janene Harvey, CRNA Pre-anesthesia Checklist: Patient identified, Emergency Drugs available, Suction available and Patient being monitored Patient Re-evaluated:Patient Re-evaluated prior to induction Oxygen Delivery Method: Simple face mask Induction Type: IV induction Placement Confirmation: positive ETCO2 Dental Injury: Teeth and Oropharynx as per pre-operative assessment

## 2021-08-14 ENCOUNTER — Inpatient Hospital Stay (HOSPITAL_COMMUNITY): Payer: Medicare (Managed Care)

## 2021-08-14 ENCOUNTER — Encounter (HOSPITAL_COMMUNITY): Payer: Self-pay | Admitting: Cardiovascular Disease

## 2021-08-14 DIAGNOSIS — Z952 Presence of prosthetic heart valve: Secondary | ICD-10-CM | POA: Diagnosis not present

## 2021-08-14 DIAGNOSIS — I350 Nonrheumatic aortic (valve) stenosis: Secondary | ICD-10-CM

## 2021-08-14 LAB — ECHOCARDIOGRAM COMPLETE
AR max vel: 2.34 cm2
AV Area VTI: 2.46 cm2
AV Area mean vel: 2.48 cm2
AV Mean grad: 7 mmHg
AV Peak grad: 12.6 mmHg
Ao pk vel: 1.78 m/s
Area-P 1/2: 3.5 cm2
Height: 59 in
S' Lateral: 2.4 cm
Weight: 2182.4 oz

## 2021-08-14 LAB — CBC
HCT: 28.8 % — ABNORMAL LOW (ref 36.0–46.0)
Hemoglobin: 8.7 g/dL — ABNORMAL LOW (ref 12.0–15.0)
MCH: 25.7 pg — ABNORMAL LOW (ref 26.0–34.0)
MCHC: 30.2 g/dL (ref 30.0–36.0)
MCV: 85 fL (ref 80.0–100.0)
Platelets: 341 10*3/uL (ref 150–400)
RBC: 3.39 MIL/uL — ABNORMAL LOW (ref 3.87–5.11)
RDW: 14.5 % (ref 11.5–15.5)
WBC: 13.8 10*3/uL — ABNORMAL HIGH (ref 4.0–10.5)
nRBC: 0 % (ref 0.0–0.2)

## 2021-08-14 LAB — MAGNESIUM: Magnesium: 1.7 mg/dL (ref 1.7–2.4)

## 2021-08-14 LAB — BASIC METABOLIC PANEL
Anion gap: 11 (ref 5–15)
BUN: 25 mg/dL — ABNORMAL HIGH (ref 8–23)
CO2: 24 mmol/L (ref 22–32)
Calcium: 8.6 mg/dL — ABNORMAL LOW (ref 8.9–10.3)
Chloride: 101 mmol/L (ref 98–111)
Creatinine, Ser: 1.22 mg/dL — ABNORMAL HIGH (ref 0.44–1.00)
GFR, Estimated: 45 mL/min — ABNORMAL LOW (ref >60)
Glucose, Bld: 169 mg/dL — ABNORMAL HIGH (ref 70–99)
Potassium: 4.7 mmol/L (ref 3.5–5.1)
Sodium: 136 mmol/L (ref 135–145)

## 2021-08-14 NOTE — Discharge Summary (Addendum)
[place] VALVE TEAM  Discharge Summary    Patient ID: Krystal Maddox MRN: [id number]; DOB: April 07, 1941  Admit date: 08/13/2021 Discharge date: 08/14/2021  Primary Care Provider: Prince Solian, MD  Primary Cardiologist: Mertie Moores, MD / Dr. Angelena Form, MD & Dr. Cyndia Bent, MD (TAVR)  Discharge Diagnoses    Principal Problem:   S/P TAVR (transcatheter aortic valve replacement) Active Problems:   Severe aortic stenosis   Peripheral arterial disease ([place])   Hypertension   CKD (chronic kidney disease), stage III (HCC)  Allergies Allergies  Allergen Reactions   Tylenol [Acetaminophen]     Pt reports long time ago she got sick on tylenol she not sure what happen but takes Tylenol now.   Diagnostic Studies/Procedures      TAVR OPERATIVE NOTE    Krystal Maddox [id number]   Date of Procedure:                 08/13/2021   Preoperative Diagnosis:      Severe Aortic Stenosis    Postoperative Diagnosis:    Same    Procedure:        Transcatheter Aortic Valve Replacement - Percutaneous Right Transfemoral Approach             Medtronic Evolut FX  (size 26 mm, model # EVOLUTFX-26, serial # [id number])              Co-Surgeons:            Gaye Pollack, MD and Lauree Chandler, MD     Anesthesiologist:                  Deatra Canter , MD   Echocardiographer:              Osborne Oman, MD   Pre-operative Echo Findings: Severe aortic stenosis and moderate AI  Normal left ventricular systolic function   Post-operative Echo Findings: Trivial paravalvular leak Normal left ventricular systolic function   _____________   Echo 08/14/21: Completed but pending formal read at the time of discharge   History of Present Illness     Krystal Maddox is a 80 y.o. female with a history of HTN, CKD stage III, PAD and severe aortic stenosis who presented to [facility] on 08/13/21 for planned TAVR.   Ms. Dobosz was referred to Dr. Angelena Form for further  evaluation of her aortic stenosis and possible TAVR on 05/10/21. She has been followed by Dr. Acie Fredrickson for moderate aortic stenosis since 2021. Echocardiogram at that time showed an aortic valve mean gradient of 36 mmHg. She was seen by Dr. Acie Fredrickson in 01/2021 and reported a syncopal episode as well as worsened dyspnea on exertion. Repeat echo 01/03/21 showed an LVE at 60-65% with severe aortic stenosis, mean gradient 54 mmHg, peak graadient 90.6 mmHg, AVA 0.56 cm2, and dimensionless index 0.22. She does not speak [place] and was seen with the assistance of an interpreter. She reported progressive dyspnea with exertion but no chest pain, or LE edema. Plan at that time was to continue ith TAVR workup to include [facility] and CT imaging.   Cardiac catheterization performed 05/17/21 showed moderate mid RCA stenosis however did not appear to be flow limiting with mild calcified proximal LAD stenosis and severe aortic stenosis with mean gradient 46.2 mmHg, peak to peak gradient 51 mmHg, AVA 0.76 cm2). There were normal right and left heart pressures  The patient was evaluated by the multidisciplinary valve team and felt to have  severe, symptomatic aortic stenosis and to be a suitable candidate for TAVR, which was set up for 08/13/21.    Hospital Course    Severe AS: s/p successful TAVR with a 68mm Medtronic Evolut FX via the TF approach on 08/13/21. Post operative echo pending. Groin sites are stable. ECG with NSR and no high grade heart block. Continue home ASA 81mg  PO QD. Spoke with [facility] with plans for pick up today at 1pm. Discharge paperwork reviewed. SBE to be discussed at follow up appointment next week. Plan one week follow up, then one month with echocardiogram   HTN: Elevated, 141/59. Plan to resume home antihypertensives today.   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 GFR <45. Will plan to recheck BMET at follow up next week. Likely secondary to contrast  dye with procedure yesterday   PAD: No complaints today. Resume home ASA and statin    Consultants: None    The patient was seen and examined by Dr. Angelena Form who feels that she is stable and ready for discharge today, 08/14/21. She has ambulated with CR without complication. Post procedure care reviewed with the patient through family interpreter at bedside.  _____________  Discharge Vitals Blood pressure (!) 141/59, pulse 88, temperature 100.2 F (37.9 C), temperature source Oral, resp. rate 20, height 4\' 11"  (1.499 m), weight 61.9 kg, SpO2 92 %.  Filed Weights   08/13/21 0718 08/13/21 1339 08/14/21 0440  Weight: 63 kg 64.3 kg 61.9 kg   General: Well developed, well nourished, NAD Lungs:Clear to ausculation bilaterally. No wheezes, rales, or rhonchi. Breathing is unlabored. Cardiovascular: RRR with S1 S2. Soft murmur Extremities: No edema.  Neuro: Alert and oriented. No focal deficits. No facial asymmetry. MAE spontaneously. Psych: Responds to questions appropriately with normal affect.    Labs & Radiologic Studies    CBC Recent Labs    08/12/21 1159 08/13/21 0900 08/13/21 1150 08/14/21 0058  WBC 12.7*  --   --  13.8*  HGB 9.4*   < > 9.2* 8.7*  HCT 31.2*   < > 27.0* 28.8*  MCV 84.8  --   --  85.0  PLT 409*  --   --  341   < > = values in this interval not displayed.   Basic Metabolic Panel Recent Labs    08/12/21 1159 08/13/21 0900 08/13/21 1150 08/14/21 0058  NA 130*   < > 140 136  K 3.3*   < > 3.4* 4.7  CL 96*   < > 101 101  CO2 22  --   --  24  GLUCOSE 189*   < > 171* 169*  BUN 21   < > 23 25*  CREATININE 1.18*   < > 1.00 1.22*  CALCIUM 9.2  --   --  8.6*  MG  --   --   --  1.7   < > = values in this interval not displayed.   Liver Function Tests Recent Labs    08/12/21 1159  AST 21  ALT 13  ALKPHOS 70  BILITOT 0.3  PROT 8.1  ALBUMIN 3.2*   No results for input(s): LIPASE, AMYLASE in the last 72 hours. Cardiac Enzymes No results for input(s):  CKTOTAL, CKMB, CKMBINDEX, TROPONINI in the last 72 hours. BNP Invalid input(s): POCBNP D-Dimer No results for input(s): DDIMER in the last 72 hours. Hemoglobin A1C No results for input(s): HGBA1C in the last 72 hours. Fasting Lipid  Panel No results for input(s): CHOL, HDL, LDLCALC, TRIG, CHOLHDL, LDLDIRECT in the last 72 hours. Thyroid Function Tests No results for input(s): TSH, T4TOTAL, T3FREE, THYROIDAB in the last 72 hours.  Invalid input(s): FREET3 _____________  DG Chest 2 View  Result Date: 08/12/2021 CLINICAL DATA:  Preoperative evaluation for aortic stenosis surgery. EXAM: CHEST - 2 VIEW COMPARISON:  X-ray chest 07/03/2021. FINDINGS: No focal consolidation. No pleural effusion or pneumothorax. Mild stable cardiomegaly. No acute osseous abnormality. IMPRESSION: No active cardiopulmonary disease. Electronically Signed   By: Kathreen Devoid M.D.   On: 08/12/2021 15:37   ECHOCARDIOGRAM LIMITED  Result Date: 08/13/2021    ECHOCARDIOGRAM LIMITED REPORT   Patient Name:   [facility] Date of Exam: 08/13/2021 Medical Rec #:  [id number]     Height:       59.0 in Accession #:    [id number]    Weight:       138.9 lb Date of Birth:  1941-07-28      BSA:          1.580 m Patient Age:    25 years      BP:           190/59 mmHg Patient Gender: F             HR:           69 bpm. Exam Location:  Inpatient Procedure: Limited Echo, Cardiac Doppler and Color Doppler Indications:    Aortic valve disorder I35.9  History:        Patient has prior history of Echocardiogram examinations, most                 recent 01/03/2021. Signs/Symptoms:Murmur; Risk                 Factors:Hypertension. Chronic kidney disease.                 Aortic Valve: 26 mm CoreValve-Evolut Pro prosthetic, stented                 (TAVR) valve is present in the aortic position. Procedure Date:                 08/13/2021.  Sonographer:    Darlina Sicilian RDCS Referring Phys: [place]  1. Interventional echo for  TAVR placement. Prior to the procedure, The aortic valve was calcified. Aortic valve regurgitation is not visualized. Mean gradient of 37 mm Hg Peak gradient 60 mm Hg, DVI 0.27. AVA 0.55 cm2.  2. Post procedure, there is a 26 mm CoreValve-Evolut Pro prosthetic (TAVR) valve present in the aortic position. Procedure Date: 08/13/2021. Mean gradient 5 mm Hg, Peak gradient 11 mg Hg. There is trace to mild PVL best seen in the [facility] view. DVI 0.85. EOa 3.46 cm2. Successful TAVR placement.  3. Left ventricular ejection fraction, by estimation, is 60 to 65%. The left ventricle has normal function. The left ventricle has no regional wall motion abnormalities.  4. The mitral valve is normal in structure.  5. Right ventricular systolic function is normal. The right ventricular size is normal. FINDINGS  Left Ventricle: Left ventricular ejection fraction, by estimation, is 60 to 65%. The left ventricle has normal function. The left ventricle has no regional wall motion abnormalities. Right Ventricle: The right ventricular size is normal. Right ventricular systolic function is normal. Mitral Valve: The mitral valve is normal in structure. Aortic Valve: The aortic valve is calcified. Aortic valve regurgitation is  not visualized. Aortic valve mean gradient measures 28.2 mmHg. Aortic valve peak gradient measures 44.6 mmHg. Aortic valve area, by VTI measures 1.97 cm. There is a 26 mm CoreValve-Evolut Pro prosthetic, stented (TAVR) valve present in the aortic position. Procedure Date: 08/13/2021. LEFT VENTRICLE PLAX 2D LVOT diam:     2.30 cm LV SV:         148 LV SV Index:   94 LVOT Area:     4.15 cm  AORTIC VALVE AV Area (Vmax):    1.77 cm AV Area (Vmean):   1.56 cm AV Area (VTI):     1.97 cm AV Vmax:           333.75 cm/s AV Vmean:          238.500 cm/s AV VTI:            0.751 m AV Peak Grad:      44.6 mmHg AV Mean Grad:      28.2 mmHg LVOT Vmax:         142.00 cm/s LVOT Vmean:        89.650 cm/s LVOT VTI:          0.356 m  LVOT/AV VTI ratio: 0.47  SHUNTS Systemic VTI:  0.36 m Systemic Diam: 2.30 cm Rudean Haskell MD Electronically signed by Rudean Haskell MD Signature Date/Time: 08/13/2021/2:44:19 PM    Final    Structural Heart Procedure  Result Date: 08/13/2021 Successful placement oa 26 mm Medtronic Evolut Pro THV, right transfemoral approach. See full note in progress notes section for details   Disposition   Pt is being discharged home today in good condition.  Follow-up Plans & Appointments    Follow-up Information     Eileen Stanford, PA-C. Go on 08/22/2021.   Specialties: Cardiology, Radiology Why: @ 2:30pm, please arrive at least 10 minutes early. Contact information: [address] [place] [address] [phone]                Discharge Instructions     Call MD for:  difficulty breathing, headache or visual disturbances   Complete by: As directed    Call MD for:  extreme fatigue   Complete by: As directed    Call MD for:  hives   Complete by: As directed    Call MD for:  persistant dizziness or light-headedness   Complete by: As directed    Call MD for:  persistant nausea and vomiting   Complete by: As directed    Call MD for:  redness, tenderness, or signs of infection (pain, swelling, redness, odor or green/yellow discharge around incision site)   Complete by: As directed    Call MD for:  severe uncontrolled pain   Complete by: As directed    Call MD for:  temperature >100.4   Complete by: As directed    Diet - low sodium heart healthy   Complete by: As directed    Discharge instructions   Complete by: As directed    ACTIVITY AND EXERCISE  Daily activity and exercise are an important part of your recovery. People recover at different rates depending on their general health and type of valve procedure.  Most people recovering from TAVR feel better relatively quickly   No lifting, pushing, pulling more than 10 pounds (examples to  avoid: groceries, vacuuming, gardening, golfing):             - For one week with a procedure through the groin.             -  For six weeks for procedures through the chest wall or neck. NOTE: You will typically see one of our providers 7-14 days after your procedure to discuss Rosa Sanchez the above activities.      DRIVING  Do not drive until you are seen for follow up and cleared by a provider. Generally, we ask patient to not drive for 1 week after their procedure.  If you have been told by your doctor in the past that you may not drive, you must talk with him/her before you begin driving again.   DRESSING  Groin site: you may leave the clear dressing over the site for up to one week or until it falls off.   HYGIENE  If you had a femoral (leg) procedure, you may take a shower when you return home. After the shower, pat the site dry. Do NOT use powder, oils or lotions in your groin area until the site has completely healed.  If you had a chest procedure, you may shower when you return home unless specifically instructed not to by your discharging practitioner.             - DO NOT scrub incision; pat dry with a towel.             - DO NOT apply any lotions, oils, powders to the incision.             - No tub baths / swimming for at least 2 weeks.  If you notice any fevers, chills, increased pain, swelling, bleeding or pus, please contact your doctor.   ADDITIONAL INFORMATION  If you are going to have an upcoming dental procedure, please contact our office as you will require antibiotics ahead of time to prevent infection on your heart valve.    If you have any questions or concerns you can call the structural heart phone during normal business hours 8am-4pm. If you have an urgent need after hours or weekends please call [phone] to talk to the on call provider for general cardiology. If you have an emergency that requires immediate attention, please call 911.    After  TAVR Checklist  Check  Test Description  Follow up appointment in 1-2 weeks  You will see our structural heart advanced practice provider. Your incision sites will be checked and you will be cleared to drive and resume all normal activities if you are doing well.    1 month echo and follow up  You will have an echo to check on your new heart valve and be seen back in the office by a structural heart advanced practice provider.  Follow up with your primary cardiologist You will need to be seen by your primary cardiologist in the following 3-6 months after your 1 month appointment in the valve clinic. Often times your Plavix or Aspirin will be discontinued during this time, but this is decided on a case by case basis.   1 year echo and follow up You will have another echo to check on your heart valve after 1 year and be seen back in the office by a structural heart advanced practice provider. This your last structural heart visit.  Bacterial endocarditis prophylaxis  You will have to take antibiotics for the rest of your life before all dental procedures (even teeth cleanings) to protect your heart valve. Antibiotics are also required before some surgeries. Please check with your cardiologist before scheduling any surgeries. Also, please make sure to tell us if you  have a penicillin allergy as you will require an alternative antibiotic.   If the dressing is still on your incision site when you go home, remove it on the third day after your surgery date. Remove dressing if it begins to fall off, or if it is dirty or damaged before the third day.   Complete by: As directed    Increase activity slowly   Complete by: As directed       Discharge Medications   Allergies as of 08/14/2021       Reactions   Tylenol [acetaminophen]    Pt reports long time ago she got sick on tylenol she not sure what happen but takes Tylenol now.        Medication List     TAKE these medications    amLODipine 10 MG  tablet Commonly known as: NORVASC Take 10 mg by mouth daily.   aspirin EC 81 MG tablet Take 81 mg by mouth daily.   BIOFREEZE EX Apply 1 application topically 4 (four) times daily as needed (joint/muscle pain). 3.5 % Gel   Calcium Carb-Cholecalciferol 600-20 MG-MCG Tabs Take 1 tablet by mouth in the morning and at bedtime.   eucerin cream Apply 1 application topically in the morning and at bedtime.   ferrous sulfate 325 (65 FE) MG tablet Take 325 mg by mouth daily.   losartan-hydrochlorothiazide 100-12.5 MG tablet Commonly known as: HYZAAR Take 1 tablet by mouth daily.   pravastatin 80 MG tablet Commonly known as: PRAVACHOL Take 80 mg by mouth daily.   prednisoLONE acetate 1 % ophthalmic suspension Commonly known as: PRED FORTE Place 1 drop into the left eye 4 (four) times daily.   PRESERVISION AREDS 2 PO Take 1 tablet by mouth in the morning and at bedtime.   SALONPAS PAIN RELIEF PATCH EX Place 1 patch onto the skin every 8 (eight) hours as needed (pain).   senna-docusate 8.6-50 MG tablet Commonly known as: Senokot-S Take 2 tablets by mouth at bedtime.   Systane Hydration PF 0.4-0.3 % Soln Generic drug: Polyethyl Glyc-Propyl Glyc PF Place 1 drop into both eyes 4 (four) times daily as needed (dry eyes).   tolnaftate 1 % spray Commonly known as: TINACTIN Apply 1 spray topically daily. Applied to feet               Discharge Care Instructions  (From admission, onward)           Start     Ordered   08/14/21 0000  If the dressing is still on your incision site when you go home, remove it on the third day after your surgery date. Remove dressing if it begins to fall off, or if it is dirty or damaged before the third day.        08/14/21 1109            Outstanding Labs/Studies   BMET   Duration of Discharge Encounter   Greater than 30 minutes including physician time.  Signed, Kathyrn Drown, NP 08/14/2021, 11:09 AM  I have personally seen  and examined this patient. I agree with the assessment and plan as outlined above. Day one post TAVR She is doing well. Groins stable. BP stable. D/C home today.   Lauree Chandler 08/14/2021  1:37 PM  [phone]

## 2021-08-14 NOTE — Progress Notes (Signed)
CARDIAC REHAB PHASE I   PRE:  Rate/Rhythm: 83 SR    BP: sitting 140/64    SaO2: 97 RA  MODE:  Ambulation: 280 ft   POST:  Rate/Rhythm: 103 ST    BP: sitting 150/86     SaO2: 97 RA  Ambulated slowly with RW. C/o her normal left knee pain and new left great toe pain. When I ask about her groins, she says they hurt too. Communication is difficult. Did not give exercise guidelines due to knee issues, would not be appropriate for CRPII. [place], ACSM 08/14/2021 12:28 PM

## 2021-08-14 NOTE — Progress Notes (Shared)
Nursing Dc note  Dc intructions reviewed with son Nalina Yeatman with the help of interpreter Y Keo Eban. Both son and patient verbalized understanding of dc instructions. Ccmd notified of dc order. Pivs dcd site unremarkable.

## 2021-08-14 NOTE — Progress Notes (Signed)
Patient to be transported home via [facility].

## 2021-08-14 NOTE — Progress Notes (Signed)
  Echocardiogram 2D Echocardiogram has been performed.  Darlina Sicilian M 08/14/2021, 9:17 AM

## 2021-08-15 ENCOUNTER — Telehealth: Payer: Self-pay | Admitting: Physician Assistant

## 2021-08-15 NOTE — Telephone Encounter (Signed)
  [place] VALVE TEAM   Patient contacted regarding discharge from [facility] on 12/7  Patient understands to follow up with a structural heart APP on 12/15 at [place].  Patient understands discharge instructions? yes Patient understands medications and regimen? yes Patient understands to bring all medications to this visit? yes  I spoke to granddaughter who said she is okay. Speaks broken english.  Angelena Form PA-C  [facility]

## 2021-08-16 ENCOUNTER — Emergency Department (HOSPITAL_COMMUNITY): Payer: Medicare (Managed Care)

## 2021-08-16 ENCOUNTER — Encounter (HOSPITAL_COMMUNITY): Payer: Self-pay

## 2021-08-16 ENCOUNTER — Telehealth: Payer: Self-pay | Admitting: Physician Assistant

## 2021-08-16 ENCOUNTER — Other Ambulatory Visit: Payer: Self-pay

## 2021-08-16 ENCOUNTER — Emergency Department (HOSPITAL_COMMUNITY)
Admission: EM | Admit: 2021-08-16 | Discharge: 2021-08-16 | Disposition: A | Discharge status: Home / Self Care | Payer: Medicare (Managed Care) | Attending: Emergency Medicine | Admitting: Emergency Medicine

## 2021-08-16 DIAGNOSIS — Z7982 Long term (current) use of aspirin: Secondary | ICD-10-CM | POA: Insufficient documentation

## 2021-08-16 DIAGNOSIS — I129 Hypertensive chronic kidney disease with stage 1 through stage 4 chronic kidney disease, or unspecified chronic kidney disease: Secondary | ICD-10-CM | POA: Insufficient documentation

## 2021-08-16 DIAGNOSIS — Z79899 Other long term (current) drug therapy: Secondary | ICD-10-CM | POA: Insufficient documentation

## 2021-08-16 DIAGNOSIS — I300 Acute nonspecific idiopathic pericarditis: Secondary | ICD-10-CM

## 2021-08-16 DIAGNOSIS — I309 Acute pericarditis, unspecified: Secondary | ICD-10-CM | POA: Insufficient documentation

## 2021-08-16 DIAGNOSIS — R079 Chest pain, unspecified: Secondary | ICD-10-CM

## 2021-08-16 DIAGNOSIS — R072 Precordial pain: Secondary | ICD-10-CM | POA: Diagnosis present

## 2021-08-16 DIAGNOSIS — Z952 Presence of prosthetic heart valve: Secondary | ICD-10-CM | POA: Diagnosis not present

## 2021-08-16 DIAGNOSIS — N183 Chronic kidney disease, stage 3 unspecified: Secondary | ICD-10-CM | POA: Diagnosis not present

## 2021-08-16 LAB — CBC WITH DIFFERENTIAL/PLATELET
Abs Immature Granulocytes: 0.5 10*3/uL — ABNORMAL HIGH (ref 0.00–0.07)
Basophils Absolute: 0.1 10*3/uL (ref 0.0–0.1)
Basophils Relative: 0 %
Eosinophils Absolute: 0 10*3/uL (ref 0.0–0.5)
Eosinophils Relative: 0 %
HCT: 26.6 % — ABNORMAL LOW (ref 36.0–46.0)
Hemoglobin: 8.1 g/dL — ABNORMAL LOW (ref 12.0–15.0)
Immature Granulocytes: 2 %
Lymphocytes Relative: 10 %
Lymphs Abs: 2.6 10*3/uL (ref 0.7–4.0)
MCH: 25.6 pg — ABNORMAL LOW (ref 26.0–34.0)
MCHC: 30.5 g/dL (ref 30.0–36.0)
MCV: 83.9 fL (ref 80.0–100.0)
Monocytes Absolute: 2.8 10*3/uL — ABNORMAL HIGH (ref 0.1–1.0)
Monocytes Relative: 10 %
Neutro Abs: 21.1 10*3/uL — ABNORMAL HIGH (ref 1.7–7.7)
Neutrophils Relative %: 78 %
Platelets: 314 10*3/uL (ref 150–400)
RBC: 3.17 MIL/uL — ABNORMAL LOW (ref 3.87–5.11)
RDW: 13.9 % (ref 11.5–15.5)
WBC: 27 10*3/uL — ABNORMAL HIGH (ref 4.0–10.5)
nRBC: 0 % (ref 0.0–0.2)

## 2021-08-16 LAB — COMPREHENSIVE METABOLIC PANEL
ALT: 16 U/L (ref 0–44)
AST: 30 U/L (ref 15–41)
Albumin: 2.7 g/dL — ABNORMAL LOW (ref 3.5–5.0)
Alkaline Phosphatase: 73 U/L (ref 38–126)
Anion gap: 11 (ref 5–15)
BUN: 17 mg/dL (ref 8–23)
CO2: 24 mmol/L (ref 22–32)
Calcium: 8.2 mg/dL — ABNORMAL LOW (ref 8.9–10.3)
Chloride: 93 mmol/L — ABNORMAL LOW (ref 98–111)
Creatinine, Ser: 1.34 mg/dL — ABNORMAL HIGH (ref 0.44–1.00)
GFR, Estimated: 40 mL/min — ABNORMAL LOW (ref >60)
Glucose, Bld: 170 mg/dL — ABNORMAL HIGH (ref 70–99)
Potassium: 3.4 mmol/L — ABNORMAL LOW (ref 3.5–5.1)
Sodium: 128 mmol/L — ABNORMAL LOW (ref 135–145)
Total Bilirubin: 1 mg/dL (ref 0.3–1.2)
Total Protein: 7.3 g/dL (ref 6.5–8.1)

## 2021-08-16 LAB — TROPONIN I (HIGH SENSITIVITY): Troponin I (High Sensitivity): 173 ng/L (ref <18)

## 2021-08-16 MED ORDER — IOHEXOL 350 MG/ML SOLN
100.0000 mL | Freq: Once | INTRAVENOUS | Status: AC | PRN
  Administered 2021-08-16: 100 mL via INTRAVENOUS

## 2021-08-16 MED ORDER — IBUPROFEN 400 MG PO TABS: 400.0000 mg | ORAL_TABLET | Freq: Two times a day (BID) | ORAL | 0 refills | Status: AC

## 2021-08-16 MED ORDER — COLCHICINE 0.6 MG PO TABS: 0.6000 mg | ORAL_TABLET | Freq: Every day | ORAL | 0 refills | Status: DC

## 2021-08-16 NOTE — ED Notes (Signed)
Spoke to family member, H, on the phone who was able to translate. No other translators available for [employer]

## 2021-08-16 NOTE — Telephone Encounter (Signed)
Spoke with NP with [facility] who reports pt is currently there and complaining of CP with radiation to her shoulders and back.  NP states pt looks fairly stable but is s/p TAVR on 08/13/2021.  EMS is there and has completed an EKG that is questionable.  NP states there is not a current interpreter with pt. Recommended pt should be seen in the ED for further evaluation due to active CP with radiation and questionable EKG.  NP verbalizes understanding and agrees with current plan.

## 2021-08-16 NOTE — ED Provider Notes (Signed)
[facility] EMERGENCY DEPARTMENT Provider Note   CSN: [id number] Arrival date & time: 08/16/21  1550     History Chief Complaint  Patient presents with   Chest Pain    Krystal Maddox is a 80 y.o. female.  The history is provided by the patient, medical records and the EMS personnel. The history is limited by a language barrier.  Chest Pain Pain location:  Substernal area Pain quality: aching   Pain radiates to:  Upper back Pain severity:  Moderate Duration:  1 day Timing:  Constant Progression:  Worsening Context comment:  She is 2 days status post TAVR Relieved by:  Nothing Ineffective treatments:  None tried Associated symptoms: no abdominal pain, no back pain, no cough, no fever, no palpitations, no shortness of breath and no vomiting       Past Medical History:  Diagnosis Date   CKD (chronic kidney disease), stage III (HCC)    Hypertension    Onychomycosis    Osteoporosis    Peripheral arterial disease (HCC)    OF LEFT EXTREMITY    Primary osteoarthritis of right knee 05/26/2017   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 Angelena Form and Dr Cyndia Bent.   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   Severe aortic stenosis    Primary osteoarthritis of right knee 05/26/2017    Past Surgical History:  Procedure Laterality Date   EYE SURGERY Left 2022   cataract   INTRAOPERATIVE TRANSTHORACIC ECHOCARDIOGRAM N/A 08/13/2021   Procedure: INTRAOPERATIVE TRANSTHORACIC ECHOCARDIOGRAM;  Surgeon: Burnell Blanks, MD;  Location: [facility];  Service:  Open Heart Surgery;  Laterality: N/A;   KNEE SURGERY Left    RIGHT/LEFT HEART CATH AND CORONARY ANGIOGRAPHY N/A 05/17/2021   Procedure: RIGHT/LEFT HEART CATH AND CORONARY ANGIOGRAPHY;  Surgeon: Burnell Blanks, MD;  Location: [facility];  Service: Cardiovascular;  Laterality: N/A;   TRANSCATHETER AORTIC VALVE REPLACEMENT, TRANSFEMORAL N/A 08/13/2021   Procedure: TRANSCATHETER AORTIC VALVE REPLACEMENT, TRANSFEMORAL;  Surgeon: Burnell Blanks, MD;  Location: [facility];  Service: Open Heart Surgery;  Laterality: N/A;     OB History   No obstetric history on file.     Family History  Problem Relation Age of Onset   Hypertension Mother     Social History   Tobacco Use   Smoking status: Never   Smokeless tobacco: Never  Vaping Use   Vaping Use: Never used  Substance Use Topics   Alcohol use: Never   Drug use: Never    Home Medications Prior to Admission medications   Medication Sig Start Date End Date Taking? Authorizing Provider  amLODipine (NORVASC) 10 MG tablet Take 10 mg by mouth daily.  03/18/17   [provider]  aspirin EC 81 MG tablet Take 81 mg by mouth daily.    [provider]  Calcium Carb-Cholecalciferol 600-20 MG-MCG TABS Take 1 tablet by mouth in the morning and at bedtime.    [provider]  colchicine 0.6 MG tablet Take 1 tablet (0.6 mg total) by mouth daily.  08/16/21 09/15/21 Yes Kirklin Mcduffee, Burnadette Peter, MD  ferrous sulfate 325 (65 FE) MG tablet Take 325 mg by mouth daily.    [provider]  ibuprofen (ADVIL) 400 MG tablet Take 1 tablet (400 mg total) by mouth in the morning and at bedtime. 08/16/21 09/15/21 Yes Rudean Icenhour, Burnadette Peter, MD  losartan-hydrochlorothiazide (HYZAAR) 100-12.5 MG tablet Take 1 tablet by mouth daily. 05/06/17   [provider]  Menthol, Topical Analgesic, (BIOFREEZE EX) Apply 1 application topically 4 (four) times daily as needed (joint/muscle pain). 3.5 % Gel    [provider]  Menthol-Methyl Salicylate (SALONPAS PAIN RELIEF PATCH EX) Place 1 patch onto the skin every 8 (eight) hours as needed (pain).    [provider]  Multiple Vitamins-Minerals (PRESERVISION AREDS 2 PO) Take 1 tablet by mouth in the morning and at bedtime.    [provider]  Polyethyl Glyc-Propyl Glyc PF (SYSTANE HYDRATION PF) 0.4-0.3 % SOLN Place 1 drop into both eyes 4 (four) times daily as needed (dry eyes).    [provider]  pravastatin (PRAVACHOL) 80 MG tablet Take 80 mg by mouth daily.    [provider]  prednisoLONE acetate (PRED FORTE) 1 % ophthalmic suspension Place 1 drop into the left eye 4 (four) times daily.    [provider]  senna-docusate (SENOKOT-S) 8.6-50 MG tablet Take 2 tablets by mouth at bedtime.    [provider]  Skin Protectants, Misc. (EUCERIN) cream Apply 1 application topically in the morning and at bedtime.    [provider]  tolnaftate (TINACTIN) 1 % spray Apply 1 spray topically daily. Applied to Sales executive, Historical, MD    Allergies    Tylenol [acetaminophen]  Review of Systems   Review of Systems  Constitutional:  Negative for chills and fever.  HENT:  Negative for ear pain and sore throat.   Eyes:  Negative for pain and visual disturbance.  Respiratory:  Negative for cough and shortness of breath.   Cardiovascular:  Positive for chest pain. Negative for palpitations.  Gastrointestinal:  Negative for abdominal pain and vomiting.  Genitourinary:  Negative for dysuria and hematuria.  Musculoskeletal:  Negative for arthralgias and back pain.  Skin:  Negative for color change and rash.  Neurological:  Negative for seizures and syncope.  All other systems reviewed and are negative.  Physical Exam Updated Vital Signs BP (!) 100/48   Pulse 69   Temp 100 F (37.8 C) (Oral)   Resp (!) 21   SpO2 95%   Physical Exam Vitals and nursing note reviewed.  Constitutional:       General: She is not in acute distress.    Appearance: Normal appearance. She is well-developed.  HENT:     Head: Normocephalic and atraumatic.     Right Ear: External ear normal.     Left Ear: External ear normal.     Nose: Nose normal. No congestion or rhinorrhea.     Mouth/Throat:     Mouth: Mucous membranes are moist.  Eyes:     Extraocular Movements: Extraocular movements intact.     Conjunctiva/sclera: Conjunctivae normal.     Pupils: Pupils are equal, round, and reactive to light.  Cardiovascular:     Rate and Rhythm: Normal rate and regular rhythm.     Pulses: Normal pulses.          Radial pulses are 2+ on the right side and 2+ on the left side.     Heart sounds:  No murmur heard. Pulmonary:     Effort: Pulmonary effort is normal. No respiratory distress.     Breath sounds: Normal breath sounds. No wheezing, rhonchi or rales.  Chest:     Chest wall: No tenderness.  Abdominal:     General: Abdomen is flat. Bowel sounds are normal.     Palpations: Abdomen is soft.     Tenderness: There is no abdominal tenderness. There is no guarding or rebound.  Musculoskeletal:        General: No swelling, tenderness or deformity.     Cervical back: Normal range of motion and neck supple. No rigidity.     Right lower leg: No tenderness. No edema.     Left lower leg: No tenderness. No edema.  Skin:    General: Skin is warm and dry.     Capillary Refill: Capillary refill takes less than 2 seconds.  Neurological:     General: No focal deficit present.     Mental Status: She is alert and oriented to person, place, and time.  Psychiatric:        Mood and Affect: Mood normal.    ED Results / Procedures / Treatments   Labs (all labs ordered are listed, but only abnormal results are displayed) Labs Reviewed  CBC WITH DIFFERENTIAL/PLATELET - Abnormal; Notable for the following components:      Result Value   WBC 27.0 (*)    RBC 3.17 (*)    Hemoglobin 8.1 (*)    HCT 26.6 (*)     MCH 25.6 (*)    Neutro Abs 21.1 (*)    Monocytes Absolute 2.8 (*)    Abs Immature Granulocytes 0.50 (*)    All other components within normal limits  COMPREHENSIVE METABOLIC PANEL - Abnormal; Notable for the following components:   Sodium 128 (*)    Potassium 3.4 (*)    Chloride 93 (*)    Glucose, Bld 170 (*)    Creatinine, Ser 1.34 (*)    Calcium 8.2 (*)    Albumin 2.7 (*)    GFR, Estimated 40 (*)    All other components within normal limits  TROPONIN I (HIGH SENSITIVITY) - Abnormal; Notable for the following components:   Troponin I (High Sensitivity) 173 (*)    All other components within normal limits    EKG EKG Interpretation  Date/Time:  Friday August 16 2021 15:52:58 EST Ventricular Rate:  80 PR Interval:  187 QRS Duration: 95 QT Interval:  412 QTC Calculation: 476 R Axis:   46 Text Interpretation: Sinus rhythm Atrial premature complexes ST elevation, consider inferior injury Confirmed by Nanda Quinton [phone]) on 08/16/2021 3:54:23 PM  Radiology DG Chest Portable 1 View  Result Date: 08/16/2021 CLINICAL DATA:  Chest pain after TAVR EXAM: PORTABLE CHEST 1 VIEW COMPARISON:  08/12/2021 FINDINGS: Status post TAVR. Midline trachea. Mild cardiomegaly. Atherosclerosis in the transverse aorta. Probable small left pleural effusion. No pneumothorax. No congestive failure. Remote right rib trauma. Suspicion of left base airspace disease. IMPRESSION: Small left pleural effusion with adjacent Airspace disease, likely atelectasis. Cardiomegaly, without congestive failure. Aortic Atherosclerosis (ICD10-I70.0). Electronically Signed   By: Abigail Miyamoto M.D.   On: 08/16/2021 16:32   CT Angio Chest Aorta W and/or Wo Contrast  Result Date: 08/16/2021 CLINICAL DATA:  Acute aortic syndrome.  Chest pain. EXAM: CT ANGIOGRAPHY CHEST WITH CONTRAST TECHNIQUE: Multidetector CT imaging of the chest was performed using the standard protocol during bolus administration of intravenous contrast.  Multiplanar CT image  reconstructions and MIPs were obtained to evaluate the vascular anatomy. CONTRAST:  163mL OMNIPAQUE IOHEXOL 350 MG/ML SOLN COMPARISON:  06/14/2021 FINDINGS: Cardiovascular: Ascending aortic stent in place. No evidence of aortic aneurysm with maximum aortic diameter in the ascending thoracic aorta 2.9 cm. No dissection. Heart is normal size. Coronary artery calcifications in the left anterior descending coronary artery. No filling defects in the pulmonary arteries to suggest pulmonary emboli. Mediastinum/Nodes: No mediastinal, hilar, or axillary adenopathy. Trachea and esophagus are unremarkable. Thyroid unremarkable. Lungs/Pleura: Left basilar atelectasis. No confluent opacities or effusions. Upper Abdomen: Gallstones within the gallbladder.  No acute findings Musculoskeletal: Chest wall soft tissues are unremarkable. No acute bony abnormality. Review of the MIP images confirms the above findings. IMPRESSION: No evidence of aortic aneurysm or dissection. Prior stent placement in the ascending thoracic aorta. No evidence of pulmonary embolus. Coronary artery disease. Cholelithiasis. Aortic Atherosclerosis (ICD10-I70.0). Electronically Signed   By: Rolm Baptise M.D.   On: 08/16/2021 17:49    Procedures Procedures   Medications Ordered in ED Medications  iohexol (OMNIPAQUE) 350 MG/ML injection 100 mL (100 mLs Intravenous Contrast Given 08/16/21 1707)    ED Course  I have reviewed the triage vital signs and the nursing notes.  Pertinent labs & imaging results that were available during my care of the patient were reviewed by me and considered in my medical decision making (see chart for details).    MDM Rules/Calculators/A&P                          This is an 80 year old female who is 2 days status post TAVR.  She is presenting with acute onset chest pain radiating to her back.  She was given aspirin and nitro in route.  She is still complaining of pain.  She is hemodynamically  stable on arrival.  EKG showed diffuse ST changes that are concerning for possible pericarditis.  There are no reciprocal changes.  Bedside ultrasound performed and showed trace pericardial effusion.  Nonspecific wall motion abnormalities noted, but no obvious right heart strain.  IVC appropriately collapsible.  Discussed with cardiology team.  EKG reviewed.  Presentation certainly concerning for pericarditis in the setting of her recent procedure.  Recommend obtaining CT chest to further evaluate for aortic pathology.  CTA chest reviewed.  No evidence of aortic dissection.  No significant pericardial effusion.  No secondary signs of heart strain.  Troponin is elevated to the 120s.  Discussed with cardiology team.  Patient has had resolution of chest pain.  She remains vitally stable. EKG and presentation most consistent with pericarditis.  Will initiate patient on low-dose anti-inflammatories with Motrin 40 mg twice daily and colchicine 0.6 mg daily.  Patient offered admission for observation, but states she would like to go home.  Recommended close follow-up with cardiology team early next week for reassessment.  Strict return to ED precautions provided.  Encouraged patient to hold her Hyzaar in the setting of a mild AKI and initiation of anti-inflammatories as above.  Final Clinical Impression(s) / ED Diagnoses Final diagnoses:  Nonspecific chest pain  Acute pericarditis, unspecified type    Rx / DC Orders ED Discharge Orders          Ordered    ibuprofen (ADVIL) 400 MG tablet  2 times daily        08/16/21 1847    colchicine 0.6 MG tablet  Daily        08/16/21 1847  Idamae Lusher, MD 08/16/21 Frances Furbish, MD 08/17/21 1328

## 2021-08-16 NOTE — ED Notes (Signed)
Pt in CT.

## 2021-08-16 NOTE — Discharge Instructions (Addendum)
1) we are concerned you have inflammation surrounding your heart called pericarditis.  Please pick up your prescriptions for ibuprofen 400 mg to be taken twice daily and colchicine 0.6 mg to be taken once daily. 2) please follow-up with your cardiologist next week for reassessment. 3) your kidney function is slightly worse than normal.  As your blood pressure appears controlled, please discontinue your Hyzaar until otherwise instructed 4) return to the ED with any worsening chest pain, difficulty breathing, fevers

## 2021-08-16 NOTE — Telephone Encounter (Signed)
   Pt c/o of Chest Pain: STAT if CP now or developed within 24 hours  1. Are you having CP right now? Yes   2. Are you experiencing any other symptoms (ex. SOB, nausea, vomiting, sweating)? None   3. How long have you been experiencing CP? Last night  4. Is your CP continuous or coming and going? continuous   5. Have you taken Nitroglycerin? No    ?

## 2021-08-16 NOTE — ED Notes (Signed)
Neither translator tablet or language line solutions phone number [phone]) have the patient's language available. Attempted to contact the patient's case manager at [phone] with no success. Unable to complete accurate triage at this time.

## 2021-08-16 NOTE — ED Triage Notes (Signed)
Pt BIB EMS for c/o central CP that started yesterday. Pt was speaking with a NP for a post-TAVR evaluation and was told to go to the ED due to the CP. NP stated the CP is not to be expected after TAVR. EMS reported 12 lead ECG captured appeared different that pre-TAVR ECGs. EMS administered 324 ASA and nitro SL x1 in route. Unable to tell if pt spit them up or not.  Unable to obtain translation services during triage process. Information above obtained from EMS.   20 LAC 130/62 HR 91 98% RA

## 2021-08-16 NOTE — Consult Note (Addendum)
Cardiology Consultation:   Patient ID: Krystal Maddox MRN: [id number]; DOB: 10-Apr-1941  Admit date: 08/16/2021 Date of Consult: 08/16/2021  PCP:  Prince Solian, MD   [facility] Providers Cardiologist:  Mertie Moores, MD        Patient Profile:   Krystal Maddox is a 80 y.o. female with a hx of TAVR 08/14/2021 who is being seen 08/16/2021 for the evaluation of abnormal EKG with chest discomfort/back discomfort at the request of Dr. Laverta Baltimore.  History of Present Illness:   [name] 80 year old post TAVR 08/13/2021.  Uncomplicated procedure.  Has been experiencing mid chest discomfort, mid back discomfort.  It was suggested that she come to the ER for further evaluation.  Here in the ER, I was able to talk with her via her cell phone with a translator that is local.  She denies any fevers, no diarrhea, no significant coughing, no syncope, no aches no chills.  She states that after she got her CT scan and felt the warmth of the contrast she felt better.  She is currently comfortable, able to complete full sentences without difficulty.  Does not appear to be in any distress.  EKG in emergency department shows diffuse subtle ST elevation compatible with pericarditis.  Echocardiogram post procedure personally reviewed demonstrates no evidence of significant pericardial effusion.  White blood cell count elevated at 27,000 up from 13,800 on 08/14/2021  Hemoglobin 8.1 down from 8.7 on discharge.  Chest x-ray shows small left pleural effusion.  Cardiomegaly.  Chest CT shows no abnormalities, no aortic dissection, no PE, no acute lung findings.    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 Angelena Form and Dr Cyndia Bent.   Severe aortic stenosis    Superficial vein thrombosis 05/21/2021   R arm   Vitamin D deficiency     Past Surgical History:  Procedure Laterality Date   EYE SURGERY Left 2022   cataract   INTRAOPERATIVE TRANSTHORACIC ECHOCARDIOGRAM N/A 08/13/2021   Procedure: INTRAOPERATIVE TRANSTHORACIC ECHOCARDIOGRAM;  Surgeon: Burnell Blanks, MD;  Location: [facility];  Service: Open Heart Surgery;  Laterality: N/A;   KNEE SURGERY Left    RIGHT/LEFT HEART CATH AND CORONARY ANGIOGRAPHY N/A 05/17/2021   Procedure: RIGHT/LEFT HEART CATH AND CORONARY ANGIOGRAPHY;  Surgeon: Burnell Blanks, MD;  Location: [facility];  Service: Cardiovascular;  Laterality: N/A;   TRANSCATHETER AORTIC VALVE REPLACEMENT, TRANSFEMORAL N/A 08/13/2021   Procedure: TRANSCATHETER AORTIC VALVE REPLACEMENT, TRANSFEMORAL;  Surgeon: Burnell Blanks, MD;  Location: [facility];  Service: Open Heart Surgery;  Laterality: N/A;     Home Medications:  Prior to Admission medications   Medication Sig Start Date End Date Taking? Authorizing Provider  amLODipine (NORVASC) 10 MG tablet Take 10 mg by mouth daily.  03/18/17   [provider]  aspirin EC 81 MG tablet Take 81 mg by mouth daily.    [provider]  Calcium Carb-Cholecalciferol 600-20 MG-MCG TABS Take 1 tablet by mouth in the morning and at bedtime.    [provider]  ferrous sulfate 325 (65 FE) MG tablet Take 325 mg  by mouth daily.    [provider]  losartan-hydrochlorothiazide (HYZAAR) 100-12.5 MG tablet Take 1 tablet by mouth daily. 05/06/17   [provider]  Menthol, Topical Analgesic, (BIOFREEZE EX) Apply 1 application topically 4 (four) times daily as needed (joint/muscle pain). 3.5 % Gel    [provider]  Menthol-Methyl Salicylate (SALONPAS PAIN RELIEF PATCH EX) Place 1 patch onto the skin every 8 (eight) hours  as needed (pain).    [provider]  Multiple Vitamins-Minerals (PRESERVISION AREDS 2 PO) Take 1 tablet by mouth in the morning and at bedtime.    [provider]  Polyethyl Glyc-Propyl Glyc PF (SYSTANE HYDRATION PF) 0.4-0.3 % SOLN Place 1 drop into both eyes 4 (four) times daily as needed (dry eyes).    [provider]  pravastatin (PRAVACHOL) 80 MG tablet Take 80 mg by mouth daily.    [provider]  prednisoLONE acetate (PRED FORTE) 1 % ophthalmic suspension Place 1 drop into the left eye 4 (four) times daily.    [provider]  senna-docusate (SENOKOT-S) 8.6-50 MG tablet Take 2 tablets by mouth at bedtime.    [provider]  Skin Protectants, Misc. (EUCERIN) cream Apply 1 application topically in the morning and at bedtime.    [provider]  tolnaftate (TINACTIN) 1 % spray Apply 1 spray topically daily. Applied to Sales executive, Historical, MD    Inpatient Medications: Scheduled Meds:  Continuous Infusions:  PRN Meds:   Allergies:    Allergies  Allergen Reactions   Tylenol [Acetaminophen]     Pt reports long time ago she got sick on tylenol she not sure what happen but takes Tylenol now.    Social History:   Social History   Socioeconomic History   Marital status: Widowed    Spouse name: Not on file   Number of children: 5   Years of education: Not on file   Highest education level: Not on file  Occupational History   Not on file  Tobacco Use   Smoking status: Never   Smokeless tobacco: Never  Vaping Use   Vaping Use: Never used  Substance and Sexual Activity   Alcohol use: Never   Drug use: Never   Sexual activity: Not on file  Other Topics Concern   Not on file  Social History Narrative   Not on file   Social Determinants of Health   Financial Resource Strain: Not on file  Food Insecurity: Not on file  Transportation Needs: Not on file  Physical Activity: Not on file  Stress: Not on  file  Social Connections: Not on file  Intimate Partner Violence: Not on file    Family History:    Family History  Problem Relation Age of Onset   Hypertension Mother      ROS:  Please see the history of present illness.   All other ROS reviewed and negative.     Physical Exam/Data:   Vitals:   08/16/21 1600 08/16/21 1615 08/16/21 1630 08/16/21 1700  BP: 117/80 (!) 126/41 (!) 94/33 (!) 106/56  Pulse: 84 83 82 78  Resp: 20 (!) 22 (!) 22 20  Temp:      TempSrc:      SpO2: 96% 97% 97% 98%   No intake or output data in the 24 hours ending 08/16/21 1726 Last 3 Weights 08/14/2021 08/13/2021 08/13/2021  Weight (lbs) 136 lb 6.4 oz 141 lb 12.1 oz 138 lb 14.2 oz  Weight (  kg) 61.871 kg 64.3 kg 63 kg     There is no height or weight on file to calculate BMI.  General:  Well nourished, well developed, in no acute distress HEENT: normal Neck: no JVD Vascular: No carotid bruits; Distal pulses 2+ bilaterally Cardiac:  normal S1, S2; RRR; no murmur  Lungs:  clear to auscultation bilaterally, no wheezing, rhonchi or rales  Abd: soft, nontender, no hepatomegaly  Ext: no edema Musculoskeletal:  No deformities, BUE and BLE strength normal and equal Skin: warm and dry  Neuro:  CNs 2-12 intact, no focal abnormalities noted Psych:  Normal affect   EKG:  The EKG was personally reviewed and demonstrates: Sinus rhythm 80 with diffuse ST segment elevation and PR depression compatible with acute pericarditis  Telemetry:  Telemetry was personally reviewed and demonstrates: No adverse arrhythmias in ER  Relevant CV Studies:  08/14/2021: ECHO   1. The aortic valve has been replaced. There is a 26 mm CoreValve-Evolut  Pro prosthetic (TAVR) valve present in the aortic position. Procedure  Date: 08/13/2021. Effective orifice area, by VTI measures 2.46 cm. Aortic  valve mean gradient measures 7.0  mmHg. Aortic valve acceleration time measures 56 msec. There is mild PVL  seen A3C view (posterior-  5-6 o'clock on PSAX).   2. Left ventricular ejection fraction, by estimation, is 60 to 65%. The  left ventricle has normal function. The left ventricle has no regional  wall motion abnormalities. Left ventricular diastolic parameters are  consistent with Grade I diastolic  dysfunction (impaired relaxation).   3. Right ventricular systolic function is normal. The right ventricular  size is normal. There is normal pulmonary artery systolic pressure.   4. The mitral valve is abnormal. No evidence of mitral valve  regurgitation. No evidence of mitral stenosis.   Comparison(s): A prior study was performed on 08/13/21. Stable gradients  with increase in PVL from implantation.   Cardiac catheterization performed 05/17/21 showed moderate mid RCA stenosis however did not appear to be flow limiting with mild calcified proximal LAD stenosis     Laboratory Data:  High Sensitivity Troponin:  No results for input(s): TROPONINIHS in the last 720 hours.   Chemistry Recent Labs  Lab 08/12/21 1159 08/13/21 0900 08/13/21 1000 08/13/21 1150 08/14/21 0058  NA 130*   < > 138 140 136  K 3.3*   < > 3.6 3.4* 4.7  CL 96*   < > 100 101 101  CO2 22  --   --   --  24  GLUCOSE 189*   < > 172* 171* 169*  BUN 21   < > 23 23 25*  CREATININE 1.18*   < > 1.00 1.00 1.22*  CALCIUM 9.2  --   --   --  8.6*  MG  --   --   --   --  1.7  GFRNONAA 46*  --   --   --  45*  ANIONGAP 12  --   --   --  11   < > = values in this interval not displayed.    Recent Labs  Lab 08/12/21 1159  PROT 8.1  ALBUMIN 3.2*  AST 21  ALT 13  ALKPHOS 70  BILITOT 0.3   Lipids No results for input(s): CHOL, TRIG, HDL, LABVLDL, LDLCALC, CHOLHDL in the last 168 hours.  Hematology Recent Labs  Lab 08/12/21 1159 08/13/21 0900 08/13/21 1150 08/14/21 0058 08/16/21 1614  WBC 12.7*  --   --  13.8* 27.0*  RBC 3.68*  --   --  3.39* 3.17*  HGB 9.4*   < > 9.2* 8.7* 8.1*  HCT 31.2*   < > 27.0* 28.8* 26.6*  MCV 84.8  --   --  85.0 83.9   MCH 25.5*  --   --  25.7* 25.6*  MCHC 30.1  --   --  30.2 30.5  RDW 14.6  --   --  14.5 13.9  PLT 409*  --   --  341 314   < > = values in this interval not displayed.   Thyroid No results for input(s): TSH, FREET4 in the last 168 hours.  BNP Recent Labs  Lab 08/12/21 1159  BNP 56.4    DDimer No results for input(s): DDIMER in the last 168 hours.   Radiology/Studies:  DG Chest Portable 1 View  Result Date: 08/16/2021 CLINICAL DATA:  Chest pain after TAVR EXAM: PORTABLE CHEST 1 VIEW COMPARISON:  08/12/2021 FINDINGS: Status post TAVR. Midline trachea. Mild cardiomegaly. Atherosclerosis in the transverse aorta. Probable small left pleural effusion. No pneumothorax. No congestive failure. Remote right rib trauma. Suspicion of left base airspace disease. IMPRESSION: Small left pleural effusion with adjacent Airspace disease, likely atelectasis. Cardiomegaly, without congestive failure. Aortic Atherosclerosis (ICD10-I70.0). Electronically Signed   By: Abigail Miyamoto M.D.   On: 08/16/2021 16:32   ECHOCARDIOGRAM COMPLETE  Result Date: 08/14/2021    ECHOCARDIOGRAM REPORT   Patient Name:   [facility] Date of Exam: 08/14/2021 Medical Rec #:  [id number]     Height:       59.0 in Accession #:    [id number]    Weight:       136.4 lb Date of Birth:  1940-09-18      BSA:          1.567 m Patient Age:    39 years      BP:           141/59 mmHg Patient Gender: F             HR:           87 bpm. Exam Location:  Inpatient Procedure: 2D Echo, Cardiac Doppler and Color Doppler Indications:    Post TAVR evaluation V43.3 / Z95.2  History:        Patient has prior history of Echocardiogram examinations, most                 recent 08/13/2021. Risk Factors:Hypertension. Chronic kidney                 disease.                 Aortic Valve: 26 mm CoreValve-Evolut Pro prosthetic, stented                 (TAVR) valve is present in the aortic position. Procedure Date:                 08/13/2021.  Sonographer:    Darlina Sicilian RDCS Referring Phys: [id number] [place]  1. The aortic valve has been replaced. There is a 26 mm CoreValve-Evolut Pro prosthetic (TAVR) valve present in the aortic position. Procedure Date: 08/13/2021. Effective orifice area, by VTI measures 2.46 cm. Aortic valve mean gradient measures 7.0 mmHg. Aortic valve acceleration time measures 56 msec. There is mild PVL seen A3C view (posterior- 5-6 o'clock on PSAX).  2. Left ventricular ejection fraction, by estimation, is 60 to 65%. The left  ventricle has normal function. The left ventricle has no regional wall motion abnormalities. Left ventricular diastolic parameters are consistent with Grade I diastolic dysfunction (impaired relaxation).  3. Right ventricular systolic function is normal. The right ventricular size is normal. There is normal pulmonary artery systolic pressure.  4. The mitral valve is abnormal. No evidence of mitral valve regurgitation. No evidence of mitral stenosis. Comparison(s): A prior study was performed on 08/13/21. Stable gradients with increase in PVL from implantation. FINDINGS  Left Ventricle: Left ventricular ejection fraction, by estimation, is 60 to 65%. The left ventricle has normal function. The left ventricle has no regional wall motion abnormalities. The left ventricular internal cavity size was small. There is no left ventricular hypertrophy. Left ventricular diastolic parameters are consistent with Grade I diastolic dysfunction (impaired relaxation). Right Ventricle: The right ventricular size is normal. No increase in right ventricular wall thickness. Right ventricular systolic function is normal. There is normal pulmonary artery systolic pressure. The tricuspid regurgitant velocity is 2.59 m/s, and  with an assumed right atrial pressure of 3 mmHg, the estimated right ventricular systolic pressure is 32.9 mmHg. Left Atrium: Left atrial size was normal in size. Right Atrium: Right atrial size was normal in  size. Pericardium: There is no evidence of pericardial effusion. Mitral Valve: The mitral valve is abnormal. No evidence of mitral valve regurgitation. No evidence of mitral valve stenosis. Tricuspid Valve: The tricuspid valve is normal in structure. Tricuspid valve regurgitation is mild . No evidence of tricuspid stenosis. Aortic Valve: The aortic valve has been repaired/replaced. Aortic valve regurgitation is mild. Aortic valve mean gradient measures 7.0 mmHg. Aortic valve peak gradient measures 12.6 mmHg. Aortic valve area, by VTI measures 2.46 cm. There is a 26 mm CoreValve-Evolut Pro prosthetic, stented (TAVR) valve present in the aortic position. Procedure Date: 08/13/2021. Pulmonic Valve: The pulmonic valve was not well visualized. Pulmonic valve regurgitation is not visualized. No evidence of pulmonic stenosis. Aorta: The aortic root and ascending aorta are structurally normal, with no evidence of dilitation. IAS/Shunts: The atrial septum is grossly normal.  LEFT VENTRICLE PLAX 2D LVIDd:         3.60 cm   Diastology LVIDs:         2.40 cm   LV e' medial:    5.22 cm/s LV PW:         0.90 cm   LV E/e' medial:  16.0 LV IVS:        1.10 cm   LV e' lateral:   5.82 cm/s LVOT diam:     2.00 cm   LV E/e' lateral: 14.3 LV SV:         81 LV SV Index:   52 LVOT Area:     3.14 cm  RIGHT VENTRICLE RV S prime:     14.50 cm/s TAPSE (M-mode): 1.8 cm LEFT ATRIUM             Index        RIGHT ATRIUM          Index LA diam:        3.60 cm 2.30 cm/m   RA Area:     9.20 cm LA Vol (A2C):   38.8 ml 24.75 ml/m  RA Volume:   16.30 ml 10.40 ml/m LA Vol (A4C):   20.7 ml 13.21 ml/m LA Biplane Vol: 29.5 ml 18.82 ml/m  AORTIC VALVE AV Area (Vmax):    2.34 cm AV Area (Vmean):   2.48 cm AV Area (VTI):  2.46 cm AV Vmax:           177.50 cm/s AV Vmean:          114.000 cm/s AV VTI:            0.329 m AV Peak Grad:      12.6 mmHg AV Mean Grad:      7.0 mmHg LVOT Vmax:         132.00 cm/s LVOT Vmean:        89.900 cm/s LVOT VTI:           0.257 m LVOT/AV VTI ratio: 0.78  AORTA Ao Asc diam: 2.90 cm MITRAL VALVE                TRICUSPID VALVE MV Area (PHT): 3.50 cm     TR Peak grad:   26.8 mmHg MV Decel Time: 217 msec     TR Vmax:        259.00 cm/s MV E velocity: 83.25 cm/s MV A velocity: 135.50 cm/s  SHUNTS MV E/A ratio:  0.61         Systemic VTI:  0.26 m                             Systemic Diam: 2.00 cm Rudean Haskell MD Electronically signed by Rudean Haskell MD Signature Date/Time: 08/14/2021/1:53:34 PM    Final    ECHOCARDIOGRAM LIMITED  Result Date: 08/13/2021    ECHOCARDIOGRAM LIMITED REPORT   Patient Name:   [facility] Date of Exam: 08/13/2021 Medical Rec #:  [id number]     Height:       59.0 in Accession #:    [id number]    Weight:       138.9 lb Date of Birth:  Jan 16, 1941      BSA:          1.580 m Patient Age:    36 years      BP:           190/59 mmHg Patient Gender: F             HR:           69 bpm. Exam Location:  Inpatient Procedure: Limited Echo, Cardiac Doppler and Color Doppler Indications:    Aortic valve disorder I35.9  History:        Patient has prior history of Echocardiogram examinations, most                 recent 01/03/2021. Signs/Symptoms:Murmur; Risk                 Factors:Hypertension. Chronic kidney disease.                 Aortic Valve: 26 mm CoreValve-Evolut Pro prosthetic, stented                 (TAVR) valve is present in the aortic position. Procedure Date:                 08/13/2021.  Sonographer:    Darlina Sicilian RDCS Referring Phys: [place]  1. Interventional echo for TAVR placement. Prior to the procedure, The aortic valve was calcified. Aortic valve regurgitation is not visualized. Mean gradient of 37 mm Hg Peak gradient 60 mm Hg, DVI 0.27. AVA 0.55 cm2.  2. Post procedure, there is a 26 mm CoreValve-Evolut Pro prosthetic (TAVR) valve present in the aortic position.  Procedure Date: 08/13/2021. Mean gradient 5 mm Hg, Peak gradient 11 mg Hg. There is  trace to mild PVL best seen in the [facility] view. DVI 0.85. EOa 3.46 cm2. Successful TAVR placement.  3. Left ventricular ejection fraction, by estimation, is 60 to 65%. The left ventricle has normal function. The left ventricle has no regional wall motion abnormalities.  4. The mitral valve is normal in structure.  5. Right ventricular systolic function is normal. The right ventricular size is normal. FINDINGS  Left Ventricle: Left ventricular ejection fraction, by estimation, is 60 to 65%. The left ventricle has normal function. The left ventricle has no regional wall motion abnormalities. Right Ventricle: The right ventricular size is normal. Right ventricular systolic function is normal. Mitral Valve: The mitral valve is normal in structure. Aortic Valve: The aortic valve is calcified. Aortic valve regurgitation is not visualized. Aortic valve mean gradient measures 28.2 mmHg. Aortic valve peak gradient measures 44.6 mmHg. Aortic valve area, by VTI measures 1.97 cm. There is a 26 mm CoreValve-Evolut Pro prosthetic, stented (TAVR) valve present in the aortic position. Procedure Date: 08/13/2021. LEFT VENTRICLE PLAX 2D LVOT diam:     2.30 cm LV SV:         148 LV SV Index:   94 LVOT Area:     4.15 cm  AORTIC VALVE AV Area (Vmax):    1.77 cm AV Area (Vmean):   1.56 cm AV Area (VTI):     1.97 cm AV Vmax:           333.75 cm/s AV Vmean:          238.500 cm/s AV VTI:            0.751 m AV Peak Grad:      44.6 mmHg AV Mean Grad:      28.2 mmHg LVOT Vmax:         142.00 cm/s LVOT Vmean:        89.650 cm/s LVOT VTI:          0.356 m LVOT/AV VTI ratio: 0.47  SHUNTS Systemic VTI:  0.36 m Systemic Diam: 2.30 cm Rudean Haskell MD Electronically signed by Rudean Haskell MD Signature Date/Time: 08/13/2021/2:44:19 PM    Final    Structural Heart Procedure  Result Date: 08/13/2021 Successful placement oa 26 mm Medtronic Evolut Pro THV, right transfemoral approach. See full note in progress notes section for  details     Assessment and Plan:   80 year old post TAVR on 08/13/2021 with chest discomfort, small left pleural effusion, leukocytosis 27,000, EKG compatible with acute pericarditis.  Acute pericarditis - We will treat with NSAIDs, ibuprofen 400 mg twice a day as well as colchicine 0.6 mg once a day.  We will be careful with chronic kidney disease stage IIIa, creatinine has been 1.2 to up from 1.0 previously.  Currently 1.34.  She did just get a CT scan again. -No adverse arrhythmias noted.  PACs on telemetry. -No pericardial effusion noted on CT scan. -No dissection  Elevated troponin - 173, mildly elevated most likely secondary acute pericarditis.  Leukocytosis - 27,000 up from 14,000.  This does seem out of proportion to postop/post TAVR findings.  Possibly secondary to acute pericardial inflammation.    Chronic kidney disease stage IIIa -Hold Hyzaar or losartan 100 hydrochlorothiazide 12.5.  Hyponatremia 128 also noted.  I asked her if she wanted to stay for observation or if she felt comfortable going home via the translator and she opted to go  home.  This seems reasonable.  Appears comfortable currently.  We will have her follow-up closely in clinic.  We will check basic metabolic profile in clinic as well.  She clearly knows to return to the emergency department if symptoms worsen or become more worrisome.  Risk Assessment/Risk Scores:  And i  For questions or updates, please contact [place] Please consult [email or web address] for contact info under    Signed, Candee Furbish, MD  08/16/2021 5:26 PM

## 2021-08-19 NOTE — Progress Notes (Deleted)
[place]                                     Cardiology Office Note:    Date:  08/19/2021   ID:  Krystal Maddox, DOB 1940-10-11, MRN [id number]  PCP:  Prince Solian, MD  [facility] Cardiologist:  Mertie Moores, MD  [address] Electrophysiologist:  None   Referring MD: Prince Solian, MD   No chief complaint on file. ***  History of Present Illness:    Krystal Maddox is a 80 y.o. female with a hx of ***   Discharge her from emergency department.  CT scan was negative for dissection.  EKG compatible with acute pericarditis.  Held her Hyzaar.  Gentle NSAIDs with ibuprofen 400 mg twice a day and colchicine 0.6 mg once a day.  She was feeling better at the end of ER visit.  Recommend close clinic follow-up with basic metabolic profile.  Creatinine was 1.38.  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 Angelena Form and Dr Cyndia Bent.   Severe aortic stenosis    Superficial vein thrombosis 05/21/2021   R arm   Vitamin D deficiency     Past Surgical History:  Procedure Laterality Date   EYE SURGERY Left 2022   cataract   INTRAOPERATIVE TRANSTHORACIC ECHOCARDIOGRAM N/A 08/13/2021   Procedure: INTRAOPERATIVE TRANSTHORACIC ECHOCARDIOGRAM;  Surgeon: Burnell Blanks, MD;  Location: [place] [facility];  Service: Open Heart Surgery;  Laterality: N/A;   KNEE SURGERY Left    RIGHT/LEFT HEART CATH AND CORONARY ANGIOGRAPHY N/A 05/17/2021   Procedure: RIGHT/LEFT HEART CATH AND CORONARY ANGIOGRAPHY;  Surgeon: Burnell Blanks, MD;  Location: [place] [facility];  Service:  Cardiovascular;  Laterality: N/A;   TRANSCATHETER AORTIC VALVE REPLACEMENT, TRANSFEMORAL N/A 08/13/2021   Procedure: TRANSCATHETER AORTIC VALVE REPLACEMENT, TRANSFEMORAL;  Surgeon: Burnell Blanks, MD;  Location: [place] [facility];  Service: Open Heart Surgery;  Laterality: N/A;    Current Medications: No outpatient medications have been marked as taking for the 08/22/21 encounter (Appointment) with Eileen Stanford, PA-C.     Allergies:   Tylenol [acetaminophen]   Social History   Socioeconomic History   Marital status: Widowed    Spouse name: Not on file   Number of children: 5   Years of education: Not on file   Highest education level: Not on file  Occupational History   Not on file  Tobacco Use   Smoking status: Never   Smokeless tobacco: Never  Vaping Use   Vaping Use: Never used  Substance and Sexual Activity   Alcohol use: Never   Drug use: Never   Sexual activity: Not on file  Other Topics Concern   Not on file  Social History Narrative   Not on file   Social Determinants of Health   Financial Resource Strain: Not on file  Food Insecurity: Not on file  Transportation Needs: Not on file  Physical Activity: Not on  file  Stress: Not on file  Social Connections: Not on file     Family History: The patient's ***family history includes Hypertension in her mother.  ROS:   Please see the history of present illness.    All other systems reviewed and are negative.  EKGs/Labs/Other Studies Reviewed:    The following studies were reviewed today: ***  EKG:  EKG is *** ordered today.  The ekg ordered today demonstrates ***  Recent Labs: 01/10/2021: TSH 2.210 08/12/2021: B Natriuretic Peptide 56.4 08/14/2021: Magnesium 1.7 08/16/2021: ALT 16; BUN 17; Creatinine, Ser 1.34; Hemoglobin 8.1; Platelets 314; Potassium 3.4; Sodium 128  Recent Lipid Panel No results found for: CHOL, TRIG, HDL, CHOLHDL, VLDL, LDLCALC, LDLDIRECT   Risk  Assessment/Calculations:   {Does this patient have ATRIAL FIBRILLATION?:[phone]}   Physical Exam:    VS:  There were no vitals taken for this visit.    Wt Readings from Last 3 Encounters:  08/14/21 136 lb 6.4 oz (61.9 kg)  08/12/21 138 lb 14.4 oz (63 kg)  08/07/21 141 lb (64 kg)     GEN: *** Well nourished, well developed in no acute distress HEENT: Normal NECK: No JVD LYMPHATICS: No lymphadenopathy CARDIAC: ***RRR, no murmurs, rubs, gallops RESPIRATORY:  Clear to auscultation without rales, wheezing or rhonchi  ABDOMEN: Soft, non-tender, non-distended MUSCULOSKELETAL:  No edema; No deformity  SKIN: Warm and dry NEUROLOGIC:  Alert and oriented x 3 PSYCHIATRIC:  Normal affect   ASSESSMENT:    No diagnosis found. PLAN:    In order of problems listed above:        {Are you ordering a CV Procedure (e.g. stress test, cath, DCCV, TEE, etc)?   Press F2        :[id number]}    Medication Adjustments/Labs and Tests Ordered: Current medicines are reviewed at length with the patient today.  Concerns regarding medicines are outlined above.  No orders of the defined types were placed in this encounter.  No orders of the defined types were placed in this encounter.   There are no Patient Instructions on file for this visit.   Signed, Angelena Form, PA-C  08/19/2021 9:06 AM    [facility]

## 2021-08-20 NOTE — Progress Notes (Signed)
Cardiology Office Note:    Date:  08/21/2021   ID:  Krystal Maddox, DOB [birth year], MRN [id number]  PCP:  Prince Solian, MD  Cardiologist:  Tera Pellicane   Electrophysiologist:  None   Referring MD: Prince Solian, MD   Chief Complaint  Patient presents with   Aortic Stenosis     Previous notes:    Krystal Maddox is a 80 y.o. female with a hx of hypertension, heart murmur, shortness of breath, stage III chronic kidney disease.  She was recently found to have aortic stenosis.  We are asked to evaluate her for further evaluation and management of her aortic stenosis.  Seen with interpreter, Raynaldo Opitz   She was previously seen by Dr. Elsworth Soho.  More recently she has been [facility]. Echocardiogram from November 18, 2019 reveals normal left ventricular systolic function.  She has grade 1 diastolic dysfunction.  She has severe calcification and thickening of the aortic valve.  There is a mean aortic valve gradient of 36 mmHg with a peak gradient of 71.7 mmHg.  She has had a heart murmur for years .  Coughing recently . Has cp when she cough.  Coughs only rarely   No knowledge of rheumatic fever as a child   Has had both covid vaccines.   Has DOE , no cp with exertion  Unable to walk very far due to DOE .    July 24, 2020: Krystal Maddox is a 80 yo [place] .  She  is seen today for follow up of her AS, dyspnea, CKD. She has moderate - severe AS .  Echo from March, 2021 shows normal LV function.  Grade 1 DD.  Peak AS gradient of 71 with  Mean AS gradient of 36.   Is not very active Still has some shortness of breath but says she is feeling better.   Jan 10, 2021:  Krystal Maddox is a 80 yo [place].  Seen with Interpreter, Diah Siu.   She has a hx of aortic stenosis Recent echo shows worsening AV gradient Has had a syncopal episode Some DOE No CP   Dec. 14, 2022 Seen with interpreter, Krystal Muse  MS. Musich is seen today for follow up of her TAVR TAVR - Dec.  6 ER visit - Dec. 9 for pericarditis - WBC of 27,000  ECG shows some persistent ST elevation - improved since Dec. 9.   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 Angelena Form and Dr Cyndia Bent.   Severe aortic stenosis    Superficial vein thrombosis 05/21/2021   R arm   Vitamin D deficiency     Past Surgical History:  Procedure Laterality Date   EYE SURGERY Left 2022   cataract   INTRAOPERATIVE TRANSTHORACIC ECHOCARDIOGRAM N/A 08/13/2021   Procedure: INTRAOPERATIVE TRANSTHORACIC ECHOCARDIOGRAM;  Surgeon: Burnell Blanks, MD;  Location: [place] [facility];  Service: Open Heart Surgery;  Laterality: N/A;   KNEE SURGERY Left    RIGHT/LEFT  HEART CATH AND CORONARY ANGIOGRAPHY N/A 05/17/2021   Procedure: RIGHT/LEFT HEART CATH AND CORONARY ANGIOGRAPHY;  Surgeon: Burnell Blanks, MD;  Location: [place] [facility];  Service: Cardiovascular;  Laterality: N/A;   TRANSCATHETER AORTIC VALVE REPLACEMENT, TRANSFEMORAL N/A 08/13/2021   Procedure: TRANSCATHETER AORTIC VALVE REPLACEMENT, TRANSFEMORAL;  Surgeon: Burnell Blanks, MD;  Location: [place] [facility];  Service: Open Heart Surgery;  Laterality: N/A;    Current Medications: Current Meds  Medication Sig   amLODipine (NORVASC) 10 MG tablet Take 10 mg by mouth daily.    aspirin EC 81 MG tablet Take 81 mg by mouth daily.   Calcium Carb-Cholecalciferol 600-20 MG-MCG TABS Take 1 tablet by mouth in the morning and at bedtime.   colchicine 0.6 MG tablet Take 1 tablet (0.6 mg total) by mouth  daily.   ferrous sulfate 325 (65 FE) MG tablet Take 325 mg by mouth daily.   ibuprofen (ADVIL) 400 MG tablet Take 1 tablet (400 mg total) by mouth in the morning and at bedtime.   losartan-hydrochlorothiazide (HYZAAR) 100-12.5 MG tablet Take 1 tablet by mouth daily.   Menthol, Topical Analgesic, (BIOFREEZE EX) Apply 1 application topically 4 (four) times daily as needed (joint/muscle pain). 3.5 % Gel   Menthol-Methyl Salicylate (SALONPAS PAIN RELIEF PATCH EX) Place 1 patch onto the skin every 8 (eight) hours as needed (pain).   Multiple Vitamins-Minerals (PRESERVISION AREDS 2 PO) Take 1 tablet by mouth in the morning and at bedtime.   omeprazole (PRILOSEC) 20 MG capsule Take 1 capsule (20 mg total) by mouth daily. Take for one month only.   Polyethyl Glyc-Propyl Glyc PF (SYSTANE HYDRATION PF) 0.4-0.3 % SOLN Place 1 drop into both eyes 4 (four) times daily as needed (dry eyes).   pravastatin (PRAVACHOL) 80 MG tablet Take 80 mg by mouth daily.   prednisoLONE acetate (PRED FORTE) 1 % ophthalmic suspension Place 1 drop into the left eye 4 (four) times daily.   psyllium (METAMUCIL) 0.52 g capsule Take 1 capsule (0.52 g total) by mouth at bedtime.   senna-docusate (SENOKOT-S) 8.6-50 MG tablet Take 2 tablets by mouth at bedtime.   Skin Protectants, Misc. (EUCERIN) cream Apply 1 application topically in the morning and at bedtime.   tolnaftate (TINACTIN) 1 % spray Apply 1 spray topically daily. Applied to feet     Allergies:   Tylenol [acetaminophen]   Social History   Socioeconomic History   Marital status: Widowed    Spouse name: Not on file   Number of children: 5   Years of education: Not on file   Highest education level: Not on file  Occupational History   Not on file  Tobacco Use   Smoking status: Never   Smokeless tobacco: Never  Vaping Use   Vaping Use: Never used  Substance and Sexual Activity   Alcohol use: Never   Drug use: Never   Sexual activity: Not on file  Other Topics  Concern   Not on file  Social History Narrative   Not on file   Social Determinants of Health   Financial Resource Strain: Not on file  Food Insecurity: Not on file  Transportation Needs: Not on file  Physical Activity: Not on file  Stress: Not on file  Social Connections: Not on file     Family History: The patient's family history includes Hypertension in her mother.  Family is in [place] .  She is a  Leisure centre manager   No known medical histyory.  Family did not go to the doctor   ROS:   Please see the history of present illness.     All other systems reviewed and are negative.  EKGs/Labs/Other Studies Reviewed:    The following studies were reviewed today:   EKG:    August 21, 2021: Normal sinus rhythm at 67.  She has some persistent ST segment elevation in leads II,  III, aVF, V5 and V6.    Recent Labs: 01/10/2021: TSH 2.210 08/12/2021: B Natriuretic Peptide 56.4 08/14/2021: Magnesium 1.7 08/16/2021: ALT 16; BUN 17; Creatinine, Ser 1.34; Hemoglobin 8.1; Platelets 314; Potassium 3.4; Sodium 128  Recent Lipid Panel No results found for: CHOL, TRIG, HDL, CHOLHDL, VLDL, LDLCALC, LDLDIRECT  Physical Exam:     Physical Exam: Blood pressure (!) 140/52, pulse 67, height 4\' 11"  (1.499 m), weight 139 lb 6.4 oz (63.2 kg), SpO2 98 %.  GEN:   elderly female,  NAD  HEENT: Normal NECK: No JVD; No carotid bruits LYMPHATICS: No lymphadenopathy CARDIAC: RRR  , no significant murmur , no pericardial friction rub   RESPIRATORY:  Clear to auscultation without rales, wheezing or rhonchi  ABDOMEN: Soft, non-tender, non-distended MUSCULOSKELETAL:  No edema; No deformity  SKIN: Warm and dry NEUROLOGIC:  Alert and oriented x 3    ASSESSMENT:    1. S/P TAVR (transcatheter aortic valve replacement)     PLAN:       Aortic stenosis: She is doing well status post TAVR.  She had some postoperative pericarditis.  She has been treated with nonsteroidal anti-inflammatory agent as well  as colchicine.  She seems to be feeling better.  Her EKG is improved.  We will continue these agents until she sees Nell Range, [place] in 1 month.    Medication Adjustments/Labs and Tests Ordered: Current medicines are reviewed at length with the patient today.  Concerns regarding medicines are outlined above.  Orders Placed This Encounter  Procedures   EKG 12-Lead    Meds ordered this encounter  Medications   omeprazole (PRILOSEC) 20 MG capsule    Sig: Take 1 capsule (20 mg total) by mouth daily. Take for one month only.    Dispense:  30 capsule    Refill:  0   psyllium (METAMUCIL) 0.52 g capsule    Sig: Take 1 capsule (0.52 g total) by mouth at bedtime.    Dispense:  30 capsule    Refill:  0    Use sugar free metamucil       Patient Instructions  Medication Instructions:   START TAKING OMEPRAZOLE 20 MG BY MOUTH DAILY FOR ONE MONTH ONLY  PLEASE GET OVER-THE-COUNTER METAMUCIL (SUGAR FREE) AND START TAKING THIS DAILY AT BEDTIME FOR CONSTIPATION  *If you need a refill on your cardiac medications before your next appointment, please call your pharmacy*   Follow-Up:  AS SCHEDULED WITH KATIE THOMPSON PA-C ON 09/20/21 AT 11:30 AM    Signed, Mertie Moores, MD  08/21/2021 6:12 PM    [facility]

## 2021-08-21 ENCOUNTER — Encounter: Payer: Self-pay | Admitting: Cardiovascular Disease

## 2021-08-21 ENCOUNTER — Ambulatory Visit (INDEPENDENT_AMBULATORY_CARE_PROVIDER_SITE_OTHER): Payer: Medicare (Managed Care) | Admitting: Cardiovascular Disease

## 2021-08-21 ENCOUNTER — Other Ambulatory Visit: Payer: Self-pay

## 2021-08-21 VITALS — BP 140/52 | HR 67 | Ht 59.0 in | Wt 139.4 lb

## 2021-08-21 DIAGNOSIS — I350 Nonrheumatic aortic (valve) stenosis: Secondary | ICD-10-CM | POA: Diagnosis not present

## 2021-08-21 DIAGNOSIS — Z952 Presence of prosthetic heart valve: Secondary | ICD-10-CM

## 2021-08-21 MED ORDER — METAMUCIL 0.52 G PO CAPS: 0.5200 g | ORAL_CAPSULE | Freq: Every day | ORAL | 0 refills | Status: DC

## 2021-08-21 MED ORDER — OMEPRAZOLE 20 MG PO CPDR
20.0000 mg | DELAYED_RELEASE_CAPSULE | Freq: Every day | ORAL | 0 refills | Status: DC
Start: 2021-08-21 — End: 2022-09-12

## 2021-08-21 NOTE — Progress Notes (Signed)
We order these on all our TAVRs. It was approved by upper managament

## 2021-08-21 NOTE — Patient Instructions (Addendum)
Medication Instructions:   START TAKING OMEPRAZOLE 20 MG BY MOUTH DAILY FOR ONE MONTH ONLY  PLEASE GET OVER-THE-COUNTER METAMUCIL (SUGAR FREE) AND START TAKING THIS DAILY AT BEDTIME FOR CONSTIPATION  *If you need a refill on your cardiac medications before your next appointment, please call your pharmacy*   Follow-Up:  AS SCHEDULED WITH KATIE THOMPSON PA-C ON 09/20/21 AT 11:30 AM

## 2021-08-22 ENCOUNTER — Ambulatory Visit: Payer: Medicare (Managed Care) | Admitting: Physician Assistant

## 2021-09-09 ENCOUNTER — Other Ambulatory Visit: Payer: Self-pay

## 2021-09-09 ENCOUNTER — Encounter (HOSPITAL_COMMUNITY): Payer: Self-pay | Admitting: Emergency Medicine

## 2021-09-09 ENCOUNTER — Inpatient Hospital Stay (HOSPITAL_COMMUNITY)
Admission: EM | Admit: 2021-09-09 | Discharge: 2021-09-12 | DRG: 641 | Disposition: A | Discharge status: Home Health | Payer: Medicare (Managed Care) | Attending: Student in an Organized Health Care Education/Training Program | Admitting: Student in an Organized Health Care Education/Training Program

## 2021-09-09 DIAGNOSIS — I3139 Other pericardial effusion (noninflammatory): Secondary | ICD-10-CM | POA: Diagnosis present

## 2021-09-09 DIAGNOSIS — I739 Peripheral vascular disease, unspecified: Secondary | ICD-10-CM | POA: Diagnosis present

## 2021-09-09 DIAGNOSIS — Z6827 Body mass index (BMI) 27.0-27.9, adult: Secondary | ICD-10-CM

## 2021-09-09 DIAGNOSIS — I129 Hypertensive chronic kidney disease with stage 1 through stage 4 chronic kidney disease, or unspecified chronic kidney disease: Secondary | ICD-10-CM | POA: Diagnosis present

## 2021-09-09 DIAGNOSIS — K5900 Constipation, unspecified: Secondary | ICD-10-CM | POA: Diagnosis present

## 2021-09-09 DIAGNOSIS — R55 Syncope and collapse: Secondary | ICD-10-CM | POA: Diagnosis present

## 2021-09-09 DIAGNOSIS — Z79899 Other long term (current) drug therapy: Secondary | ICD-10-CM

## 2021-09-09 DIAGNOSIS — N179 Acute kidney failure, unspecified: Secondary | ICD-10-CM

## 2021-09-09 DIAGNOSIS — K2970 Gastritis, unspecified, without bleeding: Secondary | ICD-10-CM | POA: Diagnosis present

## 2021-09-09 DIAGNOSIS — E871 Hypo-osmolality and hyponatremia: Secondary | ICD-10-CM | POA: Diagnosis not present

## 2021-09-09 DIAGNOSIS — I10 Essential (primary) hypertension: Secondary | ICD-10-CM | POA: Diagnosis present

## 2021-09-09 DIAGNOSIS — I350 Nonrheumatic aortic (valve) stenosis: Secondary | ICD-10-CM

## 2021-09-09 DIAGNOSIS — Z952 Presence of prosthetic heart valve: Secondary | ICD-10-CM

## 2021-09-09 DIAGNOSIS — I319 Disease of pericardium, unspecified: Secondary | ICD-10-CM | POA: Diagnosis present

## 2021-09-09 DIAGNOSIS — Z20822 Contact with and (suspected) exposure to covid-19: Secondary | ICD-10-CM | POA: Diagnosis present

## 2021-09-09 DIAGNOSIS — D509 Iron deficiency anemia, unspecified: Secondary | ICD-10-CM | POA: Diagnosis present

## 2021-09-09 DIAGNOSIS — N1831 Chronic kidney disease, stage 3a: Secondary | ICD-10-CM | POA: Diagnosis present

## 2021-09-09 DIAGNOSIS — R630 Anorexia: Secondary | ICD-10-CM | POA: Diagnosis present

## 2021-09-09 DIAGNOSIS — Z7982 Long term (current) use of aspirin: Secondary | ICD-10-CM

## 2021-09-09 LAB — COMPREHENSIVE METABOLIC PANEL
ALT: 12 U/L (ref 0–44)
AST: 20 U/L (ref 15–41)
Albumin: 3.3 g/dL — ABNORMAL LOW (ref 3.5–5.0)
Alkaline Phosphatase: 76 U/L (ref 38–126)
Anion gap: 14 (ref 5–15)
BUN: 34 mg/dL — ABNORMAL HIGH (ref 8–23)
CO2: 22 mmol/L (ref 22–32)
Calcium: 9 mg/dL (ref 8.9–10.3)
Chloride: 78 mmol/L — ABNORMAL LOW (ref 98–111)
Creatinine, Ser: 1.1 mg/dL — ABNORMAL HIGH (ref 0.44–1.00)
GFR, Estimated: 50 mL/min — ABNORMAL LOW (ref >60)
Glucose, Bld: 120 mg/dL — ABNORMAL HIGH (ref 70–99)
Potassium: 3.8 mmol/L (ref 3.5–5.1)
Sodium: 114 mmol/L — CL (ref 135–145)
Total Bilirubin: 0.7 mg/dL (ref 0.3–1.2)
Total Protein: 7.8 g/dL (ref 6.5–8.1)

## 2021-09-09 LAB — URINALYSIS, ROUTINE W REFLEX MICROSCOPIC
Bilirubin Urine: NEGATIVE
Glucose, UA: NEGATIVE mg/dL
Hgb urine dipstick: NEGATIVE
Ketones, ur: NEGATIVE mg/dL
Leukocytes,Ua: NEGATIVE
Nitrite: NEGATIVE
Protein, ur: NEGATIVE mg/dL
Specific Gravity, Urine: 1.003 — ABNORMAL LOW (ref 1.005–1.030)
pH: 6 (ref 5.0–8.0)

## 2021-09-09 LAB — CBC
HCT: 27 % — ABNORMAL LOW (ref 36.0–46.0)
Hemoglobin: 8.9 g/dL — ABNORMAL LOW (ref 12.0–15.0)
MCH: 25.6 pg — ABNORMAL LOW (ref 26.0–34.0)
MCHC: 33 g/dL (ref 30.0–36.0)
MCV: 77.6 fL — ABNORMAL LOW (ref 80.0–100.0)
Platelets: 244 10*3/uL (ref 150–400)
RBC: 3.48 MIL/uL — ABNORMAL LOW (ref 3.87–5.11)
RDW: 13 % (ref 11.5–15.5)
WBC: 11.9 10*3/uL — ABNORMAL HIGH (ref 4.0–10.5)
nRBC: 0 % (ref 0.0–0.2)

## 2021-09-09 LAB — LIPASE, BLOOD: Lipase: 56 U/L — ABNORMAL HIGH (ref 11–51)

## 2021-09-09 NOTE — ED Provider Notes (Signed)
Emergency Medicine Provider Triage Evaluation Note  Krystal Maddox , a 81 y.o. female  was evaluated in triage.  Pt complains of epigastric abdominal pain, anorexia, cough, and generalized weakness that started earlier today.  No sick contacts.  No fever or chills.  No urinary symptoms.  No chest pain or shortness of breath.  Review of Systems  Positive:  Negative: See above   Physical Exam  There were no vitals taken for this visit. Gen:   Awake, no distress   Resp:  Normal effort  MSK:   Moves extremities without difficulty  Other:  Abdomen is nontender to palpation  Medical Decision Making  Medically screening exam initiated at 5:03 PM.  Appropriate orders placed.  Hingaeh Cease was informed that the remainder of the evaluation will be completed by another provider, this initial triage assessment does not replace that evaluation, and the importance of remaining in the ED until their evaluation is complete.     Hendricks Limes, PA-C 09/09/21 1705    Jeanell Sparrow, DO 09/10/21 0013

## 2021-09-09 NOTE — ED Triage Notes (Signed)
Pt c/o abdominal discomfort, no appetite, cough, and generalized weakness that started today.

## 2021-09-10 ENCOUNTER — Emergency Department (HOSPITAL_COMMUNITY): Payer: Medicare (Managed Care)

## 2021-09-10 ENCOUNTER — Encounter (HOSPITAL_COMMUNITY): Payer: Self-pay | Admitting: Student in an Organized Health Care Education/Training Program

## 2021-09-10 DIAGNOSIS — R55 Syncope and collapse: Secondary | ICD-10-CM | POA: Diagnosis present

## 2021-09-10 DIAGNOSIS — N1831 Chronic kidney disease, stage 3a: Secondary | ICD-10-CM | POA: Diagnosis present

## 2021-09-10 DIAGNOSIS — I350 Nonrheumatic aortic (valve) stenosis: Secondary | ICD-10-CM | POA: Diagnosis present

## 2021-09-10 DIAGNOSIS — I129 Hypertensive chronic kidney disease with stage 1 through stage 4 chronic kidney disease, or unspecified chronic kidney disease: Secondary | ICD-10-CM | POA: Diagnosis present

## 2021-09-10 DIAGNOSIS — Z79899 Other long term (current) drug therapy: Secondary | ICD-10-CM | POA: Diagnosis not present

## 2021-09-10 DIAGNOSIS — E871 Hypo-osmolality and hyponatremia: Principal | ICD-10-CM

## 2021-09-10 DIAGNOSIS — R630 Anorexia: Secondary | ICD-10-CM | POA: Diagnosis present

## 2021-09-10 DIAGNOSIS — Z6827 Body mass index (BMI) 27.0-27.9, adult: Secondary | ICD-10-CM | POA: Diagnosis not present

## 2021-09-10 DIAGNOSIS — I739 Peripheral vascular disease, unspecified: Secondary | ICD-10-CM | POA: Diagnosis present

## 2021-09-10 DIAGNOSIS — Z20822 Contact with and (suspected) exposure to covid-19: Secondary | ICD-10-CM | POA: Diagnosis present

## 2021-09-10 DIAGNOSIS — K5900 Constipation, unspecified: Secondary | ICD-10-CM | POA: Diagnosis present

## 2021-09-10 DIAGNOSIS — I10 Essential (primary) hypertension: Secondary | ICD-10-CM | POA: Diagnosis present

## 2021-09-10 DIAGNOSIS — K2970 Gastritis, unspecified, without bleeding: Secondary | ICD-10-CM | POA: Diagnosis present

## 2021-09-10 DIAGNOSIS — D509 Iron deficiency anemia, unspecified: Secondary | ICD-10-CM | POA: Diagnosis present

## 2021-09-10 DIAGNOSIS — I3139 Other pericardial effusion (noninflammatory): Secondary | ICD-10-CM | POA: Diagnosis present

## 2021-09-10 DIAGNOSIS — Z952 Presence of prosthetic heart valve: Secondary | ICD-10-CM | POA: Diagnosis not present

## 2021-09-10 DIAGNOSIS — Z7982 Long term (current) use of aspirin: Secondary | ICD-10-CM | POA: Diagnosis not present

## 2021-09-10 DIAGNOSIS — I319 Disease of pericardium, unspecified: Secondary | ICD-10-CM | POA: Diagnosis present

## 2021-09-10 LAB — BASIC METABOLIC PANEL
Anion gap: 11 (ref 5–15)
Anion gap: 9 (ref 5–15)
Anion gap: 11 (ref 5–15)
BUN: 32 mg/dL — ABNORMAL HIGH (ref 8–23)
BUN: 29 mg/dL — ABNORMAL HIGH (ref 8–23)
BUN: 37 mg/dL — ABNORMAL HIGH (ref 8–23)
CO2: 22 mmol/L (ref 22–32)
CO2: 26 mmol/L (ref 22–32)
CO2: 21 mmol/L — ABNORMAL LOW (ref 22–32)
Calcium: 8.5 mg/dL — ABNORMAL LOW (ref 8.9–10.3)
Calcium: 8.8 mg/dL — ABNORMAL LOW (ref 8.9–10.3)
Calcium: 8.3 mg/dL — ABNORMAL LOW (ref 8.9–10.3)
Chloride: 82 mmol/L — ABNORMAL LOW (ref 98–111)
Chloride: 85 mmol/L — ABNORMAL LOW (ref 98–111)
Chloride: 89 mmol/L — ABNORMAL LOW (ref 98–111)
Creatinine, Ser: 1.05 mg/dL — ABNORMAL HIGH (ref 0.44–1.00)
Creatinine, Ser: 1.11 mg/dL — ABNORMAL HIGH (ref 0.44–1.00)
Creatinine, Ser: 1.39 mg/dL — ABNORMAL HIGH (ref 0.44–1.00)
GFR, Estimated: 53 mL/min — ABNORMAL LOW (ref >60)
GFR, Estimated: 50 mL/min — ABNORMAL LOW (ref >60)
GFR, Estimated: 38 mL/min — ABNORMAL LOW (ref >60)
Glucose, Bld: 128 mg/dL — ABNORMAL HIGH (ref 70–99)
Glucose, Bld: 133 mg/dL — ABNORMAL HIGH (ref 70–99)
Glucose, Bld: 149 mg/dL — ABNORMAL HIGH (ref 70–99)
Potassium: 4.1 mmol/L (ref 3.5–5.1)
Potassium: 4 mmol/L (ref 3.5–5.1)
Potassium: 4.4 mmol/L (ref 3.5–5.1)
Sodium: 115 mmol/L — CL (ref 135–145)
Sodium: 120 mmol/L — ABNORMAL LOW (ref 135–145)
Sodium: 121 mmol/L — ABNORMAL LOW (ref 135–145)

## 2021-09-10 LAB — RESP PANEL BY RT-PCR (FLU A&B, COVID) ARPGX2
Influenza A by PCR: NEGATIVE
Influenza B by PCR: NEGATIVE
SARS Coronavirus 2 by RT PCR: NEGATIVE

## 2021-09-10 LAB — SODIUM, URINE, RANDOM: Sodium, Ur: 17 mmol/L

## 2021-09-10 LAB — OSMOLALITY: Osmolality: 266 mOsm/kg — ABNORMAL LOW (ref 275–295)

## 2021-09-10 LAB — TSH: TSH: 0.667 u[IU]/mL (ref 0.350–4.500)

## 2021-09-10 LAB — OSMOLALITY, URINE: Osmolality, Ur: 272 mOsm/kg — ABNORMAL LOW (ref 300–900)

## 2021-09-10 MED ORDER — ONDANSETRON HCL 4 MG/2ML IJ SOLN: 4.0000 mg | Freq: Four times a day (QID) | INTRAMUSCULAR | Status: DC | PRN

## 2021-09-10 MED ORDER — IOHEXOL 350 MG/ML SOLN
80.0000 mL | Freq: Once | INTRAVENOUS | Status: AC | PRN
  Administered 2021-09-10: 80 mL via INTRAVENOUS

## 2021-09-10 MED ORDER — PANTOPRAZOLE SODIUM 40 MG IV SOLR
40.0000 mg | Freq: Once | INTRAVENOUS | Status: AC
  Administered 2021-09-10: 40 mg via INTRAVENOUS
  Filled 2021-09-10: qty 40

## 2021-09-10 MED ORDER — PREDNISOLONE ACETATE 1 % OP SUSP
1.0000 [drp] | Freq: Four times a day (QID) | OPHTHALMIC | Status: DC
  Administered 2021-09-10 – 2021-09-12 (×6): 1 [drp] via OPHTHALMIC
  Filled 2021-09-10 (×3): qty 5

## 2021-09-10 MED ORDER — POLYETHYLENE GLYCOL 3350 17 G PO PACK
17.0000 g | PACK | Freq: Every day | ORAL | Status: DC
  Administered 2021-09-10: 17 g via ORAL
  Filled 2021-09-10: qty 1

## 2021-09-10 MED ORDER — SODIUM CHLORIDE 0.9 % IV SOLN
Freq: Once | INTRAVENOUS | Status: AC
Start: 2021-09-10 — End: 2021-09-10

## 2021-09-10 MED ORDER — LIDOCAINE VISCOUS HCL 2 % MT SOLN
15.0000 mL | Freq: Once | OROMUCOSAL | Status: AC
  Administered 2021-09-10: 15 mL via ORAL
  Filled 2021-09-10: qty 15

## 2021-09-10 MED ORDER — ONDANSETRON HCL 4 MG/2ML IJ SOLN
4.0000 mg | Freq: Once | INTRAMUSCULAR | Status: AC
  Administered 2021-09-10: 4 mg via INTRAVENOUS
  Filled 2021-09-10: qty 2

## 2021-09-10 MED ORDER — FERROUS SULFATE 325 (65 FE) MG PO TABS
325.0000 mg | ORAL_TABLET | Freq: Every day | ORAL | Status: DC
  Administered 2021-09-11 – 2021-09-12 (×2): 325 mg via ORAL
  Filled 2021-09-10 (×2): qty 1

## 2021-09-10 MED ORDER — LOSARTAN POTASSIUM 50 MG PO TABS
100.0000 mg | ORAL_TABLET | Freq: Every day | ORAL | Status: DC
  Administered 2021-09-10: 100 mg via ORAL
  Filled 2021-09-10: qty 2

## 2021-09-10 MED ORDER — NAPHAZOLINE-GLYCERIN 0.012-0.25 % OP SOLN
1.0000 [drp] | Freq: Four times a day (QID) | OPHTHALMIC | Status: DC | PRN
  Filled 2021-09-10: qty 15

## 2021-09-10 MED ORDER — ACETAMINOPHEN 325 MG PO TABS: 650.0000 mg | ORAL_TABLET | Freq: Four times a day (QID) | ORAL | Status: DC | PRN

## 2021-09-10 MED ORDER — SODIUM CHLORIDE 0.9% FLUSH
3.0000 mL | Freq: Two times a day (BID) | INTRAVENOUS | Status: DC
  Administered 2021-09-11 (×2): 3 mL via INTRAVENOUS

## 2021-09-10 MED ORDER — ASPIRIN EC 81 MG PO TBEC
81.0000 mg | DELAYED_RELEASE_TABLET | Freq: Every day | ORAL | Status: DC
  Administered 2021-09-10 – 2021-09-12 (×3): 81 mg via ORAL
  Filled 2021-09-10 (×3): qty 1

## 2021-09-10 MED ORDER — ENOXAPARIN SODIUM 40 MG/0.4ML IJ SOSY
40.0000 mg | PREFILLED_SYRINGE | INTRAMUSCULAR | Status: DC
  Administered 2021-09-10 – 2021-09-11 (×2): 40 mg via SUBCUTANEOUS
  Filled 2021-09-10 (×2): qty 0.4

## 2021-09-10 MED ORDER — AMLODIPINE BESYLATE 10 MG PO TABS
10.0000 mg | ORAL_TABLET | Freq: Every day | ORAL | Status: DC
  Administered 2021-09-10 – 2021-09-12 (×3): 10 mg via ORAL
  Filled 2021-09-10 (×2): qty 1
  Filled 2021-09-10: qty 2

## 2021-09-10 MED ORDER — ALUM & MAG HYDROXIDE-SIMETH 200-200-20 MG/5ML PO SUSP
30.0000 mL | Freq: Once | ORAL | Status: AC
  Administered 2021-09-10: 30 mL via ORAL
  Filled 2021-09-10: qty 30

## 2021-09-10 MED ORDER — SENNOSIDES-DOCUSATE SODIUM 8.6-50 MG PO TABS
2.0000 | ORAL_TABLET | Freq: Two times a day (BID) | ORAL | Status: DC
  Administered 2021-09-10: 2 via ORAL
  Filled 2021-09-10 (×2): qty 2

## 2021-09-10 MED ORDER — PANTOPRAZOLE SODIUM 40 MG IV SOLR: 40.0000 mg | INTRAVENOUS | Status: DC

## 2021-09-10 MED ORDER — ACETAMINOPHEN 650 MG RE SUPP: 650.0000 mg | Freq: Four times a day (QID) | RECTAL | Status: DC | PRN

## 2021-09-10 MED ORDER — FENTANYL CITRATE PF 50 MCG/ML IJ SOSY: 50.0000 ug | PREFILLED_SYRINGE | INTRAMUSCULAR | Status: DC | PRN

## 2021-09-10 MED ORDER — SODIUM CHLORIDE 0.45 % IV SOLN: INTRAVENOUS | Status: AC

## 2021-09-10 MED ORDER — ONDANSETRON HCL 4 MG PO TABS: 4.0000 mg | ORAL_TABLET | Freq: Four times a day (QID) | ORAL | Status: DC | PRN

## 2021-09-10 NOTE — ED Provider Notes (Signed)
HPI Patient's care initiated by Dr. Dayna Barker.  He handed off care of patient pending results of her CT scan.  Patient will need admission to medicine. 81 yo female ho tavr early December with pericardial effusion, was on colchicine and ibu, followed by cardiology.   Presented today with persistent nausea vomiting epigastric pain with distension Increased weakness and syncope.   Physical Exam  BP (!) 146/42    Pulse 63    Temp 97.7 F (36.5 C) (Temporal)    Resp 16    Wt 58.2 kg    SpO2 97%   Physical Exam  ED Course/Procedures    75 ns infusing Protonix CT pending Creatinine and hgb stable  Procedures  MDM   Patient presents today with weakness, vomiting, epigastric pain, and hyponatremia at 114 1- weakness/syncope 2- severe hyponatremia- first prior mid December 128 3-nausea and vomiting- ct without acute abnormalities-?gastritis with recent nsaid tx for pericardial effusion post TAVR  Discussed with Dr. Marva Panda Discussed above results She will see for admission     Pattricia Boss, MD 09/10/21 [id number]

## 2021-09-10 NOTE — Progress Notes (Signed)
PT Cancellation Note  Patient Details Name: Krystal Maddox MRN: [id number] DOB: 1940-12-22   Cancelled Treatment:    Reason Eval/Treat Not Completed: Other (comment).  Translation was not available, contacted services and will let staff know when help can be made available. Service is in the process of finding translation.  For interpreter services M-F [phone] call [phone]. Service must be coordinated through [facility]. Service not available through other means. Once service has been coordinated (will be made daily) a phone number will be provided for the hours requested.    Ramond Dial 09/10/2021, 2:40 PM  Mee Hives, PT PhD Acute Rehab Dept. Number: [place] [facility] [place]

## 2021-09-10 NOTE — ED Notes (Signed)
For interpretor services M-F 0830-1700 call [phone]. Service must be coordinated through [facility]. Service not available through other means. Once service has been coordinated (will be made daily) a phone number will be provided for the hours requested.   After hours needs and weekends. Pager number is [phone]

## 2021-09-10 NOTE — ED Provider Notes (Incomplete)
°  [facility] EMERGENCY DEPARTMENT Provider Note   CSN: [id number] Arrival date & time: 09/09/21  1644     History {Add pertinent medical, surgical, social history, OB history to HPI:1} Chief Complaint  Patient presents with   Abdominal Pain    Krystal Maddox is a 81 y.o. female.   Abdominal Pain Pain location:  Epigastric     Home Medications Prior to Admission medications   Not on File      Allergies    Patient has no known allergies.    Review of Systems   Review of Systems  Gastrointestinal:  Positive for abdominal pain.   Physical Exam Updated Vital Signs BP (!) 140/51 (BP Location: Left Arm)    Pulse (!) 59    Temp (!) 97.5 F (36.4 C) (Oral)    Resp 16    SpO2 99%  Physical Exam  ED Results / Procedures / Treatments   Labs (all labs ordered are listed, but only abnormal results are displayed) Labs Reviewed  LIPASE, BLOOD - Abnormal; Notable for the following components:      Result Value   Lipase 56 (*)    All other components within normal limits  COMPREHENSIVE METABOLIC PANEL - Abnormal; Notable for the following components:   Sodium 114 (*)    Chloride 78 (*)    Glucose, Bld 120 (*)    BUN 34 (*)    Creatinine, Ser 1.10 (*)    Albumin 3.3 (*)    GFR, Estimated 50 (*)    All other components within normal limits  CBC - Abnormal; Notable for the following components:   WBC 11.9 (*)    RBC 3.48 (*)    Hemoglobin 8.9 (*)    HCT 27.0 (*)    MCV 77.6 (*)    MCH 25.6 (*)    All other components within normal limits  URINALYSIS, ROUTINE W REFLEX MICROSCOPIC - Abnormal; Notable for the following components:   Color, Urine COLORLESS (*)    Specific Gravity, Urine 1.003 (*)    All other components within normal limits  RESP PANEL BY RT-PCR (FLU A&B, COVID) ARPGX2    EKG None  Radiology No results found.  Procedures Procedures  {Document cardiac monitor, telemetry assessment procedure when appropriate:1}  Medications  Ordered in ED Medications  0.9 %  sodium chloride infusion (has no administration in time range)    ED Course/ Medical Decision Making/ A&P                           Medical Decision Making  ***  {Document critical care time when appropriate:1} {Document review of labs and clinical decision tools ie heart score, Chads2Vasc2 etc:1}  {Document your independent review of radiology images, and any outside records:1} {Document your discussion with family members, caretakers, and with consultants:1} {Document social determinants of health affecting pt's care:1} {Document your decision making why or why not admission, treatments were needed:1} Final Clinical Impression(s) / ED Diagnoses Final diagnoses:  None    Rx / DC Orders ED Discharge Orders     None

## 2021-09-10 NOTE — Progress Notes (Signed)
NEW ADMISSION NOTE New Admission Note:   Arrival Method: stretcher Mental Orientation: A&O X4 Telemetry: none Assessment: Completed Skin: intact bruise R hand, bilateral legs, abdomen IV:  LAC Pain: 0/10 Tubes: NONE Safety Measures: Safety Fall Prevention Plan has been given, discussed and signed Admission: Completed [facility] Orientation: Patient has been orientated to the room, unit and staff.  Family: great niece at bedside  Orders have been reviewed and implemented. Will continue to monitor the patient. Call light has been placed within reach and bed alarm has been activated.   Rayan Ines S Hannelore Bova, RN

## 2021-09-10 NOTE — ED Provider Notes (Signed)
[facility] EMERGENCY DEPARTMENT Provider Note   CSN: [id number] Arrival date & time: 09/09/21  1644     History  Chief Complaint  Patient presents with   Abdominal Pain    Krystal Maddox is a 81 y.o. female.  81 year old female whose other chart will be merged.  Patient apparently had a TAVR to be in December.  Then had pericarditis and pericardial effusion after that.  Had been on colchicine and also had been on ibuprofen.  She presents here with her granddaughter who helps interpret secondary to lack of interpreter.  Patient states that she is having epigastric pain, vomiting for the last few days at the very least.  She is also had weakness and had an episode of syncope today related to the same.  She presents here for further evaluation.  No diarrhea or constipation.  No pain with movement.  Has not been eat as much is normal.  No fevers feels her abdomen is bloated   Abdominal Pain     Home Medications Prior to Admission medications   Not on File      Allergies    Patient has no known allergies.    Review of Systems   Review of Systems  Gastrointestinal:  Positive for abdominal pain.   Physical Exam Updated Vital Signs BP (!) 155/40    Pulse 61    Temp 97.7 F (36.5 C) (Temporal)    Resp 19    SpO2 98%  Physical Exam Vitals and nursing note reviewed.  Constitutional:      Appearance: She is well-developed.  HENT:     Head: Normocephalic and atraumatic.  Cardiovascular:     Rate and Rhythm: Normal rate and regular rhythm.  Pulmonary:     Effort: Pulmonary effort is normal. No respiratory distress.     Breath sounds: No stridor.  Abdominal:     General: There is distension.     Palpations: Abdomen is soft.     Tenderness: There is abdominal tenderness in the epigastric area.  Musculoskeletal:     Cervical back: Normal range of motion.  Neurological:     Mental Status: She is alert.    ED Results / Procedures / Treatments   Labs (all  labs ordered are listed, but only abnormal results are displayed) Labs Reviewed  LIPASE, BLOOD - Abnormal; Notable for the following components:      Result Value   Lipase 56 (*)    All other components within normal limits  COMPREHENSIVE METABOLIC PANEL - Abnormal; Notable for the following components:   Sodium 114 (*)    Chloride 78 (*)    Glucose, Bld 120 (*)    BUN 34 (*)    Creatinine, Ser 1.10 (*)    Albumin 3.3 (*)    GFR, Estimated 50 (*)    All other components within normal limits  CBC - Abnormal; Notable for the following components:   WBC 11.9 (*)    RBC 3.48 (*)    Hemoglobin 8.9 (*)    HCT 27.0 (*)    MCV 77.6 (*)    MCH 25.6 (*)    All other components within normal limits  URINALYSIS, ROUTINE W REFLEX MICROSCOPIC - Abnormal; Notable for the following components:   Color, Urine COLORLESS (*)    Specific Gravity, Urine 1.003 (*)    All other components within normal limits  RESP PANEL BY RT-PCR (FLU A&B, COVID) [id number]    EKG None  Radiology No  results found.  Procedures .Critical Care Performed by: Merrily Pew, MD Authorized by: Merrily Pew, MD   Critical care provider statement:    Critical care time (minutes):  32   Critical care time was exclusive of:  Separately billable procedures and treating other patients and teaching time   Critical care was necessary to treat or prevent imminent or life-threatening deterioration of the following conditions:  Metabolic crisis and endocrine crisis   Critical care was time spent personally by me on the following activities:  Development of treatment plan with patient or surrogate, evaluation of patient's response to treatment, examination of patient, obtaining history from patient or surrogate, review of old charts, re-evaluation of patient's condition, pulse oximetry, ordering and performing treatments and interventions and ordering and review of laboratory studies    Medications Ordered in ED Medications   ondansetron (ZOFRAN) injection 4 mg (has no administration in time range)  pantoprazole (PROTONIX) injection 40 mg (has no administration in time range)  alum & mag hydroxide-simeth (MAALOX/MYLANTA) 200-200-20 MG/5ML suspension 30 mL (has no administration in time range)    And  lidocaine (XYLOCAINE) 2 % viscous mouth solution 15 mL (has no administration in time range)  fentaNYL (SUBLIMAZE) injection 50 mcg (has no administration in time range)  0.9 %  sodium chloride infusion ( Intravenous New Bag/Given 09/10/21 0620)    ED Course/ Medical Decision Making/ A&P                           Medical Decision Making  I suspect patient likely had some indigestion from being on the colchicine and ibuprofen.  This probably caused her to have ulcer and nausea vomiting and subsequent became hyponatremic.  This is probably was related to her recent weakness and syncopal episodes.  Her abdomen is distended so we will get a CT scan to ensure there is no perforation or other abnormalities.  We will treat symptomatically in the meantime.  We will need update her height and weight before her calculated sodium deficit so at this time was a started normal saline infusion.  Care transferred pending CT.       Final Clinical Impression(s) / ED Diagnoses Final diagnoses:  None    Rx / DC Orders ED Discharge Orders     None         Mone Commisso, Corene Cornea, MD 09/22/21 2305

## 2021-09-10 NOTE — H&P (Signed)
Date: 09/10/2021               Patient Name:  Krystal Maddox MRN: [id number]  DOB: 20-Mar-1941 Age / Sex: 82 y.o., female   PCP: [facility] [place]         Medical Service: Internal Medicine Teaching Service         Attending Physician: Dr. Evette Doffing, Mallie Mussel, *    First Contact: Idamae Schuller, MD Pager: Teodora Medici [phone]  Second Contact: Hadassah Pais, MD Pager: PB [phone]       After Hours (After 5p/  First Contact Pager: [phone]  weekends / holidays): Second Contact Pager: [phone]   SUBJECTIVE  Chief Complaint: decreased appetite, nausea   History of Present Illness: Krystal Maddox is a 81 y.o. female with a pertinent PMH of hypertension, CKD3a, peripheral artery disease, severe aortic stenosis s/p TAVR in 23/7628 complicated by postoperative pericarditis for which she is on NSAIDs and colchicine  who presents to [facility] with decreased appetite and nausea .  History obtained from patient and son at bedside via telephone interpreter services. (Interpreter - Emogene Morgan) Patient reports that she has had decreased appetite, nausea and generalized weakness since her TAVR on 08/13/2021. She notes that she is often nauseous at the sight and smell of food and more so when eating it. She was able to eat a grilled fish yesterday. She notes that she does have some associated abdominal discomfort but attributes this to constipation as she has not had a bowel movement in two days. She denies any vomiting but is concerned about ongoing nausea and some abdominal distension. She notes that she also has significant weakness in her legs and feels unsteady when walking. She denies any fevers/chills, chest pain or palpitations, dysphagia or night sweats.  Patient follows with [facility]. Discussed with PCP who notes that patient usually has a poor appetite at baseline and eats only 1-2 meals per day. There are some concerns for possible dementia as patient is a poor historian; however, given  language barrier, screening and definitive testing have been challenging.   In the ED, the patient was evaluated for epigastric abdominal pain, anorexia, generalized weakness for one day duration. Patient is afebrile, mildly bradycardic, normotensive and saturating well on room air.  Labs significant for sodium 114, potassium 2.8, chloride 78, bicarb 22, BUN/creatinine 34/1.1.  No significant AST/ALT/alk phos/T bili abnormalities noted.  CBC with mild leukocytosis 11.9, hemoglobin 8.9, MCV 77.6 and platelets 244.  Lipase mildly elevated 56.  Urinalysis negative for acute infection.  Patient admitted for management of severe hyponatremia.  Medications: No current facility-administered medications on file prior to encounter.   No current outpatient medications on file prior to encounter.  Aspirin 81mg  daily Amlodipine 10mg  daily Colchicine 0.6mg  daily Ibuprofen 400mg  bid Hyzaar 100-12.5mg  daily  Omeprazole 20mg  daily Ferrous sulfate 325mg  daily Pravastatin 80mg  daily   Past Medical History:  Hypertension Chronic kidney disease, stage IIIa Severe aortic stenosis s/p TAVR in 08/2021 Peripheral artery disease   Social:  Patient is from [place] and immigrated to the [place] in June 1995. Patient lives with her son. She is followed by [facility]. Patient is mostly independent in ADLs. She does use a walker to assist with ambulation. Denies any history of tobacco use, alcohol use or illicit drug use. No herbal supplement use.   Family History: Denies any known family history of heart disease, kidney disease, or cancer.   Allergies: Allergies as of 09/09/2021   (  No Known Allergies)    Review of Systems: A complete ROS was negative except as per HPI.   OBJECTIVE:  Physical Exam: Blood pressure (!) 155/43, pulse 65, temperature 97.7 F (36.5 C), temperature source Temporal, resp. rate 15, weight 58.2 kg, SpO2 96 %. Physical Exam  Constitutional: Chronically ill-appearing  elderly female, no acute distress HENT: Normocephalic and atraumatic, EOMI, conjunctiva normal, dry mucous membranes Cardiovascular: Normal rate, regular rhythm, S1 and S2 present, no murmurs, rubs, gallops.  Distal pulses intact Respiratory: No respiratory distress, Lungs are clear to auscultation bilaterally.  Room air GI: Mildly distended, soft, nontender to palpation, no rebound or guarding, decreased bowel sounds Musculoskeletal: Normal bulk and tone.  No peripheral edema noted. Neurological: Is alert and oriented x4, no apparent focal deficits noted. Skin: Warm and dry.  No rash, erythema, lesions noted.  Pertinent Labs: CBC    Component Value Date/Time   WBC 11.9 (H) 09/09/2021 1703   RBC 3.48 (L) 09/09/2021 1703   HGB 8.9 (L) 09/09/2021 1703   HCT 27.0 (L) 09/09/2021 1703   PLT 244 09/09/2021 1703   MCV 77.6 (L) 09/09/2021 1703   MCH 25.6 (L) 09/09/2021 1703   MCHC 33.0 09/09/2021 1703   RDW 13.0 09/09/2021 1703     CMP     Component Value Date/Time   NA 115 (LL) 09/10/2021 0827   K 4.1 09/10/2021 0827   CL 82 (L) 09/10/2021 0827   CO2 22 09/10/2021 0827   GLUCOSE 128 (H) 09/10/2021 0827   BUN 32 (H) 09/10/2021 0827   CREATININE 1.05 (H) 09/10/2021 0827   CALCIUM 8.5 (L) 09/10/2021 0827   PROT 7.8 09/09/2021 1703   ALBUMIN 3.3 (L) 09/09/2021 1703   AST 20 09/09/2021 1703   ALT 12 09/09/2021 1703   ALKPHOS 76 09/09/2021 1703   BILITOT 0.7 09/09/2021 1703   GFRNONAA 53 (L) 09/10/2021 0827    Pertinent Imaging: CT ABDOMEN PELVIS W CONTRAST  Result Date: 09/10/2021 CLINICAL DATA:  Abdominal pain EXAM: CT ABDOMEN AND PELVIS WITH CONTRAST TECHNIQUE: Multidetector CT imaging of the abdomen and pelvis was performed using the standard protocol following bolus administration of intravenous contrast. CONTRAST:  85mL OMNIPAQUE IOHEXOL 350 MG/ML SOLN COMPARISON:  None. FINDINGS: Lower chest: The lung bases are clear. An aortic valve prosthesis is noted. The imaged heart is  otherwise unremarkable. Hepatobiliary: A 1.0 cm gallstone is noted. There are no signs of acute cholecystitis. The liver is unremarkable. There is no biliary ductal dilatation. Pancreas: Unremarkable. Spleen: A small hypodense lesion in the spleen is indeterminate but may reflect a cyst or hemangioma. Adrenals/Urinary Tract: The adrenals are unremarkable. There is a 1.7 cm exophytic right upper pole cyst. A few additional subcentimeter hypodense lesions in the kidneys are too small to characterize but most likely reflect additional cysts. There are no stones. There is no hydronephrosis or hydroureter. There is cortical thinning bilaterally, left worse than right. The bladder is unremarkable. Stomach/Bowel: The stomach is unremarkable. There is no evidence of bowel obstruction. There is no abnormal bowel wall thickening or inflammatory change. The appendix is normal. Vascular/Lymphatic: There is scattered calcified atherosclerotic plaque throughout the nonaneurysmal abdominal aorta. The major branch vessels are patent. The main portal and splenic veins are patent. There is no abdominal or pelvic lymphadenopathy. Reproductive: The uterus and adnexa are unremarkable. Other: There is no ascites or free air. Dystrophic calcifications in the gluteal fat bilaterally are noted. Musculoskeletal: There is no acute osseous abnormality or aggressive osseous lesion. IMPRESSION:  1. Cholelithiasis without evidence of acute cholecystitis. 2. Otherwise, no acute findings in the abdomen or pelvis. Aortic Atherosclerosis (ICD10-I70.0). Electronically Signed   By: Valetta Mole M.D.   On: 09/10/2021 07:58    EKG: personally reviewed my interpretation is NSR, ST elevations persistent in III and aVF, V4-6 but improved compared to prior EKG, Qtc 455   ASSESSMENT & PLAN:  Assessment: Principal Problem:   Hyponatremia   Hingaeh Maull is a 81 y.o. with pertinent PMH of hypertension, CKD 3a, peripheral artery disease, severe aortic  stenosis s/p TAVR in 17/5102 complicated by postoperative pancreatitis, who presented with anorexia generalized weakness and admit for hypotonic hyponatremia on hospital day 0  Plan: #Hypotonic Hyponatremia Patient is presenting for acute on chronic hyponatremia in setting of decreased oral intake over the past several weeks and ongoing thiazide use. Patient reports that she has had a decreased appetite and nausea since her TAVR procedure. Na 128 3 weeks prior when patient presented to the ED for postoperative pericarditis. Prior to this, Na levels have been wnl and was 137 in October with her PCP. Patient's hyponatremia appears to be acute/subacute at this time. Patient received 0.9%NS in the ED with improvement to 120. Goal correction 6-70mEq per 24 hour period.  - Adjust to 1/2NS  - BMP q6h  - Holding thiazide   #Anorexia  #Physical deconditioning Patient endorses nausea and decreased appetite since her TAVR. She denies any vomiting but does endorse abdominal bloating and constipation. Patient notes that she has not eaten much over the past several weeks but does note tolerating a grilled fish yesterday. Also endorses generalized weakness and unsteady gait. CT Abdomen/Pelvis in the ED negative for acute abdominal pathology. Patient initially endorsed epigastric pain in the ED that resolved with GI cocktail. Suspect she may have component of gastritis since being on NSAIDs and colchicine for her pericarditis.  - Holding NSAIDs and colchicine at this time - IV zofran $RemoveB'4mg'[id number]$  prn for nausea - PT/OT eval - Encourage oral intake, consult to RD for nutrition supplements   #Constipation Patient endorsing constipation for several weeks and notes that she last had a bowel movement two days prior and it was small. She denies any change in the color of her stool. No recent opiate use. Abdomen is mildly distended but soft and nontender to palpation.  - Scheduled bowel regimen with senokot bid and miralax  daily  #Postoperative pericarditis #S/p TAVR for severe aortic stenosis 08/2021 Patient was evaluated on 12/9 following her TAVR procedure on 12/6 for sever aortic stenosis for chest pain. She was noted to have diffuse ST segment elevations on EKG and small pericardial effusion on bed-side Echo. Patient was discharged with NSAIDs and colchicine.  She was evaluated in the cardiologist office on 12/14 with improving symptoms.  EKG with ongoing ST elevation but improved from prior.  Leukocytosis improving.  She was recommended to continue therapy for 1 month.  Today, patient is asymptomatic.  EKG does have mild ST elevations; however, this is improved from prior.  Leukocytosis continues to trend down.  Given concern for gastritis and colchicine use and that she is currently asymptomatic, will NSAIDs and colchicine at this time. -Continue to monitor -If symptomatic, can add steroids  #Hypertension Patient with history of hypertension on amlodipine 10 mg daily and Hyzaar 100-12.5 mg daily.  -Resume home amlodipine -Resume losartan; holding HCTZ in setting of hyponatremia.  #Peripheral artery disease - Resume home aspirin and statin  #CKD 3a Renal function is at baseline.  -  Trend renal function - Avoid nephrotoxic agents   #Chronic microcytic anemia  #Iron deficiency anemia Patient has a history of iron deficiency anemia for which she is on iron supplementation.  Hemoglobin and MCV stable at this time.  No signs of active bleeding. -Continue iron supplementation -Trend CBC  Best Practice: Diet: Cardiac diet IVF: Fluids: 0.45NS, Rate:  100 cc/hr x 10 hrs VTE: enoxaparin (LOVENOX) injection 40 mg Start: 09/10/21 1200 Code: Full AB: None Status: Inpatient with expected length of stay greater than 2 midnights. Anticipated Discharge Location: Home Barriers to Discharge: Medical stability  Signature: Harvie Heck, MD Internal Medicine Resident, PGY-3 [facility]   Pager: [phone] 11:00 AM, 09/10/2021   Please contact the on call pager after 5 pm and on weekends at [phone].

## 2021-09-11 ENCOUNTER — Inpatient Hospital Stay (HOSPITAL_COMMUNITY): Payer: Medicare (Managed Care)

## 2021-09-11 DIAGNOSIS — E871 Hypo-osmolality and hyponatremia: Secondary | ICD-10-CM | POA: Diagnosis not present

## 2021-09-11 LAB — CBC
HCT: 24.5 % — ABNORMAL LOW (ref 36.0–46.0)
Hemoglobin: 8.2 g/dL — ABNORMAL LOW (ref 12.0–15.0)
MCH: 26.1 pg (ref 26.0–34.0)
MCHC: 33.5 g/dL (ref 30.0–36.0)
MCV: 78 fL — ABNORMAL LOW (ref 80.0–100.0)
Platelets: 241 10*3/uL (ref 150–400)
RBC: 3.14 MIL/uL — ABNORMAL LOW (ref 3.87–5.11)
RDW: 13.4 % (ref 11.5–15.5)
WBC: 10.2 10*3/uL (ref 4.0–10.5)
nRBC: 0 % (ref 0.0–0.2)

## 2021-09-11 LAB — BASIC METABOLIC PANEL
Anion gap: 7 (ref 5–15)
Anion gap: 9 (ref 5–15)
Anion gap: 9 (ref 5–15)
Anion gap: 7 (ref 5–15)
BUN: 37 mg/dL — ABNORMAL HIGH (ref 8–23)
BUN: 35 mg/dL — ABNORMAL HIGH (ref 8–23)
BUN: 35 mg/dL — ABNORMAL HIGH (ref 8–23)
BUN: 31 mg/dL — ABNORMAL HIGH (ref 8–23)
CO2: 24 mmol/L (ref 22–32)
CO2: 21 mmol/L — ABNORMAL LOW (ref 22–32)
CO2: 20 mmol/L — ABNORMAL LOW (ref 22–32)
CO2: 23 mmol/L (ref 22–32)
Calcium: 8.5 mg/dL — ABNORMAL LOW (ref 8.9–10.3)
Calcium: 8.4 mg/dL — ABNORMAL LOW (ref 8.9–10.3)
Calcium: 8.1 mg/dL — ABNORMAL LOW (ref 8.9–10.3)
Calcium: 8.4 mg/dL — ABNORMAL LOW (ref 8.9–10.3)
Chloride: 92 mmol/L — ABNORMAL LOW (ref 98–111)
Chloride: 93 mmol/L — ABNORMAL LOW (ref 98–111)
Chloride: 92 mmol/L — ABNORMAL LOW (ref 98–111)
Chloride: 95 mmol/L — ABNORMAL LOW (ref 98–111)
Creatinine, Ser: 1.53 mg/dL — ABNORMAL HIGH (ref 0.44–1.00)
Creatinine, Ser: 1.32 mg/dL — ABNORMAL HIGH (ref 0.44–1.00)
Creatinine, Ser: 1.35 mg/dL — ABNORMAL HIGH (ref 0.44–1.00)
Creatinine, Ser: 1.57 mg/dL — ABNORMAL HIGH (ref 0.44–1.00)
GFR, Estimated: 34 mL/min — ABNORMAL LOW (ref >60)
GFR, Estimated: 41 mL/min — ABNORMAL LOW (ref >60)
GFR, Estimated: 39 mL/min — ABNORMAL LOW (ref >60)
GFR, Estimated: 33 mL/min — ABNORMAL LOW (ref >60)
Glucose, Bld: 113 mg/dL — ABNORMAL HIGH (ref 70–99)
Glucose, Bld: 128 mg/dL — ABNORMAL HIGH (ref 70–99)
Glucose, Bld: 153 mg/dL — ABNORMAL HIGH (ref 70–99)
Glucose, Bld: 119 mg/dL — ABNORMAL HIGH (ref 70–99)
Potassium: 4.8 mmol/L (ref 3.5–5.1)
Potassium: 4.2 mmol/L (ref 3.5–5.1)
Potassium: 4.8 mmol/L (ref 3.5–5.1)
Potassium: 4.9 mmol/L (ref 3.5–5.1)
Sodium: 123 mmol/L — ABNORMAL LOW (ref 135–145)
Sodium: 123 mmol/L — ABNORMAL LOW (ref 135–145)
Sodium: 121 mmol/L — ABNORMAL LOW (ref 135–145)
Sodium: 125 mmol/L — ABNORMAL LOW (ref 135–145)

## 2021-09-11 LAB — CORTISOL-AM, BLOOD: Cortisol - AM: 9.9 ug/dL (ref 6.7–22.6)

## 2021-09-11 MED ORDER — SODIUM CHLORIDE 0.45 % IV SOLN: INTRAVENOUS | Status: DC

## 2021-09-11 MED ORDER — ADULT MULTIVITAMIN W/MINERALS CH
1.0000 | ORAL_TABLET | Freq: Every day | ORAL | Status: DC
  Administered 2021-09-11: 1 via ORAL
  Filled 2021-09-11 (×2): qty 1

## 2021-09-11 MED ORDER — ENOXAPARIN SODIUM 30 MG/0.3ML IJ SOSY
30.0000 mg | PREFILLED_SYRINGE | INTRAMUSCULAR | Status: DC
  Administered 2021-09-12: 30 mg via SUBCUTANEOUS
  Filled 2021-09-11: qty 0.3

## 2021-09-11 MED ORDER — DEXTROSE 5 % IV SOLN: INTRAVENOUS | Status: DC

## 2021-09-11 MED ORDER — PANTOPRAZOLE SODIUM 40 MG PO TBEC
40.0000 mg | DELAYED_RELEASE_TABLET | Freq: Every day | ORAL | Status: DC
  Administered 2021-09-11 – 2021-09-12 (×2): 40 mg via ORAL
  Filled 2021-09-11 (×2): qty 1

## 2021-09-11 MED ORDER — SODIUM CHLORIDE 0.9 % IV SOLN: INTRAVENOUS | Status: DC

## 2021-09-11 MED ORDER — PRAVASTATIN SODIUM 40 MG PO TABS
80.0000 mg | ORAL_TABLET | Freq: Every evening | ORAL | Status: DC
  Administered 2021-09-11: 80 mg via ORAL
  Filled 2021-09-11: qty 2

## 2021-09-11 NOTE — TOC Initial Note (Signed)
Transition of Care [facility]) - Initial/Assessment Note    Patient Details  Name: Krystal Maddox MRN: [id number] Date of Birth: Nov 16, 1940  Transition of [facility]) CM/SW Contact:    Milinda Antis, [place] Phone Number: 09/11/2021, 11:57 AM  Clinical Narrative:                 CSW contacted [facility] to initiate the discussion on discharge planning.  [facility] is aware that the patient is in the hospital.  The social worker to contact to assist with discharge is Olivia Mackie at [phone].          Patient Goals and CMS Choice        Expected Discharge Plan and Services                                                Prior Living Arrangements/Services                       Activities of Daily Living Home Assistive Devices/Equipment: Cane (specify quad or straight) ADL Screening (condition at time of admission) Patient's cognitive ability adequate to safely complete daily activities?: Yes Is the patient deaf or have difficulty hearing?: No Does the patient have difficulty seeing, even when wearing glasses/contacts?: No Does the patient have difficulty concentrating, remembering, or making decisions?: No Patient able to express need for assistance with ADLs?: Yes Does the patient have difficulty dressing or bathing?: Yes Independently performs ADLs?: No Communication: Independent with device (comment) (interpreter) Dressing (OT): Needs assistance Is this a change from baseline?: Change from baseline, expected to last <3days Grooming: Needs assistance Is this a change from baseline?: Change from baseline, expected to last <3 days Feeding: Independent Bathing: Needs assistance Is this a change from baseline?: Change from baseline, expected to last <3 days Toileting: Needs assistance Is this a change from baseline?: Change from baseline, expected to last <3 days In/Out Bed: Needs assistance Is this a change from baseline?: Change from baseline, expected to last  <3 days Walks in Home: Independent with device (comment) Does the patient have difficulty walking or climbing stairs?: Yes Weakness of Legs: Both Weakness of Arms/Hands: Both  Permission Sought/Granted                  Emotional Assessment              Admission diagnosis:  Hyponatremia [E87.1] Patient Active Problem List   Diagnosis Date Noted   Hypo-osmolar hyponatremia 09/10/2021   Stage 3a chronic kidney disease (CKD) ([place]) 09/10/2021   Hypertension 09/10/2021   Aortic valve stenosis 09/10/2021   PCP:  [facility] [place]:  No Pharmacies Listed    Social Determinants of Health (SDOH) Interventions    Readmission Risk Interventions No flowsheet data found.

## 2021-09-11 NOTE — Progress Notes (Signed)
PHARMACIST - PHYSICIAN COMMUNICATION  DR:   Evette Doffing  CONCERNING: IV to Oral Route Change Policy  RECOMMENDATION: This patient is receiving Protonix by the intravenous route.  Based on criteria approved by the Pharmacy and Therapeutics Committee, the intravenous medication(s) is/are being converted to the equivalent oral dose form(s).   DESCRIPTION: These criteria include: The patient is eating (either orally or via tube) and/or has been taking other orally administered medications for a least 24 hours The patient has no evidence of active gastrointestinal bleeding or impaired GI absorption (gastrectomy, short bowel, patient on TNA or NPO).  If you have questions about this conversion, please contact the Pharmacy Department  []   [phone] )  [facility] []   [phone] )  [facility] [x]   [phone] )  [facility] []   [phone] )  [facility] []   [phone] )  [facility]

## 2021-09-11 NOTE — Progress Notes (Addendum)
Subjective:   Overnight:NAEON. Na improved to 123 overnight.  Denied any acute concerns. Stated she felt better today compared to yesterday but not at baseline. Counseled patient on water restriction needed for her hyponatremia.    Objective:  Vital signs in last 24 hours: Vitals:   09/10/21 2011 09/10/21 2012 09/11/21 0411 09/11/21 0922  BP: (!) 150/39  (!) 140/38 (!) 126/39  Pulse: 77  65 71  Resp: 18  18 17   Temp: 98.4 F (36.9 C)  98.1 F (36.7 C) 98 F (36.7 C)  TempSrc: Oral  Oral Oral  SpO2: 100%  100% 100%  Weight:  61 kg    Height:  4\' 11"  (1.499 m)     Supplemental O2: Room Air SpO2: 100 % Physical Exam General: NAD Mouth and Throat:  Moist mucus membrane. Lungs: CTAB, no wheeze, rhonchi or rales.  Cardiovascular: Normal heart sounds, no r/m/g, 2+ pulses radially Abdomen: No TTP, normal bowel sounds Skin: warm and dry, no lesions Neuro: Alert and oriented. CN grossly intact   Filed Weights   09/10/21 0702 09/10/21 2012  Weight: 58.2 kg 61 kg     Intake/Output Summary (Last 24 hours) at 09/11/2021 1148 Last data filed at 09/11/2021 0900 Gross per 24 hour  Intake 800.02 ml  Output 1100 ml  Net -299.98 ml   Net IO Since Admission: -299.98 mL [09/11/21 1148]  CBC Latest Ref Rng & Units 09/11/2021 09/09/2021  WBC 4.0 - 10.5 K/uL 10.2 11.9(H)  Hemoglobin 12.0 - 15.0 g/dL 8.2(L) 8.9(L)  Hematocrit 36.0 - 46.0 % 24.5(L) 27.0(L)  Platelets 150 - 400 K/uL 241 244    BMP Latest Ref Rng & Units 09/11/2021 09/11/2021 09/10/2021  Glucose 70 - 99 mg/dL 128(H) 113(H) 149(H)  BUN 8 - 23 mg/dL 35(H) 37(H) 37(H)  Creatinine 0.44 - 1.00 mg/dL 1.32(H) 1.53(H) 1.39(H)  Sodium 135 - 145 mmol/L 123(L) 123(L) 121(L)  Potassium 3.5 - 5.1 mmol/L 4.2 4.8 4.4  Chloride 98 - 111 mmol/L 93(L) 92(L) 89(L)  CO2 22 - 32 mmol/L 21(L) 24 21(L)  Calcium 8.9 - 10.3 mg/dL 8.4(L) 8.5(L) 8.3(L)    Am Cortisol: 9.9  Assessment/Plan:  Krystal Maddox is a 81 y.o. female with a pertinent  PMH of hypertension, CKD3a, peripheral artery disease, severe aortic stenosis s/p TAVR in 84/6659 complicated by postoperative pericarditis who presents to [facility] with decreased appetite and nausea found to be hyponatremic at 114.   Hypo-osmolar Hyponatremia Multifactorial. Improving. Patient on IV 0.45 % NS currently. Goal is 127 by the end of the day. Serum osmolality 266. Urine osmolatity 272. Am cortisol 9.9 making adrenal insufficiency an unlikely cause. Consistent with low solute intake. Patient endorsed drinking a lot of water at home and diet consisted of rice, fruits and vegetables with a little bit of meat in form of seafood.   -Continue with half normal saline today -BMP q 6hrs -Restrict to 2 L of oral intake -Protein supplementation and salt tabs may be helpful in the future -Discontinued thiazide diuretic  Anorexia Physical Deconditioning Multifactorial 2/2 to hyponatremia and suspected gastritis and recent surgery. Patient states she is feeling better today than yesterday.  -Continue Protonix 40 mg qd -Zofran prn -Dietitian consult -PT/OT consult  S/p TAVR for severe stenosis 08/2021 Patient had findings consistent with pericarditis in s/p of TAVR. Holding NSAIDs and colchicine due to gastritis concern. Denies any chest pain. No syncope or light headedness to suggest valve dysfunction. -Hold NSAIDs and colchicine  -Continue Protonix as above.  -  CTM  Hypertension AKI on CKD IIIa Creatinine elevation from 1.1 to 1.5 in setting of fluids. Potentially post-renal cause. Continue Norvasc 10 mg. Hold Hyzaar 100-12.5 mg. BP readings ranging from 126-150/40s Bladder ultrasound due to worsening in setting of hydration.  -Continue Norvasc 10 mg qd. Holding Hyzaar 100-12.5 mg qd -f/u bladder ultrasound -CTM kidney fxn  Peripheral Artery Disease Chronic. -Continue home Aspirin 81 mg and Pravastatin 80 mg qd  IDA Chronic. Hgb improved 8.2 to 8.9. -Continue iron 325 mg  qd  Constipation Patient's baseline is a bowel movement every other day. States had constipation after her surgery. Suspect 2/2 to decreased mobility and low po intake. Had 2 bowel movements overnight.  -Bowel regimen as needed for constipation.   Diet: Carb-Modified IVF: 1/2NS 75 cc/hr VTE: Enoxaparin Code: Full Prior to Admission Living Arrangement:Home Anticipated Discharge Location:TBD Barriers to Discharge:Medical workup Dispo: Anticipated discharge in approximately 2-3 day(s).   Krystal Schuller, MD Tillie Rung. [facility] Internal Medicine Residency, PGY-1  Pager: [phone] After 5 pm and on weekends: Please call the on-call pager

## 2021-09-11 NOTE — Progress Notes (Signed)
Multiple trips to the bathroom. Urinating and having loose bowel movement. (Greenish/brown) Laxative held. Needs attended.

## 2021-09-11 NOTE — Evaluation (Signed)
Occupational Therapy Evaluation Patient Details Name: Krystal Maddox MRN: [id number] DOB: 1941/04/12 Today's Date: 09/11/2021   History of Present Illness 81 y.o. female presenting to ED 1/2 with c/o abdominal discomfort, poor p.o. intake, nausea, cough and generalized weakness. Patient admitted with hypotonic hyponatremia. PMHx significant for HTN, CKD III, PAD, severe aortic stenosis s/p TAVR 12/22 with postop pericarditis on NSAIDs and colchicine.   Clinical Impression   PTA patient was living with her son in a private residence and was grossly Mod I with ADLs/IADLs with AD. Patient currently functioning slightly below baseline requiring Min guard grossly for functional transfers, mobility and observed ADLs. Patient also limited by deficits listed below including generalized weakness and decreased dynamic balance and would benefit from continued acute OT services in prep for safe d/c home with PRN supervision/assist from son. Patient would also benefit from [facility] to maximize safety/independence with self-care tasks.        Recommendations for follow up therapy are one component of a multi-disciplinary discharge planning process, led by the attending physician.  Recommendations may be updated based on patient status, additional functional criteria and insurance authorization.   Follow Up Recommendations  Other (comment) (Follow-up OT through [facility])    Assistance Recommended at Discharge Frequent or constant Supervision/Assistance  Patient can return home with the following A little help with walking and/or transfers;A little help with bathing/dressing/bathroom;Assistance with cooking/housework    Functional Status Assessment  Patient has had a recent decline in their functional status and demonstrates the ability to make significant improvements in function in a reasonable and predictable amount of time.  Equipment Recommendations  Other (comment);BSC/3in1 (rolling walker)     Recommendations for Other Services       Precautions / Restrictions Precautions Precautions: Fall Restrictions Weight Bearing Restrictions: No      Mobility Bed Mobility               General bed mobility comments: Met ambulating in hallway with PT.    Transfers Overall transfer level: Needs assistance Equipment used: 1 person hand held assist Transfers: Sit to/from Stand Sit to Stand: Min guard           General transfer comment: Min guard for sit to stand from EOB per PT report.      Balance Overall balance assessment: Needs assistance Sitting-balance support: No upper extremity supported;Feet supported Sitting balance-Leahy Scale: Good     Standing balance support: Single extremity supported;Bilateral upper extremity supported;During functional activity Standing balance-Leahy Scale: Fair Standing balance comment: Reliant on unilateral vs bilateral UE support with mobility.                           ADL either performed or assessed with clinical judgement   ADL Overall ADL's : Needs assistance/impaired     Grooming: Min guard;Standing   Upper Body Bathing: Set up;Sitting   Lower Body Bathing: Min guard;Sit to/from stand   Upper Body Dressing : Set up;Sitting   Lower Body Dressing: Min guard;Sit to/from stand   Toilet Transfer: Agricultural engineer (2 wheels)   Toileting- Water quality scientist and Hygiene: Min guard;Sit to/from stand               Vision Baseline Vision/History: 1 Wears glasses (readers) Ability to See in Adequate Light: 0 Adequate Patient Visual Report: No change from baseline Vision Assessment?: No apparent visual deficits     Perception     Praxis  Pertinent Vitals/Pain Pain Assessment: No/denies pain     Hand Dominance Right   Extremity/Trunk Assessment Upper Extremity Assessment Upper Extremity Assessment: Generalized weakness   Lower Extremity Assessment Lower Extremity Assessment:  Defer to PT evaluation   Cervical / Trunk Assessment Cervical / Trunk Assessment: Normal   Communication Communication Communication: Interpreter utilized;Prefers language other than English;Other (comment) (Speaks [name])   Cognition Arousal/Alertness: Awake/alert Behavior During Therapy: WFL for tasks assessed/performed Overall Cognitive Status: Difficult to assess                                 General Comments: Appears to answer all questions via interpreter without difficulty.     General Comments  VSS on RA.    Exercises     Shoulder Instructions      Home Living Family/patient expects to be discharged to:: Private residence Living Arrangements: Children;Other relatives Available Help at Discharge: Family Type of Home: House Home Access: Stairs to enter [employer] of Steps: 3 Entrance Stairs-Rails: Right;Left;Can reach both Home Layout: One level     Bathroom Shower/Tub: Teacher, early years/pre: Standard     Home Equipment: Cane - quad          Prior Functioning/Environment Prior Level of Function : Independent/Modified Independent             Mobility Comments: QC for household and community mobility. ADLs Comments: Mod I with ADLs; [facility] weekly on Friday's.        OT Problem List: Decreased activity tolerance;Impaired balance (sitting and/or standing);Decreased knowledge of use of DME or AE      OT Treatment/Interventions: Self-care/ADL training;Therapeutic exercise;Energy conservation;DME and/or AE instruction;Therapeutic activities;Patient/family education;Balance training    OT Goals(Current goals can be found in the care plan section) Acute Rehab OT Goals Patient Stated Goal: No goals stated. OT Goal Formulation: With patient Time For Goal Achievement: 09/25/21 Potential to Achieve Goals: Good  OT Frequency: Min 2X/week    Co-evaluation              AM-PAC OT "6 Clicks"  Daily Activity     Outcome Measure Help from another person eating meals?: None Help from another person taking care of personal grooming?: A Little Help from another person toileting, which includes using toliet, bedpan, or urinal?: A Little Help from another person bathing (including washing, rinsing, drying)?: A Little Help from another person to put on and taking off regular upper body clothing?: A Little Help from another person to put on and taking off regular lower body clothing?: A Little 6 Click Score: 19   End of Session Equipment Utilized During Treatment: Gait belt;Rolling walker (2 wheels);Other (comment) (QC) Nurse Communication: Mobility status  Activity Tolerance: Patient tolerated treatment well Patient left:    OT Visit Diagnosis: Muscle weakness (generalized) (M62.81);Unsteadiness on feet (R26.81)                Time: 3709-6438 OT Time Calculation (min): 10 min Charges:  OT General Charges $OT Visit: 1 Visit OT Evaluation $OT Eval Low Complexity: 1 Low  Lagina Reader H. OTR/L Supplemental OT, Department of rehab services [phone]  Marlinda Miranda R H. 09/11/2021, 11:49 AM

## 2021-09-11 NOTE — Progress Notes (Signed)
Initial Nutrition Assessment  DOCUMENTATION CODES:  Not applicable  INTERVENTION:  Add double portions to meals.  Add Magic cup TID with meals, each supplement provides 290 kcal and 9 grams of protein.  Add MVI with minerals daily.  Encourage PO and supplement intake.  NUTRITION DIAGNOSIS:  Moderate Malnutrition related to chronic illness (severe aortic stenosis) as evidenced by mild fat depletion, mild muscle depletion.  GOAL:  Patient will meet greater than or equal to 90% of their needs  MONITOR:  PO intake, Supplement acceptance, Labs, Weight trends, I & O's  REASON FOR ASSESSMENT:  Malnutrition Screening Tool, Consult Assessment of nutrition requirement/status  ASSESSMENT:  81 yo female with a PMH of hypertension, CKD3a, peripheral artery disease, severe aortic stenosis s/p TAVR in 00/1749 complicated by postoperative pericarditis who presents to [facility] with decreased appetite and nausea found to be hyponatremic at 114.  Per Epic, pt ate 0% of dinner last night and 75% of breakfast this morning.  Reports an appetite, eating almost all of her lunch per RD observation.  Per MD note, pt eating diet "consistent with low solute intake. Patient endorsed drinking a lot of water at home and diet consisted of rice, fruits and vegetables with a little bit of meat in form of seafood."  Pt unsure of weight changes and history.   RD to provide double portions at meals and Magic Cup TID, as well as MVI with minerals daily.  Medications: reviewed; ferrous sulfate, miralax, Senokot BID, NaCl @ 75 ml/hr  Labs: reviewed; Na 123 (L), Glucose 128 (H), BUN 35 (H - trending down), Crt 1.32 (H - trending down)  NUTRITION - FOCUSED PHYSICAL EXAM: Flowsheet Row Most Recent Value  Orbital Region Mild depletion  Upper Arm Region No depletion  Thoracic and Lumbar Region No depletion  Buccal Region Mild depletion  Temple Region Mild depletion  Clavicle Bone Region No depletion  Clavicle and  Acromion Bone Region No depletion  Scapular Bone Region No depletion  Dorsal Hand No depletion  Patellar Region Mild depletion  Anterior Thigh Region Mild depletion  Posterior Calf Region Mild depletion  Edema (RD Assessment) None  Hair Reviewed  Eyes Reviewed  Mouth Reviewed  Skin Reviewed  Nails Reviewed   Diet Order:   Diet Order             Diet Heart Room service appropriate? Yes; Fluid consistency: Thin; Fluid restriction: 2000 mL Fluid  Diet effective now                  EDUCATION NEEDS:  Education needs have been addressed  Skin:  Skin Assessment: Reviewed RN Assessment (Ecchymosis)  Last BM:  09/11/21 - Type 7, brown/green, medium  Height:  Ht Readings from Last 1 Encounters:  09/10/21 4\' 11"  (1.499 m)   Weight:  Wt Readings from Last 1 Encounters:  09/10/21 61 kg   BMI:  Body mass index is 27.16 kg/m.  Estimated Nutritional Needs:  Kcal:  1800-2000 Protein:  80-95 grams Fluid:  2 L  Derrel Nip, RD, LDN (she/her/hers) Clinical Inpatient Dietitian RD Pager/After-Hours/Weekend Pager # in [place]

## 2021-09-11 NOTE — Evaluation (Signed)
Physical Therapy Evaluation Patient Details Name: Krystal Maddox MRN: [id number] DOB: Mar 09, 1941 Today's Date: 09/11/2021  History of Present Illness  81 y.o. female presenting to ED 1/2 with c/o abdominal discomfort, poor p.o. intake, nausea, cough and generalized weakness. Patient admitted with hypotonic hyponatremia. PMHx significant for HTN, CKD III, PAD, severe aortic stenosis s/p TAVR 12/22 with postop pericarditis on NSAIDs and colchicine.  Clinical Impression  PTA, pt lives with her son, was using a quad cane for mobility and independent with ADL's. Pt presents with decreased cardiopulmonary endurance and balance deficits. Requiring supervision for transfers and ambulating 100 ft with quad cane vs RW for mobility. Pt appears to have improved balance and gait speed with RW. Will continue to follow acutely to progress mobility as tolerated.       Recommendations for follow up therapy are one component of a multi-disciplinary discharge planning process, led by the attending physician.  Recommendations may be updated based on patient status, additional functional criteria and insurance authorization.  Follow Up Recommendations Other (comment) (follow up PT through [facility])    Assistance Recommended at Discharge PRN  Patient can return home with the following  A little help with walking and/or transfers;A little help with bathing/dressing/bathroom;Assistance with cooking/housework    Equipment Recommendations Rolling walker (2 wheels) (youth)  Recommendations for Other Services       Functional Status Assessment Patient has had a recent decline in their functional status and demonstrates the ability to make significant improvements in function in a reasonable and predictable amount of time.     Precautions / Restrictions Precautions Precautions: Fall Restrictions Weight Bearing Restrictions: No      Mobility  Bed Mobility Overal bed mobility: Modified Independent              General bed mobility comments: Met ambulating in hallway with PT.    Transfers Overall transfer level: Needs assistance Equipment used: Quad cane;Rolling walker (2 wheels) Transfers: Sit to/from Stand Sit to Stand: Supervision           General transfer comment: Min guard for sit to stand from EOB per PT report.    Ambulation/Gait Ambulation/Gait assistance: Min guard Gait Distance (Feet): 100 Feet Assistive device: Rolling walker (2 wheels);Quad cane Gait Pattern/deviations: Step-through pattern;Decreased stride length Gait velocity: decreased Gait velocity interpretation: <1.8 ft/sec, indicate of risk for recurrent falls   General Gait Details: Pt initially using quad cane, but noted she was not placing all 4 points on ground despite cues. Improved speed and flow with use of walker in comparison.  Stairs            Wheelchair Mobility    Modified Rankin (Stroke Patients Only)       Balance Overall balance assessment: Needs assistance Sitting-balance support: No upper extremity supported;Feet supported Sitting balance-Leahy Scale: Good     Standing balance support: Single extremity supported;Bilateral upper extremity supported;During functional activity Standing balance-Leahy Scale: Poor Standing balance comment: Reliant on unilateral vs bilateral UE support with mobility.                             Pertinent Vitals/Pain Pain Assessment: No/denies pain    Home Living Family/patient expects to be discharged to:: Private residence Living Arrangements: Children;Other relatives Available Help at Discharge: Family Type of Home: House Home Access: Stairs to enter Entrance Stairs-Rails: Right;Left;Can reach both Entrance Stairs-Number of Steps: 3   Home Layout: One level Home Equipment: Cane - quad  Prior Function Prior Level of Function : Independent/Modified Independent             Mobility Comments: QC for household and  community mobility. ADLs Comments: Mod I with ADLs; attends [facility] 1x weekly on Friday's.     Hand Dominance   Dominant Hand: Right    Extremity/Trunk Assessment   Upper Extremity Assessment Upper Extremity Assessment: Defer to OT evaluation    Lower Extremity Assessment Lower Extremity Assessment: Generalized weakness    Cervical / Trunk Assessment Cervical / Trunk Assessment: Normal  Communication   Communication: Interpreter utilized;Prefers language other than English;Other (comment) (Speaks [name])  Cognition Arousal/Alertness: Awake/alert Behavior During Therapy: WFL for tasks assessed/performed Overall Cognitive Status: Difficult to assess                                 General Comments: Appears to answer all questions via interpreter without difficulty.        General Comments General comments (skin integrity, edema, etc.): VSS on RA.    Exercises     Assessment/Plan    PT Assessment Patient needs continued PT services  PT Problem List Decreased strength;Decreased activity tolerance;Decreased balance;Decreased mobility       PT Treatment Interventions DME instruction;Gait training;Functional mobility training;Therapeutic activities;Therapeutic exercise;Balance training;Patient/family education    PT Goals (Current goals can be found in the Care Plan section)  Acute Rehab PT Goals Patient Stated Goal: did not state PT Goal Formulation: With patient Time For Goal Achievement: 09/25/21 Potential to Achieve Goals: Good    Frequency Min 3X/week     Co-evaluation               AM-PAC PT "6 Clicks" Mobility  Outcome Measure Help needed turning from your back to your side while in a flat bed without using bedrails?: None Help needed moving from lying on your back to sitting on the side of a flat bed without using bedrails?: None Help needed moving to and from a bed to a chair (including a wheelchair)?: A Little Help needed  standing up from a chair using your arms (e.g., wheelchair or bedside chair)?: A Little Help needed to walk in hospital room?: A Little Help needed climbing 3-5 steps with a railing? : A Little 6 Click Score: 20    End of Session Equipment Utilized During Treatment: Gait belt Activity Tolerance: Patient tolerated treatment well Patient left: in bed;with call bell/phone within reach;with bed alarm set;with family/visitor present Nurse Communication: Mobility status PT Visit Diagnosis: Unsteadiness on feet (R26.81);Difficulty in walking, not elsewhere classified (R26.2)    Time: 2585-2778 PT Time Calculation (min) (ACUTE ONLY): 10 min   Charges:   PT Evaluation $PT Eval Low Complexity: [place], PT, DPT Acute Rehabilitation Services Pager [phone] Office [phone]   Deno Etienne 09/11/2021, 2:57 PM

## 2021-09-11 NOTE — Plan of Care (Signed)
  Problem: Clinical Measurements: Goal: Ability to maintain clinical measurements within normal limits will improve Outcome: Progressing Goal: Will remain free from infection Outcome: Progressing Goal: Cardiovascular complication will be avoided Outcome: Progressing   

## 2021-09-12 ENCOUNTER — Other Ambulatory Visit (HOSPITAL_COMMUNITY): Payer: Self-pay

## 2021-09-12 DIAGNOSIS — E871 Hypo-osmolality and hyponatremia: Secondary | ICD-10-CM | POA: Diagnosis not present

## 2021-09-12 LAB — BASIC METABOLIC PANEL
Anion gap: 15 (ref 5–15)
Anion gap: 4 — ABNORMAL LOW (ref 5–15)
BUN: 30 mg/dL — ABNORMAL HIGH (ref 8–23)
BUN: 29 mg/dL — ABNORMAL HIGH (ref 8–23)
CO2: 22 mmol/L (ref 22–32)
CO2: 24 mmol/L (ref 22–32)
Calcium: 8.3 mg/dL — ABNORMAL LOW (ref 8.9–10.3)
Calcium: 8.5 mg/dL — ABNORMAL LOW (ref 8.9–10.3)
Chloride: 90 mmol/L — ABNORMAL LOW (ref 98–111)
Chloride: 102 mmol/L (ref 98–111)
Creatinine, Ser: 1.58 mg/dL — ABNORMAL HIGH (ref 0.44–1.00)
Creatinine, Ser: 1.41 mg/dL — ABNORMAL HIGH (ref 0.44–1.00)
GFR, Estimated: 33 mL/min — ABNORMAL LOW (ref >60)
GFR, Estimated: 37 mL/min — ABNORMAL LOW (ref >60)
Glucose, Bld: 121 mg/dL — ABNORMAL HIGH (ref 70–99)
Glucose, Bld: 117 mg/dL — ABNORMAL HIGH (ref 70–99)
Potassium: 4.7 mmol/L (ref 3.5–5.1)
Potassium: 5.4 mmol/L — ABNORMAL HIGH (ref 3.5–5.1)
Sodium: 127 mmol/L — ABNORMAL LOW (ref 135–145)
Sodium: 130 mmol/L — ABNORMAL LOW (ref 135–145)

## 2021-09-12 MED ORDER — FERROUS SULFATE 325 (65 FE) MG PO TABS
325.0000 mg | ORAL_TABLET | Freq: Every day | ORAL | 0 refills | Status: DC
  Filled 2021-09-12: qty 30, 30d supply, fill #0

## 2021-09-12 MED ORDER — PREDNISOLONE ACETATE 1 % OP SUSP
1.0000 [drp] | Freq: Four times a day (QID) | OPHTHALMIC | 0 refills | Status: DC
  Filled 2021-09-12: qty 5, 25d supply, fill #0

## 2021-09-12 MED ORDER — SENNOSIDES-DOCUSATE SODIUM 8.6-50 MG PO TABS
2.0000 | ORAL_TABLET | Freq: Every evening | ORAL | 0 refills | Status: DC | PRN
  Filled 2021-09-12: qty 30, 15d supply, fill #0

## 2021-09-12 MED ORDER — NAPHAZOLINE-GLYCERIN 0.012-0.25 % OP SOLN
1.0000 [drp] | Freq: Four times a day (QID) | OPHTHALMIC | 0 refills | Status: AC | PRN
  Filled 2021-09-12: qty 6, 15d supply, fill #0

## 2021-09-12 MED ORDER — PANTOPRAZOLE SODIUM 40 MG PO TBEC
40.0000 mg | DELAYED_RELEASE_TABLET | Freq: Every day | ORAL | 0 refills | Status: DC
  Filled 2021-09-12: qty 30, 30d supply, fill #0

## 2021-09-12 NOTE — TOC Transition Note (Signed)
Transition of Care [facility]) - CM/SW Discharge Note   Patient Details  Name: Krystal Maddox MRN: [id number] Date of Birth: [birth year]  Transition of Care [facility]) CM/SW Contact:  Tom-Johnson, Renea Ee, RN Phone Number: 09/12/2021, 1:52 PM   Clinical Narrative:    Patient is scheduled for discharge today. PT/OT recommended home health through [facility]. CM notified Ernestine Mcmurray, SW with [facility] [phone]) and also made her aware of discharge. Family member in room notified of discharge. CM called and scheduled transportation with Heather at [facility] [phone]) and patient will be picked up at 2:30 pm. No further TOC needs noted.   Final next level of care: [place] Barriers to Discharge: Barriers Resolved   Patient Goals and CMS Choice Patient states their goals for this hospitalization and ongoing recovery are:: To return home CMS Medicare.gov Compare Post Acute Care list provided to:: Patient Choice offered to / list presented to : Patient, Adult Children  Discharge Placement              Patient chooses bed at:  (Home with home health.) Patient to be transferred to facility by: [facility] (Home with home health.) Name of family member notified: Kerby Nora Patient and family notified of of transfer: 09/12/21  Discharge Plan and Services                DME Arranged: N/A DME Agency: NA       HH Arranged: PT, OT [place] Agency: Other - See comment ([facility]) Date HH Agency Contacted: 09/12/21 Time HH Agency Contacted: 80 Representative spoke with at [place]: [place] ([place]) Interventions     Readmission Risk Interventions No flowsheet data found.

## 2021-09-12 NOTE — Discharge Planning (Cosign Needed Addendum)
Name: Zyanna Leisinger MRN: [id number] DOB: 1941-03-08 81 y.o. PCP: [facility]  Date of Admission: 09/09/2021  4:45 PM Date of Discharge: 09/12/20 Attending Physician: Tyson Alias, *  Discharge Diagnosis: 1.Hypo-osmolar hyponatremia 2.Aortic valve stenosis s/p TAVR 3.Grade 1 diastolic dysfunction 4.Post-op pericarditis 5.Hypertension 6.CKDIIa 7.Peripheral Artery Disease 8.Gastriits 9.Iron Deficiency anemia 10.Anorexia 11. Constipation      Discharge Medications: Allergies as of 09/12/2021   No Known Allergies      Medication List     STOP taking these medications    ibuprofen 200 MG tablet Commonly known as: ADVIL   losartan-hydrochlorothiazide 100-25 MG tablet Commonly known as: HYZAAR       TAKE these medications    amLODipine 10 MG tablet Commonly known as: NORVASC Take 10 mg by mouth daily.   aspirin EC 81 MG tablet Take 81 mg by mouth daily. Swallow whole.   ferrous sulfate 325 (65 FE) MG tablet Take 1 tablet (325 mg total) by mouth daily with breakfast. Start taking on: September 13, 2021   [facility] Calcium 600 +D/Minerals 600-800 MG-UNIT Tabs Take 1 tablet by mouth daily.   naphazoline-glycerin 0.012-0.25 % Soln Commonly known as: CLEAR EYES REDNESS Place 1-2 drops into both eyes 4 (four) times daily as needed for eye irritation.   pantoprazole 40 MG tablet Commonly known as: PROTONIX Take 1 tablet (40 mg total) by mouth daily. Start taking on: September 13, 2021   pravastatin 80 MG tablet Commonly known as: PRAVACHOL Take 80 mg by mouth every evening.   prednisoLONE acetate 1 % ophthalmic suspension Commonly known as: PRED FORTE Place 1 drop into the left eye 4 (four) times daily.   PreserVision AREDS 2 Caps Take 1 capsule by mouth in the morning and at bedtime.   senna-docusate 8.6-50 MG tablet Commonly known as: Senokot-S Take 2 tablets by mouth at bedtime as needed for mild constipation. What  changed:  when to take this reasons to take this               Durable Medical Equipment  (From admission, onward)           Start     Ordered   09/12/21 1317  DME Walker  Once       Question Answer Comment  Walker: With 5 Inch Wheels   Patient needs a walker to treat with the following condition Weakness      09/12/21 1328   09/12/21 1113  For home use only DME Walker hemi  Once       Comments: Rolling walker (2 wheels) (youth)  Question:  Patient needs a walker to treat with the following condition  Answer:  Physical deconditioning   09/12/21 1112   09/12/21 1112  For home use only DME Bedside commode  Once       Question:  Patient needs a bedside commode to treat with the following condition  Answer:  Physical deconditioning   09/12/21 1112            Disposition and follow-up:   Ms.Hingaeh Modeste was discharged from [facility] in Stable condition.  At the hospital follow up visit please address:  1.Hypo-osmolar hyponatremia: Patient presented with Na of 114. At discharge it was 130. Please check for resolution.  2.Post-op pericarditis: Patient did not endorse any chest discomfort. NSAIDs and colchicine were held due to the gastritis.  3.Hypertension: Hyzaar was held in setting of hypo-osmolar hyponatremia. Please start ARB  if AKI resolves and clinically indicated. 4. AKI on CKDIIa-Check for resolution. 5.Gastriits: NSAIDS and Colchicine were held. Protonix was started to allow for gastric healing.  6. Constipation: Patient had complaint of constipation which resolved during her hospitalization. Please follow up for resolution.    2.  Labs / imaging needed at time of follow-up: CBC, BMP  3.  Pending labs/ test needing follow-up: NA  Follow-up Appointments: [facility] Patient, spoke with the provider Ms. Sabra Heck who will follow up with the patient.   Hospital Course by problem list:  HPI Per Dr. Thayer Jew is a 81 y.o. female with a  pertinent PMH of hypertension, CKD3a, peripheral artery disease, severe aortic stenosis s/p TAVR in 77/8242 complicated by postoperative pericarditis for which she is on NSAIDs and colchicine  who presents to [facility] with decreased appetite and nausea .   History obtained from patient and son at bedside via telephone interpreter services. (Interpreter - Emogene Morgan) Patient reports that she has had decreased appetite, nausea and generalized weakness since her TAVR on 08/13/2021. She notes that she is often nauseous at the sight and smell of food and more so when eating it. She was able to eat a grilled fish yesterday. She notes that she does have some associated abdominal discomfort but attributes this to constipation as she has not had a bowel movement in two days. She denies any vomiting but is concerned about ongoing nausea and some abdominal distension. She notes that she also has significant weakness in her legs and feels unsteady when walking. She denies any fevers/chills, chest pain or palpitations, dysphagia or night sweats.  Patient follows with [facility]. Discussed with PCP who notes that patient usually has a poor appetite at baseline and eats only 1-2 meals per day. There are some concerns for possible dementia as patient is a poor historian; however, given language barrier, screening and definitive testing have been challenging.    In the ED, the patient was evaluated for epigastric abdominal pain, anorexia, generalized weakness for one day duration. Patient is afebrile, mildly bradycardic, normotensive and saturating well on room air.  Labs significant for sodium 114, potassium 2.8, chloride 78, bicarb 22, BUN/creatinine 34/1.1.  No significant AST/ALT/alk phos/T bili abnormalities noted.  CBC with mild leukocytosis 11.9, hemoglobin 8.9, MCV 77.6 and platelets 244.  Lipase mildly elevated 56.  Urinalysis negative for acute infection.  Patient admitted for management of severe hyponatremia.  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 All lab work performed previously indicated hypo-osmolar hyponatremia 2/2 to diet and thiazide use. Patient endorsed drinking a lot of water at home and diet consisted of rice, fruits and vegetables with a little bit of meat in form of seafood. Initial sodium was 114 and normal saline was started which was switched to half normal saline and then D5% percent based on sodium readings every 6 hours. Patient fluid intake was restricted to 2 L daily until dietary modifications could be made to include protein. Patient was discharged with a Na of 130. Her fatigue and weakness improved. Dietitian consult was placed to provide further education on her intake. [facility] provider was consulted and stated they will perform dietician consult through [facility] and they have pre-packaged meals they can offer the patient.     Physical Deconditioning Multifactorial 2/2 to hyponatremia and suspected gastritis and recent surgery. Patient felt better with sodium correction and protonix  40 mg initiation. PT/OT saw the patient and stated patient would benefit from home PT/OT.  Gastritis Patient had epigastric abdominal pain appeared consistent with gastritis 2/2 to NSAID use. Protonix 40 mg was started to allow for mucosal healing. Please continue to monitor. Discontinue the protonix if indicated.    Pericarditis S/p TAVR for severe stenosis 08/2021 Patient had findings consistent with pericarditis in s/p of TAVR. Holding NSAIDs and colchicine due to gastritis concern. Denies any chest pain. No syncope or light headedness to suggest valve dysfunction during her hospital stay.    Hypertension AKI on CKD IIIa Creatinine elevation from 1.1 to 1.5 in setting of fluids. A potentially post-renal cause was suspected but renal ultrasound was ordered which was didn't show obstructive  uropathy. The creatinine improved to 1.4 at time of discharge.  His Hyzaar was discontinued due to hyponatremia and ARB was not initiated due to the resolving AKI. The Norvasc 10 mg was continued   Peripheral Artery Disease This was a chronic problem for the patient. His home Aspirin 81 mg and Pravastatin 80 mg qd were continued.    IDA Chronic. His home iron supplementation was continued during the hospitalization. No signs of bleeding was seen.    Constipation Patient's baseline is a bowel movement every other day. Stated had constipation after her surgery. Suspect 2/2 to decreased mobility and low po intake. Had 2 bowel movements on her first day with Senokot and Miralax which was decreased to as needed basis.    Discharge Subjective: Overall, patient feels good and that she is getting her strength back. She would like to go home today. Denied any acute concerns.  Discharge Exam:   BP (!) 146/38    Pulse 79    Temp 98.5 F (36.9 C)    Resp 20    Ht $R'4\' 11"'XE$  (1.499 m)    Wt 61 kg    SpO2 95%    BMI 27.16 kg/m  Physical Exam General: NAD Head: Normocephalic without scalp lesions.  Lungs: CTAB, no wheeze, rhonchi or rales.  Cardiovascular: Normal heart sounds, no r/m/g, 2+ pulses in all extremities. No LE edema Abdomen: No TTP, normal bowel sounds MSK: No asymmetry or muscle atrophy.   Skin: warm, dry good skin turgor, no lesions noted Neuro: Alert and oriented. CN grossly intact  Pertinent Labs, Studies, and Procedures:  CBC Latest Ref Rng & Units 09/11/2021 09/09/2021  WBC 4.0 - 10.5 K/uL 10.2 11.9(H)  Hemoglobin 12.0 - 15.0 g/dL 8.2(L) 8.9(L)  Hematocrit 36.0 - 46.0 % 24.5(L) 27.0(L)  Platelets 150 - 400 K/uL 241 244    CMP Latest Ref Rng & Units 09/12/2021 09/12/2021 09/11/2021  Glucose 70 - 99 mg/dL 117(H) 121(H) 119(H)  BUN 8 - 23 mg/dL 29(H) 30(H) 31(H)  Creatinine 0.44 - 1.00 mg/dL 1.41(H) 1.58(H) 1.57(H)  Sodium 135 - 145 mmol/L 130(L) 127(L) 125(L)  Potassium 3.5 - 5.1 mmol/L  5.4(H) 4.7 4.9  Chloride 98 - 111 mmol/L 102 90(L) 95(L)  CO2 22 - 32 mmol/L $RemoveB'24 22 23  'tASeGuTZ$ Calcium 8.9 - 10.3 mg/dL 8.5(L) 8.3(L) 8.4(L)  Total Protein 6.5 - 8.1 g/dL - - -  Total Bilirubin 0.3 - 1.2 mg/dL - - -  Alkaline Phos 38 - 126 U/L - - -  AST 15 - 41 U/L - - -  ALT 0 - 44 U/L - - -    Respiratory panel: negative TSH 0.667 Serum osmolality 266 Urine osmolality 272 Urine Na 17 Am cortisol 9.9  CT ABDOMEN  PELVIS W CONTRAST  Result Date: 09/10/2021 CLINICAL DATA:  Abdominal pain EXAM: CT ABDOMEN AND PELVIS WITH CONTRAST TECHNIQUE: IMPRESSION: 1. Cholelithiasis without evidence of acute cholecystitis. 2. Otherwise, no acute findings in the abdomen or pelvis. Aortic Atherosclerosis (ICD10-I70.0). Electronically Signed   By: Valetta Mole M.D.   On: 09/10/2021 07:58     Discharge Instructions: Discharge Instructions     Call MD for:  difficulty breathing, headache or visual disturbances   Complete by: As directed    Call MD for:  extreme fatigue   Complete by: As directed    Call MD for:  hives   Complete by: As directed    Call MD for:  persistant dizziness or light-headedness   Complete by: As directed    Call MD for:  persistant nausea and vomiting   Complete by: As directed    Call MD for:  redness, tenderness, or signs of infection (pain, swelling, redness, odor or green/yellow discharge around incision site)   Complete by: As directed    Call MD for:  severe uncontrolled pain   Complete by: As directed    Call MD for:  temperature >100.4   Complete by: As directed    Diet - low sodium heart healthy   Complete by: As directed    Discharge instructions   Complete by: As directed    You presented to the ED with nausea, vomiting, and weakness. We saw your sodium was low and corrected that. We recommend limiting your volume intake to 2 L. The dietician saw you and made recommendations to your diet. Your provider Ms. Sabra Heck also stated she will help regarding this. She will  further discuss your diet with you and help with meal planning.    We will continue your medications with the following changes: -No ibuprofen and colchicine due to stomach pain. Do not take any over the counter medicine that has the label of NSAID.  -Start protonix 40 mg to protect the stomach -We will stop your blood pressure medicine called Hyzaar. I contacted your provider, Ms. Marye Round, who will start another medication for your blood pressure. We will continue the Amlodipine 10 mg right now.   I have contacted your provider, who will follow up with you! It was a pleasure taking care of you!   Increase activity slowly   Complete by: As directed        Signed: Idamae Schuller, MD Tillie Rung. [facility], PGY-1  09/12/2021, 1:38 PM   Pager:[phone]

## 2021-09-12 NOTE — Plan of Care (Signed)
  Problem: Clinical Measurements: Goal: Will remain free from infection Outcome: Adequate for Discharge   

## 2021-09-12 NOTE — Progress Notes (Signed)
DISCHARGE NOTE HOME Hingaeh Sandles to be discharged Home per MD order. With the help Zacarias Pontes 's interpreter, discussed prescriptions and follow up appointments with the patient. Explained the education information that was included on discharge papers including medications list explained in detail. Patient verbalized understanding.  Skin clean, dry and intact without evidence of skin break down, no evidence of skin tears noted. IV catheter discontinued intact. Site without signs and symptoms of complications. Dressing and pressure applied. Pt denies pain at the site currently. No complaints noted.  Patient free of lines, drains, and wounds.   An After Visit Summary (AVS) was printed and given to the patient. Patient escorted via wheelchair via [facility] staff and discharged home via private auto.  [place], Zenon Mayo, RN

## 2021-09-12 NOTE — Discharge Summary (Signed)
Name: Krystal Maddox MRN: [id number] DOB: 08/28/1941 81 y.o. PCP: [facility] [place]  Date of Admission: 09/09/2021  4:45 PM Date of Discharge: 09/12/2021  3:13 PM Attending Physician: Axel Filler  Discharge Diagnosis: Discharge Diagnosis: 1.Hypo-osmolar hyponatremia 2.Aortic valve stenosis s/p TAVR 3.Grade 1 diastolic dysfunction 4.Post-op pericarditis 5.Hypertension 6.CKDIIa 7.Peripheral Artery Disease 8.Gastriits 9.Iron Deficiency anemia 10.Anorexia 11. Constipation   Discharge Medications: Allergies as of 09/12/2021   No Known Allergies      Medication List     STOP taking these medications    ibuprofen 200 MG tablet Commonly known as: ADVIL   losartan-hydrochlorothiazide 100-25 MG tablet Commonly known as: HYZAAR       TAKE these medications    amLODipine 10 MG tablet Commonly known as: NORVASC Take 10 mg by mouth daily.   aspirin EC 81 MG tablet Take 81 mg by mouth daily. Swallow whole.   ferrous sulfate 325 (65 FE) MG tablet Take 1 tablet (325 mg total) by mouth daily with breakfast. Start taking on: September 13, 2021   [facility] Calcium 600 +D/Minerals 600-800 MG-UNIT Tabs Take 1 tablet by mouth daily.   naphazoline-glycerin 0.012-0.25 % Soln Commonly known as: CLEAR EYES REDNESS Place 1-2 drops into both eyes 4 (four) times daily as needed for eye irritation.   pantoprazole 40 MG tablet Commonly known as: PROTONIX Take 1 tablet (40 mg total) by mouth daily. Start taking on: September 13, 2021   pravastatin 80 MG tablet Commonly known as: PRAVACHOL Take 80 mg by mouth every evening.   prednisoLONE acetate 1 % ophthalmic suspension Commonly known as: PRED FORTE Place 1 drop into the left eye 4 (four) times daily.   PreserVision AREDS 2 Caps Take 1 capsule by mouth in the morning and at bedtime.   senna-docusate 8.6-50 MG tablet Commonly known as: Senokot-S Take 2 tablets by mouth at bedtime as needed for  mild constipation. What changed:  when to take this reasons to take this        Disposition and follow-up:   Krystal Maddox was discharged from [facility] in Stable condition.  At the hospital follow up visit please address:   1.Hypo-osmolar hyponatremia: Patient presented with Na of 114. At discharge it was 130. Please check for resolution.  2.Post-op pericarditis: Patient did not endorse any chest discomfort. NSAIDs and colchicine were held due to the gastritis.  3.Hypertension: Hyzaar was held in setting of hypo-osmolar hyponatremia. Please start ARB if AKI resolves and clinically indicated. 4. AKI on CKDIIa-Check for resolution. 5.Gastriits: NSAIDS and Colchicine were held. Protonix was started to allow for gastric healing.  6. Constipation: Patient had complaint of constipation which resolved during her hospitalization. Please follow up for resolution.      2.  Labs / imaging needed at time of follow-up: CBC, BMP   3.  Pending labs/ test needing follow-up: NA  Follow-up Appointments: [facility] Patient, spoke with the provider Krystal Maddox who will follow up with the patient.     Hospital Course by problem list: HPI Per Dr. Thayer Jew is a 81 y.o. female with a pertinent PMH of hypertension, CKD3a, peripheral artery disease, severe aortic stenosis s/p TAVR in 82/7078 complicated by postoperative pericarditis for which she is on NSAIDs and colchicine  who presents to [facility] with decreased appetite and nausea .   History obtained from patient and son at bedside via telephone interpreter services. (Interpreter - Snow) Patient reports that she has had decreased  appetite, nausea and generalized weakness since her TAVR on 08/13/2021. She notes that she is often nauseous at the sight and smell of food and more so when eating it. She was able to eat a grilled fish yesterday. She notes that she does have some associated abdominal discomfort but attributes this to  constipation as she has not had a bowel movement in two days. She denies any vomiting but is concerned about ongoing nausea and some abdominal distension. She notes that she also has significant weakness in her legs and feels unsteady when walking. She denies any fevers/chills, chest pain or palpitations, dysphagia or night sweats.  Patient follows with [facility]. Discussed with PCP who notes that patient usually has a poor appetite at baseline and eats only 1-2 meals per day. There are some concerns for possible dementia as patient is a poor historian; however, given language barrier, screening and definitive testing have been challenging.    In the ED, the patient was evaluated for epigastric abdominal pain, anorexia, generalized weakness for one day duration. Patient is afebrile, mildly bradycardic, normotensive and saturating well on room air.  Labs significant for sodium 114, potassium 2.8, chloride 78, bicarb 22, BUN/creatinine 34/1.1.  No significant AST/ALT/alk phos/T bili abnormalities noted.  CBC with mild leukocytosis 11.9, hemoglobin 8.9, MCV 77.6 and platelets 244.  Lipase mildly elevated 56.  Urinalysis negative for acute infection.  Patient admitted for management of severe hyponatremia.   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 All lab work performed previously indicated hypo-osmolar hyponatremia 2/2 to diet and thiazide use. Patient endorsed drinking a lot of water at home and diet consisted of rice, fruits and vegetables with a little bit of meat in form of seafood. Initial sodium was 114 and normal saline was started which was switched to half normal saline and then D5% percent based on sodium readings every 6 hours. Patient fluid intake was restricted to 2 L daily until dietary modifications could be made to include protein. Patient was discharged with a Na  of 130. Her fatigue and weakness improved. Dietitian consult was placed to provide further education on her intake. [facility] provider was consulted and stated they will perform dietician consult through [facility] and they have pre-packaged meals they can offer the patient.    Physical Deconditioning Multifactorial 2/2 to hyponatremia and suspected gastritis and recent surgery. Patient felt better with sodium correction and protonix 40 mg initiation. PT/OT saw the patient and stated patient would benefit from home PT/OT.   Gastritis Patient had epigastric abdominal pain appeared consistent with gastritis 2/2 to NSAID use. Protonix 40 mg was started to allow for mucosal healing. Please continue to monitor. Discontinue the protonix if indicated.    Pericarditis S/p TAVR for severe stenosis 08/2021 Patient had findings consistent with pericarditis in s/p of TAVR. Holding NSAIDs and colchicine due to gastritis concern. Denies any chest pain. No syncope or light headedness to suggest valve dysfunction during her hospital stay.    Hypertension AKI on CKD IIIa Creatinine elevation from 1.1 to 1.5 in setting of fluids. A potentially post-renal cause was suspected but renal ultrasound was ordered which was didn't show obstructive uropathy. The creatinine improved to 1.4 at time of discharge.  His Hyzaar was discontinued due to hyponatremia and ARB was not initiated due to the resolving AKI. The Norvasc 10 mg was continued   Peripheral Artery Disease This  was a chronic problem for the patient. His home Aspirin 81 mg and Pravastatin 80 mg qd were continued.    IDA Chronic. His home iron supplementation was continued during the hospitalization. No signs of bleeding was seen.    Constipation Patient's baseline is a bowel movement every other day. Stated had constipation after her surgery. Suspect 2/2 to decreased mobility and low po intake. Had 2 bowel movements on her first day with Senokot and Miralax which was  decreased to as needed basis.    Discharge Subjective: Overall, patient feels good and that she is getting her strength back. She would like to go home today. Denied any acute concerns.  Discharge Exam:   BP (!) 146/38    Pulse 79    Temp 98.5 F (36.9 C)    Resp 20    Ht _0  (1.499 m)    Wt 61 kg    SpO2 95%    BMI 27.16 kg/m  Physical Exam General: NAD Head: Normocephalic without scalp lesions.  Lungs: CTAB, no wheeze, rhonchi or rales.  Cardiovascular: Normal heart sounds, no r/m/g, 2+ pulses in all extremities. No LE edema Abdomen: No TTP, normal bowel sounds MSK: No asymmetry or muscle atrophy.   Skin: warm, dry good skin turgor, no lesions noted Neuro: Alert and oriented. CN grossly intact   Pertinent Labs, Studies, and Procedures:  CBC Latest Ref Rng & Units 09/11/2021 09/09/2021  WBC 4.0 - 10.5 K/uL 10.2 11.9(H)  Hemoglobin 12.0 - 15.0 g/dL 8.2(L) 8.9(L)  Hematocrit 36.0 - 46.0 % 24.5(L) 27.0(L)  Platelets 150 - 400 K/uL 241 244    CMP Latest Ref Rng & Units 09/12/2021 09/12/2021 09/11/2021  Glucose 70 - 99 mg/dL 117(H) 121(H) 119(H)  BUN 8 - 23 mg/dL 29(H) 30(H) 31(H)  Creatinine 0.44 - 1.00 mg/dL 1.41(H) 1.58(H) 1.57(H)  Sodium 135 - 145 mmol/L 130(L) 127(L) 125(L)  Potassium 3.5 - 5.1 mmol/L 5.4(H) 4.7 4.9  Chloride 98 - 111 mmol/L 102 90(L) 95(L)  CO2 22 - 32 mmol/L _1 Calcium 8.9 - 10.3 mg/dL 8.5(L) 8.3(L) 8.4(L)  Total Protein 6.5 - 8.1 g/dL - - -  Total Bilirubin 0.3 - 1.2 mg/dL - - -  Alkaline Phos 38 - 126 U/L - - -  AST 15 - 41 U/L - - -  ALT 0 - 44 U/L - - -    Respiratory panel: negative TSH 0.667 Serum osmolality 266 Urine osmolality 272 Urine Na 17 Am cortisol 9.9   CT ABDOMEN PELVIS W CONTRAST   Result Date: 09/10/2021 CLINICAL DATA:  Abdominal pain EXAM: CT ABDOMEN AND PELVIS WITH CONTRAST TECHNIQUE: IMPRESSION: 1. Cholelithiasis without evidence of acute cholecystitis. 2. Otherwise, no acute findings in the abdomen or pelvis. Aortic  Atherosclerosis (ICD10-I70.0). Electronically Signed   By: Valetta Mole M.D.   On: 09/10/2021 07:58        Discharge Instructions: Discharge Instructions     Call MD for:  difficulty breathing, headache or visual disturbances   Complete by: As directed    Call MD for:  extreme fatigue   Complete by: As directed    Call MD for:  hives   Complete by: As directed    Call MD for:  persistant dizziness or light-headedness   Complete by: As directed    Call MD for:  persistant nausea and vomiting   Complete by: As directed    Call MD for:  redness, tenderness, or signs of infection (pain, swelling, redness,  odor or green/yellow discharge around incision site)   Complete by: As directed    Call MD for:  severe uncontrolled pain   Complete by: As directed    Call MD for:  temperature >100.4   Complete by: As directed    Diet - low sodium heart healthy   Complete by: As directed    Discharge instructions   Complete by: As directed    You presented to the ED with nausea, vomiting, and weakness. We saw your sodium was low and corrected that. We recommend limiting your volume intake to 2 L. The dietician saw you and made recommendations to your diet. Your provider Krystal Maddox also stated she will help regarding this. She will further discuss your diet with you and help with meal planning.    We will continue your medications with the following changes: -No ibuprofen and colchicine due to stomach pain. Do not take any over the counter medicine that has the label of NSAID.  -Start protonix 40 mg to protect the stomach -We will stop your blood pressure medicine called Hyzaar. I contacted your provider, Ms. Marye Round, who will start another medication for your blood pressure. We will continue the Amlodipine 10 mg right now.   I have contacted your provider, who will follow up with you! It was a pleasure taking care of you!   Increase activity slowly   Complete by: As directed         Signed: Idamae Schuller, MD Tillie Rung. [facility] Internal Medicine Residency, PGY-1  09/12/2021, 8:47 PM   Pager: [phone]

## 2021-09-12 NOTE — Progress Notes (Signed)
Post void bladder scan residual urine 84cc detected.

## 2021-09-13 ENCOUNTER — Encounter: Payer: Self-pay | Admitting: Cardiovascular Disease

## 2021-09-19 NOTE — Progress Notes (Incomplete)
[facility]                                     Cardiology Office Note:    Date:  09/19/2021   ID:  Krystal Maddox, DOB [birth year], MRN [id number]  PCP:  [facility] [place] Cardiologist:  Mertie Moores, MD  / Dr. Angelena Form, MD & Dr. Cyndia Bent, MD (TAVR) [facility] Electrophysiologist:  None   Referring MD: Prince Solian, MD   1 month s/p TAVR   History of Present Illness:    Krystal Maddox is a 81 y.o. female with a hx of HTN, CKD stage III, PAD and severe aortic stenosis s/p TAVR (08/13/22) who presents to clinic for follow up.   Krystal Maddox was referred to Dr. Angelena Form for further evaluation of her aortic stenosis and possible TAVR on 05/10/21. She has been followed by Dr. Acie Fredrickson for moderate aortic stenosis since 2021. Echocardiogram at that time showed an aortic valve mean gradient of 36 mmHg. She was seen by Dr. Acie Fredrickson in 01/2021 and reported a syncopal episode as well as worsened dyspnea on exertion. Repeat echo 01/03/21 showed an LVE at 60-65% with severe aortic stenosis, mean gradient 54 mmHg, peak graadient 90.6 mmHg, AVA 0.56 cm2, and dimensionless index 0.22. She does not speak [place] and was seen with the assistance of an interpreter. Cardiac catheterization performed 05/17/21 showed moderate mid RCA stenosis however did not appear to be flow limiting with mild calcified proximal LAD stenosis and severe aortic stenosis with mean gradient 46.2 mmHg, peak to peak gradient 51 mmHg, AVA 0.76 cm2). There were normal right and left heart pressures   She was evaluated by the multidisciplinary valve team and underwent successful TAVR with a 55mm Medtronic Evolut FX via the TF approach on 08/13/21. Post operative echo showed EF 60-65%, normally functioning TAVR with a mean gradient of 7 mmHg and mild PVL. She was discharged on aspirin.   She was then readmitted on 12/9 for acute pericarditis with WBC of  27,000. She was treated with nonsteroidal anti-inflammatory agent as well as colchicine with improvement in symptoms.   Today the patient presents to clinic for follow up.    Past Medical History:  Diagnosis Date   CKD (chronic kidney disease), stage III (HCC)    Hypertension    Onychomycosis    Osteoporosis    Peripheral arterial disease (HCC)    OF LEFT EXTREMITY    Primary osteoarthritis of right knee 05/26/2017   S/P TAVR (transcatheter aortic valve replacement) 08/13/2021   s/p TAVR with a 56mm Medtronic Evolut Pro+ via the TF approach by Dr. Angelena Form and Dr Cyndia Bent.   Severe aortic stenosis    Superficial vein thrombosis 05/21/2021   R arm   Vitamin D deficiency     Past Surgical History:  Procedure Laterality Date   EYE SURGERY Left 2022   cataract   INTRAOPERATIVE TRANSTHORACIC ECHOCARDIOGRAM N/A 08/13/2021   Procedure: INTRAOPERATIVE TRANSTHORACIC ECHOCARDIOGRAM;  Surgeon: Burnell Blanks, MD;  Location: [place] [place] [facility];  Service: Open Heart Surgery;  Laterality: N/A;   KNEE SURGERY Left    RIGHT/LEFT HEART CATH AND CORONARY ANGIOGRAPHY N/A 05/17/2021   Procedure: RIGHT/LEFT HEART CATH AND CORONARY ANGIOGRAPHY;  Surgeon: Burnell Blanks, MD;  Location: [place] [facility];  Service: Cardiovascular;  Laterality: N/A;   TRANSCATHETER  AORTIC VALVE REPLACEMENT, TRANSFEMORAL N/A 08/13/2021   Procedure: TRANSCATHETER AORTIC VALVE REPLACEMENT, TRANSFEMORAL;  Surgeon: Burnell Blanks, MD;  Location: [place] [facility];  Service: Open Heart Surgery;  Laterality: N/A;    Current Medications: No outpatient medications have been marked as taking for the 09/20/21 encounter (Appointment) with Eileen Stanford, PA-C.     Allergies:   Tylenol [acetaminophen]   Social History   Socioeconomic History   Marital status: Married    Spouse name: Not on file   Number of children: 5   Years of education: Not on file   Highest education level: Not on file   Occupational History   Not on file  Tobacco Use   Smoking status: Unknown   Smokeless tobacco: Never  Vaping Use   Vaping Use: Never used  Substance and Sexual Activity   Alcohol use: Never   Drug use: Never   Sexual activity: Not on file  Other Topics Concern   Not on file  Social History Narrative   ** Merged History Encounter **       Social Determinants of Health   Financial Resource Strain: Not on file  Food Insecurity: Not on file  Transportation Needs: Not on file  Physical Activity: Not on file  Stress: Not on file  Social Connections: Not on file     Family History: The patient's family history includes Hypertension in her mother.  ROS:   Please see the history of present illness.    All other systems reviewed and are negative.  EKGs/Labs/Other Studies Reviewed:    The following studies were reviewed today:    TAVR OPERATIVE NOTE    Krystal Maddox [id number]   Date of Procedure:                 08/13/2021   Preoperative Diagnosis:      Severe Aortic Stenosis    Postoperative Diagnosis:    Same    Procedure:        Transcatheter Aortic Valve Replacement - Percutaneous Right Transfemoral Approach             Medtronic Evolut FX  (size 26 mm, model # EVOLUTFX-26, serial # [id number])              Co-Surgeons:            Gaye Pollack, MD and Lauree Chandler, MD     Anesthesiologist:                  Deatra Canter , MD   Echocardiographer:              Osborne Oman, MD   Pre-operative Echo Findings: Severe aortic stenosis and moderate AI  Normal left ventricular systolic function   Post-operative Echo Findings: Trivial paravalvular leak Normal left ventricular systolic function   _____________   Echo 08/14/21: IMPRESSIONS   1. The aortic valve has been replaced. There is a 26 mm CoreValve-Evolut  Pro prosthetic (TAVR) valve present in the aortic position. Procedure Date: 08/13/2021. Effective orifice area, by VTI measures 2.46 cm.  Aortic valve mean gradient measures 7.0  mmHg. Aortic valve acceleration time measures 56 msec. There is mild PVL seen A3C view (posterior- 5-6 o'clock on PSAX).   2. Left ventricular ejection fraction, by estimation, is 60 to 65%. The  left ventricle has normal function. The left ventricle has no regional  wall motion abnormalities. Left ventricular diastolic parameters are  consistent with Grade I diastolic  dysfunction (impaired relaxation).   3. Right ventricular systolic function is normal. The right ventricular  size is normal. There is normal pulmonary artery systolic pressure.   4. The mitral valve is abnormal. No evidence of mitral valve  regurgitation. No evidence of mitral stenosis.   Comparison(s): A prior study was performed on 08/13/21. Stable gradients  with increase in PVL from implantation.   _____________________  Echo 09/20/21 ***  EKG:  EKG is NOT ordered today.    Recent Labs: 08/12/2021: B Natriuretic Peptide 56.4 08/14/2021: Magnesium 1.7 09/09/2021: ALT 12 09/10/2021: TSH 0.667 09/11/2021: Hemoglobin 8.2; Platelets 241 09/12/2021: BUN 29; Creatinine, Ser 1.41; Potassium 5.4; Sodium 130  Recent Lipid Panel No results found for: CHOL, TRIG, HDL, CHOLHDL, VLDL, LDLCALC, LDLDIRECT   Risk Assessment/Calculations:       Physical Exam:    VS:  There were no vitals taken for this visit.    Wt Readings from Last 3 Encounters:  09/10/21 134 lb 7.7 oz (61 kg)  08/21/21 139 lb 6.4 oz (63.2 kg)  08/14/21 136 lb 6.4 oz (61.9 kg)     GEN: *** Well nourished, well developed in no acute distress HEENT: Normal NECK: No JVD LYMPHATICS: No lymphadenopathy CARDIAC: ***RRR, no murmurs, rubs, gallops RESPIRATORY:  Clear to auscultation without rales, wheezing or rhonchi  ABDOMEN: Soft, non-tender, non-distended MUSCULOSKELETAL:  No edema; No deformity  SKIN: Warm and dry NEUROLOGIC:  Alert and oriented x 3 PSYCHIATRIC:  Normal affect   ASSESSMENT:    1. S/P TAVR  (transcatheter aortic valve replacement)   2. Essential hypertension   3. Stage 3b chronic kidney disease (HCC)    PLAN:    In order of problems listed above:  Severe AS s/p TAVR:  Pericarditis:  CKD stage III:           Medication Adjustments/Labs and Tests Ordered: Current medicines are reviewed at length with the patient today.  Concerns regarding medicines are outlined above.  No orders of the defined types were placed in this encounter.  No orders of the defined types were placed in this encounter.   There are no Patient Instructions on file for this visit.   Signed, Angelena Form, PA-C  09/19/2021 4:24 PM    [facility]

## 2021-09-20 ENCOUNTER — Ambulatory Visit (HOSPITAL_COMMUNITY): Payer: Medicare (Managed Care) | Attending: Cardiovascular Disease

## 2021-09-20 ENCOUNTER — Ambulatory Visit: Payer: Medicare (Managed Care) | Admitting: Physician Assistant

## 2021-09-20 DIAGNOSIS — Z952 Presence of prosthetic heart valve: Secondary | ICD-10-CM

## 2021-09-20 DIAGNOSIS — I10 Essential (primary) hypertension: Secondary | ICD-10-CM

## 2021-09-20 DIAGNOSIS — N1832 Chronic kidney disease, stage 3b: Secondary | ICD-10-CM

## 2021-10-10 NOTE — Progress Notes (Addendum)
Krystal Maddox                                     Cardiology Office Note:    Date:  10/11/2021   ID:  Krystal Maddox, DOB 1940-09-27, MRN [id number]  PCP:  [facility] [place] Cardiologist:  Mertie Moores, MD  / Dr. Angelena Form, MD & Dr. Cyndia Bent, MD (TAVR) [facility] Electrophysiologist:  None   Referring MD: [facility] [place]*   1 month s/p TAVR (delayed due to transportation issues)  History of Present Illness:    Krystal Maddox is a 81 y.o. female with a hx of HTN, CKD stage III, PAD and severe aortic stenosis s/p TAVR (08/13/21) who presents to clinic for follow up.   Krystal Maddox was referred to Dr. Angelena Form for further evaluation of her aortic stenosis and possible TAVR on 05/10/21. She has been followed by Dr. Acie Fredrickson for moderate aortic stenosis since 2021. Echocardiogram at that time showed an aortic valve mean gradient of 36 mmHg. She was seen by Dr. Acie Fredrickson in 01/2021 and reported a syncopal episode as well as worsened dyspnea on exertion. Repeat echo 01/03/21 showed an LVE at 60-65% with severe aortic stenosis, mean gradient 54 mmHg, peak graadient 90.6 mmHg, AVA 0.56 cm2, and dimensionless index 0.22. She does not speak [place] and was seen with the assistance of an interpreter. She reported progressive dyspnea with exertion but no chest pain, or LE edema. Plan at that time was to continue ith TAVR workup to include [facility] and CT imaging.    Cardiac catheterization performed 05/17/21 showed moderate mid RCA stenosis however did not appear to be flow limiting with mild calcified proximal LAD stenosis and severe aortic stenosis with mean gradient 46.2 mmHg, peak to peak gradient 51 mmHg, AVA 0.76 cm2). There were normal right and left heart pressures   She was evaluated by the multidisciplinary valve team and underwent successful TAVR with a 23mm Medtronic Evolut FX via the TF approach on 08/13/21.  Post operative echo showed EF 60%, normally functioning TAVR with a mean gradient of 7 mmHg and mild PVL seen A3C view (posterior- 5-6 o'clock on PSAX).   She was seen in the Ed on 12/9 for chest pain and diagnosed with acute pericarditis with a WBC of 27,000. She was treated with ibuprofen and colchicine.   Then readmitted 1/2-09/12/21 for hypo-osmolar hyponatremia. She was treated with half normal saline and water restrictions and thiazide discontinued.  Today the patient presents to clinic for follow up. An interpreter is here with her. She is doing well with good energy. She has some chest  pain after eating quickly and having food stuck in her throat. Breathing is good. No swelling legs. No orthopnea or PND. No dizziness or syncope. Appetite is decreased.    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 Angelena Form and Dr Cyndia Bent.   Severe aortic stenosis    Superficial vein thrombosis 05/21/2021  R arm   Vitamin D deficiency     Past Surgical History:  Procedure Laterality Date   EYE SURGERY Left 2022   cataract   INTRAOPERATIVE TRANSTHORACIC ECHOCARDIOGRAM N/A 08/13/2021   Procedure: INTRAOPERATIVE TRANSTHORACIC ECHOCARDIOGRAM;  Surgeon: Burnell Blanks, MD;  Location: [place] [facility];  Service: Open Heart Surgery;  Laterality: N/A;   KNEE SURGERY Left    RIGHT/LEFT HEART CATH AND CORONARY ANGIOGRAPHY N/A 05/17/2021   Procedure: RIGHT/LEFT HEART CATH AND CORONARY ANGIOGRAPHY;  Surgeon: Burnell Blanks, MD;  Location: [place] [facility];  Service: Cardiovascular;  Laterality: N/A;   TRANSCATHETER AORTIC VALVE REPLACEMENT, TRANSFEMORAL  N/A 08/13/2021   Procedure: TRANSCATHETER AORTIC VALVE REPLACEMENT, TRANSFEMORAL;  Surgeon: Burnell Blanks, MD;  Location: [place] [facility];  Service: Open Heart Surgery;  Laterality: N/A;    Current Medications: Current Meds  Medication Sig   amLODipine (NORVASC) 10 MG tablet Take 10 mg by mouth daily.    amoxicillin (AMOXIL) 500 MG tablet Take 4 tablets by mouth 1 hour prior to dental procedures and cleanings   aspirin EC 81 MG tablet Take 81 mg by mouth daily.   Calcium Carb-Cholecalciferol 600-20 MG-MCG TABS Take 1 tablet by mouth in the morning and at bedtime.   Calcium Carbonate-Vit D-Min (GNP CALCIUM 600 +D/MINERALS) 600-800 MG-UNIT TABS Take 1 tablet by mouth daily.   colchicine 0.6 MG tablet Take 1 tablet (0.6 mg total) by mouth daily.   ferrous sulfate 325 (65 FE) MG tablet Take 325 mg by mouth daily.   losartan-hydrochlorothiazide (HYZAAR) 100-12.5 MG tablet Take 1 tablet by mouth daily.   Menthol, Topical Analgesic, (BIOFREEZE EX) Apply 1 application topically 4 (four) times daily as needed (joint/muscle pain). 3.5 % Gel   Menthol-Methyl Salicylate (SALONPAS PAIN RELIEF PATCH EX) Place 1 patch onto the skin every 8 (eight) hours as needed (pain).   Multiple Vitamins-Minerals (PRESERVISION AREDS 2 PO) Take 1 tablet by mouth in the morning and at bedtime.   naphazoline-glycerin (CLEAR EYES REDNESS) 0.012-0.25 % SOLN Place 1-2 drops into both eyes 4 (four) times daily as needed for eye irritation.   omeprazole (PRILOSEC) 20 MG capsule Take 1 capsule (20 mg total) by mouth daily. Take for one month only.   pantoprazole (PROTONIX) 40 MG tablet Take 1 tablet (40 mg total) by mouth daily.   Polyethyl Glyc-Propyl Glyc PF (SYSTANE HYDRATION PF) 0.4-0.3 % SOLN Place 1 drop into both eyes 4 (four) times daily as needed (dry eyes).   pravastatin (PRAVACHOL) 80 MG tablet Take 80 mg by mouth daily.   prednisoLONE acetate (PRED FORTE) 1 % ophthalmic suspension Place 1 drop into the left  eye 4 (four) times daily.   psyllium (METAMUCIL) 0.52 g capsule Take 1 capsule (0.52 g total) by mouth at bedtime.   senna-docusate (SENOKOT-S) 8.6-50 MG tablet Take 2 tablets by mouth at bedtime.   Skin Protectants, Misc. (EUCERIN) cream Apply 1 application topically in the morning and at bedtime.   tolnaftate (TINACTIN) 1 % spray Apply 1 spray topically daily. Applied to feet     Allergies:   Tylenol [acetaminophen]   Social History   Socioeconomic History   Marital status: Married    Spouse name: Not on file   Number of children: 5   Years of education: Not on file   Highest education level: Not on file  Occupational History   Not on file  Tobacco Use   Smoking status: Unknown   Smokeless tobacco: Never  Vaping  Use   Vaping Use: Never used  Substance and Sexual Activity   Alcohol use: Never   Drug use: Never   Sexual activity: Not on file  Other Topics Concern   Not on file  Social History Narrative   ** Merged History Encounter **       Social Determinants of Health   Financial Resource Strain: Not on file  Food Insecurity: Not on file  Transportation Needs: Not on file  Physical Activity: Not on file  Stress: Not on file  Social Connections: Not on file     Family History: The patient's family history includes Hypertension in her mother.  ROS:   Please see the history of present illness.    All other systems reviewed and are negative.  EKGs/Labs/Other Studies Reviewed:    The following studies were reviewed today:  TAVR OPERATIVE NOTE    Krystal Maddox [id number]   Date of Procedure:                 08/13/2021   Preoperative Diagnosis:      Severe Aortic Stenosis    Postoperative Diagnosis:    Same    Procedure:        Transcatheter Aortic Valve Replacement - Percutaneous Right Transfemoral Approach             Medtronic Evolut FX  (size 26 mm, model # EVOLUTFX-26, serial # [id number])              Co-Surgeons:            Gaye Pollack, MD and  Lauree Chandler, MD     Anesthesiologist:                  Deatra Canter , MD   Echocardiographer:              Osborne Oman, MD   Pre-operative Echo Findings: Severe aortic stenosis and moderate AI  Normal left ventricular systolic function   Post-operative Echo Findings: Trivial paravalvular leak Normal left ventricular systolic function   _____________    Echo 08/14/21:  IMPRESSIONS   1. The aortic valve has been replaced. There is a 26 mm CoreValve-Evolut  Pro prosthetic (TAVR) valve present in the aortic position. Procedure  Date: 08/13/2021. Effective orifice area, by VTI measures 2.46 cm. Aortic  valve mean gradient measures 7.0  mmHg. Aortic valve acceleration time measures 56 msec. There is mild PVL  seen A3C view (posterior- 5-6 o'clock on PSAX).   2. Left ventricular ejection fraction, by estimation, is 60 to 65%. The  left ventricle has normal function. The left ventricle has no regional  wall motion abnormalities. Left ventricular diastolic parameters are  consistent with Grade I diastolic  dysfunction (impaired relaxation).   3. Right ventricular systolic function is normal. The right ventricular  size is normal. There is normal pulmonary artery systolic pressure.   4. The mitral valve is abnormal. No evidence of mitral valve  regurgitation. No evidence of mitral stenosis.   Comparison(s): A prior study was performed on 08/13/21. Stable gradients  with increase in PVL from implantation.   ________________________  Echo 10/11/21 IMPRESSIONS   1. Left ventricular ejection fraction, by estimation, is >75%. The left  ventricle has hyperdynamic function. The left ventricle has no regional  wall motion abnormalities. There is mild concentric left ventricular  hypertrophy. Left ventricular diastolic  parameters are consistent with Grade I diastolic dysfunction (impaired  relaxation).   2. Right ventricular systolic function  is normal. The right  ventricular  size is normal. There is mildly elevated pulmonary artery systolic  pressure.   3. The mitral valve is normal in structure. Trivial mitral valve  regurgitation. No evidence of mitral stenosis.   4. TAVR mean gradient mildly elevated. Mean grandient was 7 mmHg 08/2021  and 14 mmHg now. Mild perivalvular aortic regurgitation. The aortic valve  is normal in structure. Aortic valve regurgitation is mild. No aortic  stenosis is present. Aortic  regurgitation PHT measures 298 msec. Aortic valve mean gradient measures  14.0 mmHg. Aortic valve Vmax measures 2.54 m/s.   5. The inferior vena cava is normal in size with greater than 50%  respiratory variability, suggesting right atrial pressure of 3 mmHg.    EKG:  EKG is NOT ordered today.   Recent Labs: 08/12/2021: B Natriuretic Peptide 56.4 08/14/2021: Magnesium 1.7 09/09/2021: ALT 12 09/10/2021: TSH 0.667 09/11/2021: Hemoglobin 8.2; Platelets 241 09/12/2021: BUN 29; Creatinine, Ser 1.41; Potassium 5.4; Sodium 130  Recent Lipid Panel No results found for: CHOL, TRIG, HDL, CHOLHDL, VLDL, LDLCALC, LDLDIRECT   Risk Assessment/Calculations:       Physical Exam:    VS:  BP 140/60    Pulse 80    Ht 4\' 11"  (1.499 m)    SpO2 98%    BMI 27.16 kg/m     Wt Readings from Last 3 Encounters:  09/10/21 134 lb 7.7 oz (61 kg)  08/21/21 139 lb 6.4 oz (63.2 kg)  08/14/21 136 lb 6.4 oz (61.9 kg)     GEN:  Well nourished, well developed in no acute distress HEENT: Normal NECK: No JVD LYMPHATICS: No lymphadenopathy CARDIAC: RRR, soft flow murmur. No, rubs, gallops RESPIRATORY:  Clear to auscultation without rales, wheezing or rhonchi  ABDOMEN: Soft, non-tender, non-distended MUSCULOSKELETAL:  No edema; No deformity  SKIN: Warm and dry NEUROLOGIC:  Alert and oriented x 3 PSYCHIATRIC:  Normal affect   ASSESSMENT:    1. S/P TAVR (transcatheter aortic valve replacement)   2. Essential hypertension   3. Stage 3a chronic kidney disease (CKD)  ([place])   4. Hypo-osmolar hyponatremia    PLAN:    In order of problems listed above:  Severe AS s/p TAVR: echo today shows EF 70%, normally functioning TAVR with a mean gradient of 14 mm hg and mild PVL. She has NYHA class I symptoms. SBE prophylaxis discussed; I have RX'd amoxicillin. Continue on aspirin. Continue on aspirin alone. I will see her back in 1 year with echo.    HTN: Bp borderline today. No changes made.    CKD stage IIIa: creat 1.41 last check. Will get a bmet   Hyponatremia: NA 120 last check. BMET today    Medication Adjustments/Labs and Tests Ordered: Current medicines are reviewed at length with the patient today.  Concerns regarding medicines are outlined above.  Orders Placed This Encounter  Procedures   CBC   Basic metabolic panel   ECHOCARDIOGRAM COMPLETE   Meds ordered this encounter  Medications   amoxicillin (AMOXIL) 500 MG tablet    Sig: Take 4 tablets by mouth 1 hour prior to dental procedures and cleanings    Dispense:  12 tablet    Refill:  6    Patient Instructions  Medication Instructions:  Start Amoxicillin 500 mg , take 4 tablets by mouth 1 hour prior to dental procedures and cleanings   *If you need a refill on your cardiac medications before your next appointment, please call your pharmacy*  Lab Work: Bmp, Cbc- Today   If you have labs (blood work) drawn today and your tests are completely normal, you will receive your results only by: [place] (if you have MyChart) OR A paper copy in the mail If you have any lab test that is abnormal or we need to change your treatment, we will call you to review the results.   Testing/Procedures: Your physician has requested that you have an echocardiogram in November. Echocardiography is a painless test that uses sound waves to create images of your heart. It provides your doctor with information about the size and shape of your heart and how well your hearts chambers and valves are  working. This procedure takes approximately one hour. There are no restrictions for this procedure.    Follow-Up: Follow up as scheduled    Other Instructions None     Signed, Angelena Form, PA-C  10/11/2021 12:30 PM    [facility]

## 2021-10-11 ENCOUNTER — Other Ambulatory Visit: Payer: Self-pay

## 2021-10-11 ENCOUNTER — Ambulatory Visit (HOSPITAL_COMMUNITY): Payer: Medicare (Managed Care) | Attending: Cardiovascular Disease

## 2021-10-11 ENCOUNTER — Ambulatory Visit (INDEPENDENT_AMBULATORY_CARE_PROVIDER_SITE_OTHER): Payer: Medicare (Managed Care) | Admitting: Physician Assistant

## 2021-10-11 VITALS — BP 140/60 | HR 80 | Ht 59.0 in

## 2021-10-11 DIAGNOSIS — Z952 Presence of prosthetic heart valve: Secondary | ICD-10-CM | POA: Diagnosis present

## 2021-10-11 DIAGNOSIS — E871 Hypo-osmolality and hyponatremia: Secondary | ICD-10-CM | POA: Diagnosis not present

## 2021-10-11 DIAGNOSIS — N1831 Chronic kidney disease, stage 3a: Secondary | ICD-10-CM | POA: Diagnosis not present

## 2021-10-11 DIAGNOSIS — I10 Essential (primary) hypertension: Secondary | ICD-10-CM

## 2021-10-11 LAB — ECHOCARDIOGRAM COMPLETE
AV Mean grad: 14 mmHg
AV Peak grad: 25.8 mmHg
Ao pk vel: 2.54 m/s
Area-P 1/2: 3.45 cm2
P 1/2 time: 298 msec
S' Lateral: 2.2 cm

## 2021-10-11 MED ORDER — AMOXICILLIN 500 MG PO TABS: ORAL_TABLET | ORAL | 6 refills | Status: DC

## 2021-10-11 NOTE — Patient Instructions (Addendum)
Medication Instructions:  Start Amoxicillin 500 mg , take 4 tablets by mouth 1 hour prior to dental procedures and cleanings   *If you need a refill on your cardiac medications before your next appointment, please call your pharmacy*   Lab Work: Bmp, Cbc- Today   If you have labs (blood work) drawn today and your tests are completely normal, you will receive your results only by: [place] (if you have MyChart) OR A paper copy in the mail If you have any lab test that is abnormal or we need to change your treatment, we will call you to review the results.   Testing/Procedures: Your physician has requested that you have an echocardiogram in November. Echocardiography is a painless test that uses sound waves to create images of your heart. It provides your doctor with information about the size and shape of your heart and how well your hearts chambers and valves are working. This procedure takes approximately one hour. There are no restrictions for this procedure.    Follow-Up: Follow up as scheduled    Other Instructions None

## 2021-10-15 LAB — BASIC METABOLIC PANEL
BUN/Creatinine Ratio: 20 (ref 12–28)
BUN: 24 mg/dL (ref 8–27)
CO2: 22 mmol/L (ref 20–29)
Calcium: 8.6 mg/dL — ABNORMAL LOW (ref 8.7–10.3)
Chloride: 95 mmol/L — ABNORMAL LOW (ref 96–106)
Creatinine, Ser: 1.21 mg/dL — ABNORMAL HIGH (ref 0.57–1.00)
Glucose: 526 mg/dL (ref 70–99)
Potassium: 5.1 mmol/L (ref 3.5–5.2)
Sodium: 130 mmol/L — ABNORMAL LOW (ref 134–144)
eGFR: 45 mL/min/{1.73_m2} — ABNORMAL LOW (ref >59)

## 2021-10-15 LAB — CBC
Hematocrit: 25.8 % — ABNORMAL LOW (ref 34.0–46.6)
Hemoglobin: 7.6 g/dL — ABNORMAL LOW (ref 11.1–15.9)
MCH: 24.6 pg — ABNORMAL LOW (ref 26.6–33.0)
MCHC: 29.5 g/dL — ABNORMAL LOW (ref 31.5–35.7)
MCV: 84 fL (ref 79–97)
Platelets: 401 10*3/uL (ref 150–450)
RBC: 3.09 x10E6/uL — ABNORMAL LOW (ref 3.77–5.28)
RDW: 14.1 % (ref 11.7–15.4)
WBC: 13 10*3/uL — ABNORMAL HIGH (ref 3.4–10.8)

## 2021-10-29 ENCOUNTER — Telehealth: Payer: Self-pay | Admitting: Cardiovascular Disease

## 2021-10-29 NOTE — Telephone Encounter (Signed)
Called Clemens Catholic NP with patient's PCP's office. She stated patient's Hemaglobin in 6.5 and was wondering if patient can come off her ASA. Will send message to Dr. Acie Fredrickson and Dr. Angelena Form (TAVR patient) for advisement.

## 2021-10-29 NOTE — Telephone Encounter (Signed)
Pt c/o medication issue:  1. Name of Medication: aspirin EC 81 MG tablet  2. How are you currently taking this medication (dosage and times per day)? Take 81 mg by mouth daily.  3. Are you having a reaction (difficulty breathing--STAT)?no   4. What is your medication issue? Pts hemoglobin was 6.5.Marland Kitchen would like advise on pt discontinuing this medication

## 2021-10-30 ENCOUNTER — Non-Acute Institutional Stay (HOSPITAL_COMMUNITY)
Admission: RE | Admit: 2021-10-30 | Discharge: 2021-10-30 | Disposition: A | Discharge status: Home / Self Care | Payer: Medicare (Managed Care) | Source: Ambulatory Visit | Attending: Internal Medicine | Admitting: Internal Medicine

## 2021-10-30 ENCOUNTER — Other Ambulatory Visit: Payer: Self-pay

## 2021-10-30 DIAGNOSIS — D509 Iron deficiency anemia, unspecified: Secondary | ICD-10-CM | POA: Insufficient documentation

## 2021-10-30 LAB — CBC WITH DIFFERENTIAL/PLATELET
Abs Immature Granulocytes: 0.04 10*3/uL (ref 0.00–0.07)
Basophils Absolute: 0 10*3/uL (ref 0.0–0.1)
Basophils Relative: 0 %
Eosinophils Absolute: 0.1 10*3/uL (ref 0.0–0.5)
Eosinophils Relative: 1 %
HCT: 27.7 % — ABNORMAL LOW (ref 36.0–46.0)
Hemoglobin: 8.8 g/dL — ABNORMAL LOW (ref 12.0–15.0)
Immature Granulocytes: 0 %
Lymphocytes Relative: 20 %
Lymphs Abs: 1.9 10*3/uL (ref 0.7–4.0)
MCH: 25.5 pg — ABNORMAL LOW (ref 26.0–34.0)
MCHC: 31.8 g/dL (ref 30.0–36.0)
MCV: 80.3 fL (ref 80.0–100.0)
Monocytes Absolute: 1 10*3/uL (ref 0.1–1.0)
Monocytes Relative: 11 %
Neutro Abs: 6.4 10*3/uL (ref 1.7–7.7)
Neutrophils Relative %: 68 %
Platelets: 444 10*3/uL — ABNORMAL HIGH (ref 150–400)
RBC: 3.45 MIL/uL — ABNORMAL LOW (ref 3.87–5.11)
RDW: 15.6 % — ABNORMAL HIGH (ref 11.5–15.5)
WBC: 9.5 10*3/uL (ref 4.0–10.5)
nRBC: 0 % (ref 0.0–0.2)

## 2021-10-30 LAB — CBC
HCT: 23.8 % — ABNORMAL LOW (ref 36.0–46.0)
Hemoglobin: 7.3 g/dL — ABNORMAL LOW (ref 12.0–15.0)
MCH: 24.2 pg — ABNORMAL LOW (ref 26.0–34.0)
MCHC: 30.7 g/dL (ref 30.0–36.0)
MCV: 78.8 fL — ABNORMAL LOW (ref 80.0–100.0)
Platelets: 479 10*3/uL — ABNORMAL HIGH (ref 150–400)
RBC: 3.02 MIL/uL — ABNORMAL LOW (ref 3.87–5.11)
RDW: 15.3 % (ref 11.5–15.5)
WBC: 9.3 10*3/uL (ref 4.0–10.5)
nRBC: 0 % (ref 0.0–0.2)

## 2021-10-30 LAB — PREPARE RBC (CROSSMATCH)

## 2021-10-30 MED ORDER — DIPHENHYDRAMINE HCL 25 MG PO CAPS
25.0000 mg | ORAL_CAPSULE | Freq: Once | ORAL | Status: AC
  Filled 2021-10-30: qty 1

## 2021-10-30 MED ORDER — ACETAMINOPHEN 325 MG PO TABS
650.0000 mg | ORAL_TABLET | Freq: Once | ORAL | Status: AC
  Administered 2021-10-30: 650 mg via ORAL

## 2021-10-30 MED ORDER — DIPHENHYDRAMINE HCL 50 MG/ML IJ SOLN
25.0000 mg | Freq: Once | INTRAMUSCULAR | Status: AC
  Administered 2021-10-30: 25 mg via INTRAVENOUS

## 2021-10-30 MED ORDER — FAMOTIDINE IN NACL 20-0.9 MG/50ML-% IV SOLN
20.0000 mg | Freq: Once | INTRAVENOUS | Status: AC
  Administered 2021-10-30: 20 mg via INTRAVENOUS

## 2021-10-30 MED ORDER — SODIUM CHLORIDE 0.9% IV SOLUTION: Freq: Once | INTRAVENOUS | Status: AC

## 2021-10-30 MED ORDER — FAMOTIDINE IN NACL 20-0.9 MG/50ML-% IV SOLN
INTRAVENOUS | Status: AC
  Filled 2021-10-30: qty 50

## 2021-10-30 MED ORDER — DIPHENHYDRAMINE HCL 25 MG PO CAPS
25.0000 mg | ORAL_CAPSULE | Freq: Once | ORAL | Status: AC
  Administered 2021-10-30: 25 mg via ORAL
  Filled 2021-10-30: qty 1

## 2021-10-30 MED ORDER — DIPHENHYDRAMINE HCL 50 MG/ML IJ SOLN
INTRAMUSCULAR | Status: AC
  Filled 2021-10-30: qty 1

## 2021-10-30 NOTE — Progress Notes (Signed)
PATIENT CARE CENTER NOTE:  Diagnosis: D50.9- iron deficiency anemia unspecified   Provider: Clemens Catholic DNP  Procedure: blood transfusion   Patient received via PIV 1 unit of packed red blood cells. Type and screen was done before transfusion, confirmed with blood bank pt has had T/S at a [place] facility in the past. Consent for blood products obtained from patient, reviewed s/s of a reaction, pt questions answered. Pt instructed to go to ED if develops any symptoms of reaction once at home, verbalized understanding. Pre- transfusion CBC was collected, Hgb 7.3 .  Pre transfusion medications were given as ordered, Benadryl PO. Pt allergic to Tylenol, confirmed with provider Clemens Catholic DNP to hold Tylenol today. Tolerated transfusion well, however 30 minutes after infusion completed, pt c/o slight headache, itching and hive noticed to R side of face. Clemens Catholic DNP notified, order given for 25 mg IV Benadryl and 20mg  IVPB famotidine.  Per provider pt can take 650mg  of Tylenol PO if she is agreeable, reported reaction to Tylenol in the past was nausea, ok to override allergy. Pt agreeable to taking PO Tylenol. Order given to repeat the benadryl x1 after 30 minutes; however not required as pt stated she felt significantly better. Post transfusion CBC was drawn.   Vitals stable, discharge instructions given. Confirmed with provider that pt is stable and can be discharged from center. Reviewed with blood bank that it is provider's discretion to send a transfusion reaction report for non anaphylactic reactions. Per provider, not necessary to submit report. Interpreter Emogene Morgan  from [employer]. was utilized throughout visit today via telephone due to patients translation needs. Practice Administrator notified that an outside interpreter was used today. Patient alert, oriented and ambulatory with cane at the time of discharge; however taken in wheelchair by staff member to meet [place]  driver as arranged by Claudia Desanctis of [employer] service.

## 2021-10-30 NOTE — Telephone Encounter (Signed)
Spoke with Clemens Catholic, NP and advised per Dr Angelena Form and Dr Acie Fredrickson ok to hold Aspirin.  NP states pt is being sent for blood transfusion today and she will put the ASA on hold.  She thanked Therapist, sports for the callback.  Burnell Blanks, MD  Nahser, Wonda Cheng, MD; Aris [place], Olin Hauser, RN; [facility] Triage 2 minutes ago (8:38 AM)   Agree she should hold ASA. Lum Keas, RN routed conversation to [employer] Triage 15 hours ago (5:40 PM)   Nahser, Wonda Cheng, MD  Michaelyn Barter, RN; Burnell Blanks, MD 15 hours ago (5:04 PM)   I will defer to Dr. Angelena Form but I would suspect she will need to  hold ASA

## 2021-10-31 LAB — BPAM RBC
Blood Product Expiration Date: 202303102359
ISSUE DATE / TIME: 202302221237
Unit Type and Rh: 6200

## 2021-10-31 LAB — TYPE AND SCREEN
ABO/RH(D): A POS
Antibody Screen: NEGATIVE
Unit division: 0

## 2021-11-05 ENCOUNTER — Other Ambulatory Visit: Payer: Medicare (Managed Care)

## 2021-11-12 ENCOUNTER — Other Ambulatory Visit: Payer: Medicare (Managed Care)

## 2022-02-03 ENCOUNTER — Encounter: Payer: Self-pay | Admitting: Cardiovascular Disease

## 2022-02-03 NOTE — Progress Notes (Unsigned)
Cardiology Office Note:    Date:  02/04/2022   ID:  Krystal Maddox, DOB 11-21-1940, MRN [id number]  PCP:  [facility], [place]  Cardiologist:  Environmental consultant:  None   Referring MD: [facility], [place]   Chief Complaint  Patient presents with   Aortic Stenosis     Previous notes:    Krystal Maddox is a 81 y.o. female with a hx of hypertension, heart murmur, shortness of breath, stage III chronic kidney disease.  She was recently found to have aortic stenosis.  We are asked to evaluate her for further evaluation and management of her aortic stenosis.  Seen with interpreter, Raynaldo Opitz   She was previously seen by Dr. Elsworth Soho.  More recently she has been [facility]. Echocardiogram from November 18, 2019 reveals normal left ventricular systolic function.  She has grade 1 diastolic dysfunction.  She has severe calcification and thickening of the aortic valve.  There is a mean aortic valve gradient of 36 mmHg with a peak gradient of 71.7 mmHg.  She has had a heart murmur for years .  Coughing recently . Has cp when she cough.  Coughs only rarely   No knowledge of rheumatic fever as a child   Has had both covid vaccines.   Has DOE , no cp with exertion  Unable to walk very far due to DOE .    July 24, 2020: Krystal Maddox is a 81 yo [place] .  She  is seen today for follow up of her AS, dyspnea, CKD. She has moderate - severe AS .  Echo from March, 2021 shows normal LV function.  Grade 1 DD.  Peak AS gradient of 71 with  Mean AS gradient of 36.   Is not very active Still has some shortness of breath but says she is feeling better.   Jan 10, 2021:  Krystal Maddox is a 81 yo [place].  Seen with Interpreter, Diah Siu.   She has a hx of aortic stenosis Recent echo shows worsening AV gradient Has had a syncopal episode Some DOE No CP   Dec. 14, 2022 Seen with interpreter, [name]  MS. Bagdasarian is seen today for follow  up of her TAVR TAVR - Dec. 6 ER visit - Dec. 9 for pericarditis - WBC of 27,000  ECG shows some persistent ST elevation - improved since Dec. 9.   Complains of constipation / indigestion  Will try omeprazole for 1 month and sugar free metamucil   Feb 04, 2022 Krystal Maddox is seen an 81 yo [place] patient seen  for follow up of her TAVR ( Dec. 6, 4098)  Complicated by pericarditis  Seen with interpreter,  Paula Libra  Is feeling better   Has generalized aches in her legs.  Ive asked her to speak to her primary MD about this  BP looks great.   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 Angelena Form and Dr Cyndia Bent.  Severe aortic stenosis    Superficial vein thrombosis 05/21/2021   R arm   Vitamin D deficiency     Past Surgical History:  Procedure Laterality Date   EYE SURGERY Left 2022   cataract   INTRAOPERATIVE TRANSTHORACIC ECHOCARDIOGRAM N/A 08/13/2021   Procedure: INTRAOPERATIVE TRANSTHORACIC ECHOCARDIOGRAM;  Surgeon: Burnell Blanks, MD;  Location: [facility];  Service: Open Heart Surgery;  Laterality: N/A;   KNEE SURGERY Left    RIGHT/LEFT HEART CATH AND CORONARY ANGIOGRAPHY N/A 05/17/2021   Procedure: RIGHT/LEFT HEART CATH AND CORONARY ANGIOGRAPHY;  Surgeon: Burnell Blanks, MD;  Location: [facility];  Service: Cardiovascular;  Laterality: N/A;   TRANSCATHETER AORTIC VALVE REPLACEMENT, TRANSFEMORAL N/A 08/13/2021   Procedure: TRANSCATHETER AORTIC VALVE REPLACEMENT, TRANSFEMORAL;  Surgeon: Burnell Blanks, MD;  Location: [facility];  Service: Open Heart  Surgery;  Laterality: N/A;    Current Medications: Current Meds  Medication Sig   amLODipine (NORVASC) 10 MG tablet Take 10 mg by mouth daily.    amoxicillin (AMOXIL) 500 MG tablet Take 4 tablets by mouth 1 hour prior to dental procedures and cleanings   Calcium Carbonate-Vit D-Min (GNP CALCIUM 600 +D/MINERALS) 600-800 MG-UNIT TABS Take 1 tablet by mouth daily.   diphenhydrAMINE (BENADRYL) 25 MG tablet Take 25 mg by mouth every 8 (eight) hours as needed.   furosemide (LASIX) 20 MG tablet Take 20 mg by mouth.   glipiZIDE (GLUCOTROL) 5 MG tablet Take 10 mg by mouth 2 (two) times daily.   Iron-Vitamin C (VITRON-C PO) Take 1 tablet by mouth daily in the afternoon.   linagliptin (TRADJENTA) 5 MG TABS tablet Take 5 mg by mouth daily.   losartan (COZAAR) 25 MG tablet Take 25 mg by mouth daily.   pantoprazole (PROTONIX) 40 MG tablet Take 1 tablet (40 mg total) by mouth daily.   pravastatin (PRAVACHOL) 80 MG tablet Take 80 mg by mouth daily.   senna-docusate (SENOKOT-S) 8.6-50 MG tablet Take 2 tablets by mouth at bedtime.   sodium bicarbonate 650 MG tablet Take 650 mg by mouth 2 (two) times daily.   [DISCONTINUED] loratadine (CLARITIN) 10 MG tablet Take 10 mg by mouth daily.     Allergies:   Tylenol [acetaminophen]   Social History   Socioeconomic History   Marital status: Married    Spouse name: Not on file   Number of children: 5   Years of education: Not on file   Highest education level: Not on file  Occupational History   Not on file  Tobacco Use   Smoking status: Unknown   Smokeless tobacco: Never  Vaping Use   Vaping Use: Never used  Substance and Sexual Activity   Alcohol use: Never   Drug use: Never   Sexual activity: Not on file  Other Topics Concern   Not on file  Social History Narrative   ** Merged History Encounter **       Social Determinants of Health   Financial Resource Strain: Not on file  Food Insecurity: Not on file  Transportation Needs: Not on file   Physical Activity: Not on file  Stress: Not on file  Social Connections: Not on file     Family History: The patient's family history includes Hypertension in her mother.  Family is in [place] .  She is a  Leisure centre manager   No known medical histyory.   Family did not go to the doctor   ROS:   Please see the history  of present illness.     All other systems reviewed and are negative.  EKGs/Labs/Other Studies Reviewed:    The following studies were reviewed today:   EKG:         Recent Labs: 08/12/2021: B Natriuretic Peptide 56.4 08/14/2021: Magnesium 1.7 09/09/2021: ALT 12 09/10/2021: TSH 0.667 10/11/2021: BUN 24; Creatinine, Ser 1.21; Potassium 5.1; Sodium 130 10/30/2021: Hemoglobin 8.8; Platelets 444  Recent Lipid Panel No results found for: CHOL, TRIG, HDL, CHOLHDL, VLDL, LDLCALC, LDLDIRECT  Physical Exam:    Physical Exam: Blood pressure 134/64, pulse 81, height 4\' 11"  (1.499 m), SpO2 96 %.  GEN:  elderly female,   HEENT: Normal NECK: No JVD; No carotid bruits LYMPHATICS: No lymphadenopathy CARDIAC: RRR  soft systolic murmur  RESPIRATORY:  Clear to auscultation without rales, wheezing or rhonchi  ABDOMEN: Soft, non-tender, non-distended MUSCULOSKELETAL:  No edema; No deformity  SKIN: Warm and dry NEUROLOGIC:  Alert and oriented x 3    ASSESSMENT:    1. History of transcatheter aortic valve replacement (TAVR)      PLAN:       Aortic stenosis: She is doing well status post TAVR.  She had some postoperative pericarditis.   She is feeling much better  .  Has rare episodes of leg swelling  Legs look great today .   Overall I think she is doing very well.  Medication Adjustments/Labs and Tests Ordered: Current medicines are reviewed at length with the patient today.  Concerns regarding medicines are outlined above.  No orders of the defined types were placed in this encounter.   No orders of the defined types were placed in this encounter.      Patient  Instructions  Medication Instructions:  Your physician recommends that you continue on your current medications as directed. Please refer to the Current Medication list given to you today.  *If you need a refill on your cardiac medications before your next appointment, please call your pharmacy*   Lab Work: NONE If you have labs (blood work) drawn today and your tests are completely normal, you will receive your results only by: [place] (if you have MyChart) OR A paper copy in the mail If you have any lab test that is abnormal or we need to change your treatment, we will call you to review the results.   Testing/Procedures: NONE   Follow-Up: At [facility], you and your health needs are our priority.  As part of our continuing mission to provide you with exceptional heart care, we have created designated Provider Care Teams.  These Care Teams include your primary Cardiologist (physician) and Advanced Practice Providers (APPs -  Physician Assistants and Nurse Practitioners) who all work together to provide you with the care you need, when you need it.  We recommend signing up for the patient portal called "MyChart".  Sign up information is provided on this After Visit Summary.  MyChart is used to connect with patients for Virtual Visits (Telemedicine).  Patients are able to view lab/test results, encounter notes, upcoming appointments, etc.  Non-urgent messages can be sent to your provider as well.   To learn more about what you can do with MyChart, go to [email or web address].    Your next appointment:   1 year(s)  The format for your next appointment:   In Person  Provider:   Mertie Moores, MD     Important Information About Sugar         Signed, Mertie Moores, MD  02/04/2022  5:31 PM    [facility]

## 2022-02-04 ENCOUNTER — Encounter: Payer: Self-pay | Admitting: Cardiovascular Disease

## 2022-02-04 ENCOUNTER — Ambulatory Visit (INDEPENDENT_AMBULATORY_CARE_PROVIDER_SITE_OTHER): Payer: Medicare (Managed Care) | Admitting: Cardiovascular Disease

## 2022-02-04 VITALS — BP 134/64 | HR 81 | Ht 59.0 in

## 2022-02-04 DIAGNOSIS — Z952 Presence of prosthetic heart valve: Secondary | ICD-10-CM | POA: Diagnosis not present

## 2022-02-04 NOTE — Patient Instructions (Signed)

## 2022-04-14 ENCOUNTER — Ambulatory Visit: Admission: RE | Admit: 2022-04-14 | Payer: Medicare (Managed Care) | Source: Ambulatory Visit

## 2022-04-14 ENCOUNTER — Other Ambulatory Visit: Payer: Self-pay | Admitting: Vascular Surgery

## 2022-04-14 DIAGNOSIS — Z8739 Personal history of other diseases of the musculoskeletal system and connective tissue: Secondary | ICD-10-CM

## 2022-07-04 NOTE — Progress Notes (Deleted)
[place]                                     Cardiology Office Note:    Date:  07/04/2022   ID:  Krystal Maddox, DOB [birth year], MRN [id number]  PCP:  [facility], [place] Cardiologist:  Mertie Moores, MD  [place] Electrophysiologist:  None   Referring MD: [place]   No chief complaint on file. ***  History of Present Illness:    Krystal Maddox is a 81 y.o. female with a hx of  HTN, CKD stage III, PAD and severe aortic stenosis s/p TAVR (08/13/21) who presents to clinic for follow up.    Ms. Perkovich was referred to Dr. Angelena Form for further evaluation of her aortic stenosis and possible TAVR on 05/10/21. She has been followed by Dr. Acie Fredrickson for moderate aortic stenosis since 2021. Echocardiogram at that time showed an aortic valve mean gradient of 36 mmHg. She was seen by Dr. Acie Fredrickson in 01/2021 and reported a syncopal episode as well as worsened dyspnea on exertion. Repeat echo 01/03/21 showed an LVE at 60-65% with severe aortic stenosis, mean gradient 54 mmHg, peak graadient 90.6 mmHg, AVA 0.56 cm2, and dimensionless index 0.22. She does not speak [place] and was seen with the assistance of an interpreter. She reported progressive dyspnea with exertion but no chest pain, or LE edema. Plan at that time was to continue ith TAVR workup to include [facility] and CT imaging.    Cardiac catheterization performed 05/17/21 showed moderate mid RCA stenosis however did not appear to be flow limiting with mild calcified proximal LAD stenosis and severe aortic stenosis with mean gradient 46.2 mmHg, peak to peak gradient 51 mmHg, AVA 0.76 cm2). There were normal right and left heart pressures   She was evaluated by the multidisciplinary valve team and underwent successful TAVR with a 47mm Medtronic Evolut FX via the TF approach on 08/13/21. Post operative echo showed EF 60%, normally functioning TAVR  with a mean gradient of 7 mmHg and mild PVL seen A3C view (posterior- 5-6 o'clock on PSAX).    She was seen in the Ed on 12/9 for chest pain and diagnosed with acute pericarditis with a WBC of 27,000. She was treated with ibuprofen and colchicine.    Then readmitted 1/2-09/12/21 for hypo-osmolar hyponatremia. She was treated with half normal saline and water restrictions and thiazide discontinued.   Today the patient presents to clinic for follow up. An interpreter is here with her. She is doing well with good energy. She has some chest  pain after eating quickly and having food stuck in her throat. Breathing is good. No swelling legs. No orthopnea or PND. No dizziness or syncope. Appetite is decreased.     Severe AS s/p TAVR: echo today shows EF 70%, normally functioning TAVR with a mean gradient of 14 mm hg and mild PVL. She has NYHA class I symptoms. SBE prophylaxis discussed; I have RX'd amoxicillin. Continue on aspirin. Continue on aspirin alone. I will see her back in 1 year with echo.    HTN: Bp borderline today. No changes made.    CKD stage IIIa: creat 1.41 last check. Will get a bmet   Hyponatremia: NA 120 last check. 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 Angelena Form and Dr Cyndia Bent.   Severe aortic stenosis    Superficial vein thrombosis 05/21/2021   R arm   Vitamin D deficiency     Past Surgical History:  Procedure Laterality Date   EYE SURGERY Left 2022   cataract   INTRAOPERATIVE TRANSTHORACIC ECHOCARDIOGRAM N/A 08/13/2021   Procedure: INTRAOPERATIVE TRANSTHORACIC ECHOCARDIOGRAM;  Surgeon: Burnell Blanks, MD;   Location: [place];  Service: Open Heart Surgery;  Laterality: N/A;   KNEE SURGERY Left    RIGHT/LEFT HEART CATH AND CORONARY ANGIOGRAPHY N/A 05/17/2021   Procedure: RIGHT/LEFT HEART CATH AND CORONARY ANGIOGRAPHY;  Surgeon: Burnell Blanks, MD;  Location: [place] [facility];  Service: Cardiovascular;  Laterality: N/A;   TRANSCATHETER AORTIC VALVE REPLACEMENT, TRANSFEMORAL N/A 08/13/2021   Procedure: TRANSCATHETER AORTIC VALVE REPLACEMENT, TRANSFEMORAL;  Surgeon: Burnell Blanks, MD;  Location: [place] [facility];  Service: Open Heart Surgery;  Laterality: N/A;    Current Medications: No outpatient medications have been marked as taking for the 07/09/22 encounter (Appointment) with [facility].     Allergies:   Tylenol [acetaminophen]   Social History   Socioeconomic History   Marital status: Married    Spouse name: Not on file   Number of children: 5   Years of education: Not on file   Highest education level: Not on file  Occupational History   Not on file  Tobacco Use   Smoking status: Unknown   Smokeless tobacco: Never  Vaping Use   Vaping Use: Never used  Substance and Sexual Activity   Alcohol use: Never   Drug use: Never   Sexual activity: Not on file  Other Topics Concern   Not on file  Social History Narrative   ** Merged History Encounter **       Social Determinants of Health   Financial Resource Strain: Not on file  Food Insecurity: Not on file  Transportation Needs: Not on file  Physical Activity: Not on file  Stress: Not on file  Social Connections: Not on file     Family History: The patient's ***family history includes Hypertension in her mother.  ROS:   Please see the history of present illness.    All other systems reviewed and are negative.  EKGs/Labs/Other Studies Reviewed:    The following studies were reviewed today:  TAVR OPERATIVE NOTE    Antha Niday [id number]   Date of Procedure:                  08/13/2021   Preoperative Diagnosis:      Severe Aortic Stenosis    Postoperative Diagnosis:    Same    Procedure:        Transcatheter Aortic Valve Replacement - Percutaneous Right Transfemoral Approach             Medtronic Evolut FX  (size 26 mm, model # EVOLUTFX-26, serial # [id number])              Co-Surgeons:            Gaye Pollack, MD and Lauree Chandler, MD  Anesthesiologist:                  Deatra Canter , MD   Echocardiographer:              Osborne Oman, MD   Pre-operative Echo Findings: Severe aortic stenosis and moderate AI  Normal left ventricular systolic function   Post-operative Echo Findings: Trivial paravalvular leak Normal left ventricular systolic function   _____________     Echo 08/14/21:  IMPRESSIONS   1. The aortic valve has been replaced. There is a 26 mm CoreValve-Evolut  Pro prosthetic (TAVR) valve present in the aortic position. Procedure  Date: 08/13/2021. Effective orifice area, by VTI measures 2.46 cm. Aortic  valve mean gradient measures 7.0  mmHg. Aortic valve acceleration time measures 56 msec. There is mild PVL  seen A3C view (posterior- 5-6 o'clock on PSAX).   2. Left ventricular ejection fraction, by estimation, is 60 to 65%. The  left ventricle has normal function. The left ventricle has no regional  wall motion abnormalities. Left ventricular diastolic parameters are  consistent with Grade I diastolic  dysfunction (impaired relaxation).   3. Right ventricular systolic function is normal. The right ventricular  size is normal. There is normal pulmonary artery systolic pressure.   4. The mitral valve is abnormal. No evidence of mitral valve  regurgitation. No evidence of mitral stenosis.   Comparison(s): A prior study was performed on 08/13/21. Stable gradients  with increase in PVL from implantation.    ________________________   Echo 10/11/21 IMPRESSIONS   1. Left ventricular ejection fraction, by  estimation, is >75%. The left  ventricle has hyperdynamic function. The left ventricle has no regional  wall motion abnormalities. There is mild concentric left ventricular  hypertrophy. Left ventricular diastolic  parameters are consistent with Grade I diastolic dysfunction (impaired  relaxation).   2. Right ventricular systolic function is normal. The right ventricular  size is normal. There is mildly elevated pulmonary artery systolic  pressure.   3. The mitral valve is normal in structure. Trivial mitral valve  regurgitation. No evidence of mitral stenosis.   4. TAVR mean gradient mildly elevated. Mean grandient was 7 mmHg 08/2021  and 14 mmHg now. Mild perivalvular aortic regurgitation. The aortic valve  is normal in structure. Aortic valve regurgitation is mild. No aortic  stenosis is present. Aortic  regurgitation PHT measures 298 msec. Aortic valve mean gradient measures  14.0 mmHg. Aortic valve Vmax measures 2.54 m/s.   5. The inferior vena cava is normal in size with greater than 50%  respiratory variability, suggesting right atrial pressure of 3 mmHg     EKG:  EKG is *** ordered today.  The ekg ordered today demonstrates ***  Recent Labs: 08/12/2021: B Natriuretic Peptide 56.4 08/14/2021: Magnesium 1.7 09/09/2021: ALT 12 09/10/2021: TSH 0.667 10/11/2021: BUN 24; Creatinine, Ser 1.21; Potassium 5.1; Sodium 130 10/30/2021: Hemoglobin 8.8; Platelets 444  Recent Lipid Panel No results found for: "CHOL", "TRIG", "HDL", "CHOLHDL", "VLDL", "LDLCALC", "LDLDIRECT"   Risk Assessment/Calculations:   {Does this patient have ATRIAL FIBRILLATION?:[phone]}   Physical Exam:    VS:  There were no vitals taken for this visit.    Wt Readings from Last 3 Encounters:  09/10/21 134 lb 7.7 oz (61 kg)  08/21/21 139 lb 6.4 oz (63.2 kg)  08/14/21 136 lb 6.4 oz (61.9 kg)     GEN: *** Well nourished, well developed in no acute distress HEENT: Normal NECK: No JVD; No carotid  bruits LYMPHATICS:  No lymphadenopathy CARDIAC: ***RRR, no murmurs, rubs, gallops RESPIRATORY:  Clear to auscultation without rales, wheezing or rhonchi  ABDOMEN: Soft, non-tender, non-distended MUSCULOSKELETAL:  No edema; No deformity  SKIN: Warm and dry NEUROLOGIC:  Alert and oriented x 3 PSYCHIATRIC:  Normal affect   ASSESSMENT:    No diagnosis found. PLAN:    In order of problems listed above:       {Are you ordering a CV Procedure (e.g. stress test, cath, DCCV, TEE, etc)?   Press F2        :[id number]}    Medication Adjustments/Labs and Tests Ordered: Current medicines are reviewed at length with the patient today.  Concerns regarding medicines are outlined above.  No orders of the defined types were placed in this encounter.  No orders of the defined types were placed in this encounter.   There are no Patient Instructions on file for this visit.   Signed, Kathyrn Drown, NP  07/04/2022 11:00 AM    [place]

## 2022-07-09 ENCOUNTER — Other Ambulatory Visit (HOSPITAL_COMMUNITY): Payer: Medicare (Managed Care)

## 2022-07-09 ENCOUNTER — Ambulatory Visit: Payer: Medicare (Managed Care)

## 2022-07-11 ENCOUNTER — Ambulatory Visit: Payer: Medicare (Managed Care)

## 2022-07-11 ENCOUNTER — Other Ambulatory Visit (HOSPITAL_COMMUNITY): Payer: Medicare (Managed Care)

## 2022-09-11 NOTE — Progress Notes (Signed)
[place]                                     Cardiology Office Note:    Date:  09/12/2022   ID:  Krystal Maddox, DOB [birth year], MRN [id number]  PCP:  [facility] Cardiologist:  Mertie Moores, MD  / Dr. Angelena Form, MD & Dr. Cyndia Bent, MD (TAVR) [facility] Electrophysiologist:  None   Referring MD: [facility], [facility]*   1 year s/p TAVR   History of Present Illness:    Krystal Maddox is a 82 y.o. 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   Krystal Maddox was referred to Dr. Angelena Form for further evaluation of her aortic stenosis and possible TAVR on 05/10/21. She has been followed by Dr. Acie Fredrickson for moderate aortic stenosis since 2021. Echocardiogram at that time showed an aortic valve mean gradient of 36 mmHg. She was seen by Dr. Acie Fredrickson in 01/2021 and reported a syncopal episode as well as worsened dyspnea on exertion. Repeat echo 01/03/21 showed an LVE at 60-65% with severe aortic stenosis, mean gradient 54 mmHg, peak graadient 90.6 mmHg, AVA 0.56 cm2, and dimensionless index 0.22. She does not speak [place] and was seen with the assistance of an interpreter. She reported progressive dyspnea with exertion but no chest pain, or LE edema. Plan at that time was to continue ith TAVR workup to include [facility] and CT imaging.    Cardiac catheterization performed 05/17/21 showed moderate mid RCA stenosis however did not appear to be flow limiting with mild calcified proximal LAD stenosis and severe aortic stenosis with mean gradient 46.2 mmHg, peak to peak gradient 51 mmHg, AVA 0.76 cm2). There were normal right and left heart pressures   She was evaluated by the multidisciplinary valve team and underwent successful TAVR with a 79mm Medtronic Evolut FX via the TF approach on 08/13/21. Post operative echo showed  EF 60%, normally functioning TAVR with a mean gradient of 7 mmHg and mild PVL seen A3C view (posterior- 5-6 o'clock on PSAX).   She was seen in the Ed on 12/9 for chest pain and diagnosed with acute pericarditis with a WBC of 27,000. She was treated with ibuprofen and colchicine.   Then readmitted 1/2-09/12/21 for hypo-osmolar hyponatremia. She was treated with half normal saline and water restrictions and thiazide discontinued. 1 month echo showed EF 70%, normally functioning TAVR with a mean gradient of 14 mm hg and mild PVL.   Today the patient presents to clinic for follow up. An interpreter is here with her. No CP or SOB. No LE edema, orthopnea or PND. No dizziness or syncope. No blood in stool or urine. No palpitations.Not taking any aspirin because no one will buy it for her. 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 Angelena Form and Dr Cyndia Bent.   Severe aortic stenosis    Superficial vein thrombosis 05/21/2021   R arm   Vitamin D deficiency     Past Surgical History:  Procedure Laterality Date   EYE SURGERY Left 2022   cataract   INTRAOPERATIVE TRANSTHORACIC ECHOCARDIOGRAM N/A 08/13/2021   Procedure: INTRAOPERATIVE TRANSTHORACIC ECHOCARDIOGRAM;  Surgeon: Burnell Blanks, MD;  Location: [facility];  Service: Open Heart Surgery;  Laterality: N/A;   KNEE SURGERY Left    RIGHT/LEFT HEART CATH AND CORONARY ANGIOGRAPHY N/A 05/17/2021   Procedure: RIGHT/LEFT HEART CATH AND CORONARY ANGIOGRAPHY;  Surgeon: Burnell Blanks, MD;  Location: [place] [facility];  Service: Cardiovascular;   Laterality: N/A;   TRANSCATHETER AORTIC VALVE REPLACEMENT, TRANSFEMORAL N/A 08/13/2021   Procedure: TRANSCATHETER AORTIC VALVE REPLACEMENT, TRANSFEMORAL;  Surgeon: Burnell Blanks, MD;  Location: [facility];  Service: Open Heart Surgery;  Laterality: N/A;    Current Medications: Current Meds  Medication Sig   amLODipine (NORVASC) 10 MG tablet Take 10 mg by mouth daily.    amoxicillin (AMOXIL) 500 MG tablet Take 4 tablets by mouth 1 hour prior to dental procedures and cleanings   Calcium Carb-Cholecalciferol 600-20 MG-MCG TABS Take 1 tablet by mouth in the morning and at bedtime.   Calcium Carbonate-Vit D-Min (GNP CALCIUM 600 +D/MINERALS) 600-800 MG-UNIT TABS Take 1 tablet by mouth daily.   diphenhydrAMINE (BENADRYL) 25 MG tablet Take 25 mg by mouth every 8 (eight) hours as needed.   ferrous sulfate 325 (65 FE) MG tablet Take 325 mg by mouth daily.   furosemide (LASIX) 20 MG tablet Take 20 mg by mouth.   glipiZIDE (GLUCOTROL) 5 MG tablet Take 10 mg by mouth 2 (two) times daily.   Iron-Vitamin C (VITRON-C PO) Take 1 tablet by mouth daily in the afternoon.   linagliptin (TRADJENTA) 5 MG TABS tablet Take 5 mg by mouth daily.   losartan (COZAAR) 25 MG tablet Take 25 mg by mouth daily.   losartan-hydrochlorothiazide (HYZAAR) 100-12.5 MG tablet Take 1 tablet by mouth daily.   Menthol, Topical Analgesic, (BIOFREEZE EX) Apply 1 application  topically 4 (four) times daily as needed (joint/muscle pain). 3.5 % Gel   Menthol-Methyl Salicylate (SALONPAS PAIN RELIEF PATCH EX) Place 1 patch onto the skin every 8 (eight) hours as needed (pain).   Multiple Vitamins-Minerals (PRESERVISION AREDS 2 PO) Take 1 tablet by mouth in the morning and at bedtime.   pantoprazole (PROTONIX) 40 MG tablet Take 1 tablet (40 mg total) by mouth daily.   Polyethyl Glyc-Propyl Glyc PF (SYSTANE HYDRATION PF) 0.4-0.3 % SOLN Place 1 drop into both eyes 4 (four) times daily as needed (dry eyes).   pravastatin  (PRAVACHOL) 80 MG tablet Take 80 mg by mouth daily.   prednisoLONE acetate (PRED FORTE) 1 % ophthalmic suspension Place 1 drop into the left eye 4 (four) times daily.   psyllium (METAMUCIL) 0.52 g capsule Take 1 capsule (0.52 g total) by mouth at bedtime.   senna-docusate (SENOKOT-S) 8.6-50 MG tablet Take 2 tablets by mouth at bedtime.   Skin Protectants, Misc. (EUCERIN) cream Apply 1 application  topically in the morning and at bedtime.   sodium bicarbonate 650 MG tablet Take 650 mg by mouth 2 (two) times daily.   tolnaftate (TINACTIN) 1 % spray Apply 1 spray topically daily. Applied to feet     Allergies:   Tylenol [acetaminophen]   Social History   Socioeconomic History   Marital status: Married  Spouse name: Not on file   Number of children: 5   Years of education: Not on file   Highest education level: Not on file  Occupational History   Not on file  Tobacco Use   Smoking status: Unknown   Smokeless tobacco: Never  Vaping Use   Vaping Use: Never used  Substance and Sexual Activity   Alcohol use: Never   Drug use: Never   Sexual activity: Not on file  Other Topics Concern   Not on file  Social History Narrative   ** Merged History Encounter **       Social Determinants of Health   Financial Resource Strain: Not on file  Food Insecurity: Not on file  Transportation Needs: Not on file  Physical Activity: Not on file  Stress: Not on file  Social Connections: Not on file     Family History: The patient's family history includes Hypertension in her mother.  ROS:   Please see the history of present illness.    All other systems reviewed and are negative.  EKGs/Labs/Other Studies Reviewed:    The following studies were reviewed today:  TAVR OPERATIVE NOTE    Emili Mcloughlin [id number]   Date of Procedure:                 08/13/2021   Preoperative Diagnosis:      Severe Aortic Stenosis    Postoperative Diagnosis:    Same    Procedure:         Transcatheter Aortic Valve Replacement - Percutaneous Right Transfemoral Approach             Medtronic Evolut FX  (size 26 mm, model # EVOLUTFX-26, serial # [id number])              Co-Surgeons:            Gaye Pollack, MD and Lauree Chandler, MD     Anesthesiologist:                  Deatra Canter , MD   Echocardiographer:              Osborne Oman, MD   Pre-operative Echo Findings: Severe aortic stenosis and moderate AI  Normal left ventricular systolic function   Post-operative Echo Findings: Trivial paravalvular leak Normal left ventricular systolic function   _____________    Echo 08/14/21:  IMPRESSIONS   1. The aortic valve has been replaced. There is a 26 mm CoreValve-Evolut  Pro prosthetic (TAVR) valve present in the aortic position. Procedure  Date: 08/13/2021. Effective orifice area, by VTI measures 2.46 cm. Aortic  valve mean gradient measures 7.0  mmHg. Aortic valve acceleration time measures 56 msec. There is mild PVL  seen A3C view (posterior- 5-6 o'clock on PSAX).   2. Left ventricular ejection fraction, by estimation, is 60 to 65%. The  left ventricle has normal function. The left ventricle has no regional  wall motion abnormalities. Left ventricular diastolic parameters are  consistent with Grade I diastolic  dysfunction (impaired relaxation).   3. Right ventricular systolic function is normal. The right ventricular  size is normal. There is normal pulmonary artery systolic pressure.   4. The mitral valve is abnormal. No evidence of mitral valve  regurgitation. No evidence of mitral stenosis.   Comparison(s): A prior study was performed on 08/13/21. Stable gradients  with increase in PVL from implantation.   ________________________  Echo 10/11/21 IMPRESSIONS   1. Left ventricular ejection fraction, by  estimation, is >75%. The left  ventricle has hyperdynamic function. The left ventricle has no regional  wall motion abnormalities. There is  mild concentric left ventricular  hypertrophy. Left ventricular diastolic  parameters are consistent with Grade I diastolic dysfunction (impaired  relaxation).   2. Right ventricular systolic function is normal. The right ventricular  size is normal. There is mildly elevated pulmonary artery systolic  pressure.   3. The mitral valve is normal in structure. Trivial mitral valve  regurgitation. No evidence of mitral stenosis.   4. TAVR mean gradient mildly elevated. Mean grandient was 7 mmHg 08/2021  and 14 mmHg now. Mild perivalvular aortic regurgitation. The aortic valve  is normal in structure. Aortic valve regurgitation is mild. No aortic  stenosis is present. Aortic  regurgitation PHT measures 298 msec. Aortic valve mean gradient measures  14.0 mmHg. Aortic valve Vmax measures 2.54 m/s.   5. The inferior vena cava is normal in size with greater than 50%  respiratory variability, suggesting right atrial pressure of 3 mmHg.   ___________________________  Echo 09/12/22  IMPRESSIONS   1. Left ventricular ejection fraction, by estimation, is 60 to 65%. Left  ventricular ejection fraction by 3D volume is 61 %. The left ventricle has  normal function. The left ventricle has no regional wall motion  abnormalities. Left ventricular diastolic   parameters are consistent with Grade I diastolic dysfunction (impaired  relaxation). Elevated left ventricular end-diastolic pressure.   2. Right ventricular systolic function is normal. The right ventricular  size is normal. There is mildly elevated pulmonary artery systolic  pressure. The estimated right ventricular systolic pressure is 18.5 mmHg.   3. The mitral valve is normal in structure. Trivial mitral valve  regurgitation. No evidence of mitral stenosis.   4. The aortic valve has been repaired/replaced. Aortic valve  regurgitation is not visualized. No aortic stenosis is present. There is a  26 mm Medtronic stented (TAVR) valve present in the  aortic position.  Procedure Date: 08/13/2021. Aortic valve area, by   VTI measures 1.82 cm. Aortic valve mean gradient measures 9.0 mmHg.  Aortic valve Vmax measures 2.06 m/s. DVI 0.58 VMax 2.29m/s   5. The inferior vena cava is normal in size with greater than 50%  respiratory variability, suggesting right atrial pressure of 3 mmHg.     EKG:  EKG is NOT ordered today.   Recent Labs: 10/11/2021: BUN 24; Creatinine, Ser 1.21; Potassium 5.1; Sodium 130 10/30/2021: Hemoglobin 8.8; Platelets 444  Recent Lipid Panel No results found for: "CHOL", "TRIG", "HDL", "CHOLHDL", "VLDL", "LDLCALC", "LDLDIRECT"   Risk Assessment/Calculations:       Physical Exam:    VS:  BP (!) 146/60   Pulse 85   Ht 4\' 11"  (1.499 m)   Wt 148 lb 12.8 oz (67.5 kg)   SpO2 97%   BMI 30.05 kg/m     Wt Readings from Last 3 Encounters:  09/12/22 148 lb 12.8 oz (67.5 kg)  09/10/21 134 lb 7.7 oz (61 kg)  08/21/21 139 lb 6.4 oz (63.2 kg)     GEN:  Well nourished, well developed in no acute distress HEENT: Normal NECK: No JVD LYMPHATICS: No lymphadenopathy CARDIAC: RRR, soft flow murmur. No, rubs, gallops RESPIRATORY:  Clear to auscultation without rales, wheezing or rhonchi  ABDOMEN: Soft, non-tender, non-distended MUSCULOSKELETAL:  No edema; No deformity  SKIN: Warm and dry NEUROLOGIC:  Alert and oriented x 3 PSYCHIATRIC:  Normal affect   ASSESSMENT:    1. S/P TAVR (transcatheter  aortic valve replacement)   2. Essential hypertension   3. Stage 3a chronic kidney disease (CKD) ([place])   4. Hypo-osmolar hyponatremia     PLAN:    In order of problems listed above:  Severe AS s/p TAVR: echo today shows EF 60%, normally functioning TAVR with a mean gradient of 9 mm hg and no PVL. She has NYHA class I symptoms. SBE prophylaxis discussed; I have RX'd amoxicillin. Continue on aspirin. Continue on aspirin alone (given samples today). She will continue regular follow up with Dr. Acie Fredrickson.    HTN: Bp well  controlled. No changes made.    CKD stage IIIa: labs followed by PCP   Hyponatremia: labs followed by PCP   Medication Adjustments/Labs and Tests Ordered: Current medicines are reviewed at length with the patient today.  Concerns regarding medicines are outlined above.  No orders of the defined types were placed in this encounter.  No orders of the defined types were placed in this encounter.   Patient Instructions  Medication Instructions:  Your physician recommends that you continue on your current medications as directed. Please refer to the Current Medication list given to you today.  *If you need a refill on your cardiac medications before your next appointment, please call your pharmacy*   Lab Work: None ordered   If you have labs (blood work) drawn today and your tests are completely normal, you will receive your results only by: [place] (if you have MyChart) OR A paper copy in the mail If you have any lab test that is abnormal or we need to change your treatment, we will call you to review the results.   Testing/Procedures: None ordered    Follow-Up: Follow up as scheduled   Other Instructions   Important Information About Sugar         Signed, Angelena Form, PA-C  09/12/2022 5:17 PM    [facility]

## 2022-09-12 ENCOUNTER — Ambulatory Visit (HOSPITAL_COMMUNITY): Payer: Medicare (Managed Care) | Attending: Cardiology

## 2022-09-12 ENCOUNTER — Ambulatory Visit (INDEPENDENT_AMBULATORY_CARE_PROVIDER_SITE_OTHER): Payer: Medicare (Managed Care) | Admitting: Physician Assistant

## 2022-09-12 VITALS — BP 146/60 | HR 85 | Ht 59.0 in | Wt 148.8 lb

## 2022-09-12 DIAGNOSIS — I10 Essential (primary) hypertension: Secondary | ICD-10-CM | POA: Diagnosis not present

## 2022-09-12 DIAGNOSIS — E871 Hypo-osmolality and hyponatremia: Secondary | ICD-10-CM

## 2022-09-12 DIAGNOSIS — Z952 Presence of prosthetic heart valve: Secondary | ICD-10-CM | POA: Insufficient documentation

## 2022-09-12 DIAGNOSIS — N1831 Chronic kidney disease, stage 3a: Secondary | ICD-10-CM | POA: Diagnosis not present

## 2022-09-12 LAB — ECHOCARDIOGRAM COMPLETE
AR max vel: 1.88 cm2
AV Area VTI: 1.82 cm2
AV Area mean vel: 1.9 cm2
AV Mean grad: 9 mmHg
AV Peak grad: 17 mmHg
Ao pk vel: 2.06 m/s
Area-P 1/2: 4.06 cm2
S' Lateral: 2.45 cm

## 2022-09-12 NOTE — Patient Instructions (Signed)

## 2022-10-01 ENCOUNTER — Ambulatory Visit
Admission: RE | Admit: 2022-10-01 | Discharge: 2022-10-01 | Disposition: A | Discharge status: Home / Self Care | Payer: Medicare (Managed Care) | Source: Ambulatory Visit | Attending: Vascular Surgery | Admitting: Vascular Surgery

## 2022-10-01 DIAGNOSIS — Z8739 Personal history of other diseases of the musculoskeletal system and connective tissue: Secondary | ICD-10-CM

## 2022-10-15 ENCOUNTER — Telehealth: Payer: Self-pay | Admitting: Cardiovascular Disease

## 2022-10-15 NOTE — Telephone Encounter (Signed)
Pt c/o medication issue:  1. Name of Medication: aspirin EC 81 MG tablet   2. How are you currently taking this medication (dosage and times per day)? Take 81 mg by mouth daily.   3. Are you having a reaction (difficulty breathing--STAT)? no  4. What is your medication issue? Calling to see if they need to restart the patient on this medication. State they took her off of it, a year ago. Please advise

## 2022-10-15 NOTE — Telephone Encounter (Signed)
Returned call to [place] (PCP) with [facility].  [place] wanted to make sure our providers were aware that she had discontinued patient's aspirin about a year ago due to blood loss requiring blood transfusion.  Patient is currently taking aspirin 81mg  daily. [place] states she can check a CBC in 1 month to follow hemoglobin. She would like to know if our providers do want patient to continue ASA 81mg  daily and to let her know so she can order it for the patient.  Will forward to Dr. Acie Fredrickson and his nurse to review and follow-up with [place] at [place] on decision.

## 2022-10-17 NOTE — Telephone Encounter (Signed)
I spoke with [place] (PCP) with [facility] and made her aware that the pt can stop ASA if needed.  Per [place] the pt is taking ASA without any issues at this time.  She is planning to recheck the pt's CBC in 1 month and if her labs are stable then she will plan on continuing ASA.If the pt's HGB has dropped then she will stop ASA since it is okay from a cardiology stand point.

## 2023-01-10 ENCOUNTER — Encounter: Payer: Self-pay | Admitting: Cardiovascular Disease

## 2023-01-10 NOTE — Progress Notes (Unsigned)
Cardiology Office Note:    Date:  01/13/2023   ID:  Krystal Maddox, DOB Jul 02, 1941, MRN [id number]  PCP:  [facility]  Cardiologist:  Freight forwarder:  None   Referring MD: [facility]   Chief Complaint  Patient presents with   Aortic Stenosis   TAVR     Previous notes:    Krystal Maddox is a 82 y.o. female with a hx of hypertension, heart murmur, shortness of breath, stage III chronic kidney disease.  She was recently found to have aortic stenosis.  We are asked to evaluate her for further evaluation and management of her aortic stenosis.  Seen with interpreter, Francesco Sor   She was previously seen by Dr. Vassie Loll.  More recently [facility] has [facility]. Echocardiogram from November 18, 2019 reveals normal left ventricular systolic function.  She has grade 1 diastolic dysfunction.  She has severe calcification and thickening of the aortic valve.  There is a mean aortic valve gradient of 36 mmHg with a peak gradient of 71.7 mmHg.  She has had a heart murmur for years .  Coughing recently . Has cp when she cough.  Coughs only rarely   No knowledge of rheumatic fever as a child   Has had both covid vaccines.   Has DOE , no cp with exertion  Unable to walk very far due to DOE .    July 24, 2020: Krystal Maddox is a 82 yo [place] .  She  is seen today for follow up of her AS, dyspnea, CKD. She has moderate - severe AS .  Echo from March, 2021 shows normal LV function.  Grade 1 DD.  Peak AS gradient of 71 with  Mean AS gradient of 36.   Is not very active Still has some shortness of breath but says she is feeling better.   Jan 10, 2021:  Krystal Maddox is a 82 yo [place].  Seen with Interpreter, Diah Siu.   She has a hx of aortic stenosis Recent echo shows worsening AV gradient Has had a syncopal episode Some DOE No CP   Dec. 14, 2022 Seen with interpreter, Susa Raring  Krystal Maddox is seen today for  follow up of her TAVR TAVR - Dec. 6 ER visit - Dec. 9 for pericarditis - WBC of 27,000  ECG shows some persistent ST elevation - improved since Dec. 9.   Complains of constipation / indigestion  Will try omeprazole for 1 month and sugar free metamucil   Feb 04, 2022 Krystal Maddox is seen an 82 yo [place] patient seen  for follow up of her TAVR ( Dec. 6, 2022)  Complicated by pericarditis  Seen with interpreter,  Rosario Jacks  Is feeling better   Has generalized aches in her legs.  Ive asked her to speak to her primary MD about this  BP looks great.   She has stopped her losartan - HCTZ   Jan 13, 2023 Krystal Maddox is seen for follow up of her AS , S/pTAVR. 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 Clifton James and Dr Laneta Simmers.   Severe aortic stenosis    Superficial vein thrombosis 05/21/2021   R arm   Vitamin D deficiency     Past Surgical History:  Procedure Laterality Date   EYE SURGERY Left 2022   cataract   INTRAOPERATIVE TRANSTHORACIC ECHOCARDIOGRAM N/A 08/13/2021   Procedure: INTRAOPERATIVE TRANSTHORACIC ECHOCARDIOGRAM;  Surgeon: Kathleene Hazel, MD;  Location: [facility] INVASIVE CV LAB;  Service: Open Heart Surgery;  Laterality: N/A;   KNEE SURGERY Left    RIGHT/LEFT HEART CATH AND CORONARY ANGIOGRAPHY N/A 05/17/2021   Procedure: RIGHT/LEFT HEART CATH AND CORONARY ANGIOGRAPHY;  Surgeon: Kathleene Hazel, MD;  Location: [facility] INVASIVE CV LAB;  Service: Cardiovascular;  Laterality: N/A;   TRANSCATHETER AORTIC VALVE REPLACEMENT, TRANSFEMORAL N/A 08/13/2021    Procedure: TRANSCATHETER AORTIC VALVE REPLACEMENT, TRANSFEMORAL;  Surgeon: Kathleene Hazel, MD;  Location: [facility] INVASIVE CV LAB;  Service: Open Heart Surgery;  Laterality: N/A;    Current Medications: Current Meds  Medication Sig   amLODipine (NORVASC) 10 MG tablet Take 10 mg by mouth daily.    amoxicillin (AMOXIL) 500 MG tablet Take 4 tablets by mouth 1 hour prior to dental procedures and cleanings   aspirin EC 81 MG tablet Take 81 mg by mouth daily.   Calcium Carb-Cholecalciferol 600-20 MG-MCG TABS Take 1 tablet by mouth in the morning and at bedtime.   Calcium Carbonate-Vit D-Min (GNP CALCIUM 600 +D/MINERALS) 600-800 MG-UNIT TABS Take 1 tablet by mouth daily.   diphenhydrAMINE (BENADRYL) 25 MG tablet Take 25 mg by mouth every 8 (eight) hours as needed.   ferrous sulfate 325 (65 FE) MG tablet Take 325 mg by mouth daily.   furosemide (LASIX) 20 MG tablet Take 20 mg by mouth.   glipiZIDE (GLUCOTROL) 5 MG tablet Take 10 mg by mouth 2 (two) times daily.   Iron-Vitamin C (VITRON-C PO) Take 1 tablet by mouth daily in the afternoon.   linagliptin (TRADJENTA) 5 MG TABS tablet Take 5 mg by mouth daily.   losartan (COZAAR) 25 MG tablet Take 25 mg by mouth daily.   losartan-hydrochlorothiazide (HYZAAR) 100-12.5 MG tablet Take 1 tablet by mouth daily.   Menthol, Topical Analgesic, (BIOFREEZE EX) Apply 1 application  topically 4 (four) times daily as needed (joint/muscle pain). 3.5 % Gel   Menthol-Methyl Salicylate (SALONPAS PAIN RELIEF PATCH EX) Place 1 patch onto the skin every 8 (eight) hours as needed (pain).   Multiple Vitamins-Minerals (PRESERVISION AREDS 2 PO) Take 1 tablet by mouth in the morning and at bedtime.   pantoprazole (PROTONIX) 40 MG tablet Take 1 tablet (40 mg total) by mouth daily.   Polyethyl Glyc-Propyl Glyc PF (SYSTANE HYDRATION PF) 0.4-0.3 % SOLN Place 1 drop into both eyes 4 (four) times daily as needed (dry eyes).   pravastatin (PRAVACHOL) 80 MG tablet Take 80 mg by mouth  daily.   prednisoLONE acetate (PRED FORTE) 1 % ophthalmic suspension Place 1 drop into the left eye 4 (four) times daily.   psyllium (METAMUCIL) 0.52 g capsule Take 1 capsule (0.52 g total) by mouth at bedtime.   senna-docusate (SENOKOT-S) 8.6-50 MG tablet Take 2 tablets by mouth at bedtime.   Skin Protectants, Misc. (EUCERIN) cream Apply 1 application  topically in the morning and at bedtime.   sodium bicarbonate 650 MG tablet Take 650 mg by mouth 2 (two) times daily.  tolnaftate (TINACTIN) 1 % spray Apply 1 spray topically daily. Applied to feet     Allergies:   Tylenol [acetaminophen]   Social History   Socioeconomic History   Marital status: Married    Spouse name: Not on file   Number of children: 5   Years of education: Not on file   Highest education level: Not on file  Occupational History   Not on file  Tobacco Use   Smoking status: Unknown   Smokeless tobacco: Never  Vaping Use   Vaping Use: Never used  Substance and Sexual Activity   Alcohol use: Never   Drug use: Never   Sexual activity: Not on file  Other Topics Concern   Not on file  Social History Narrative   ** Merged History Encounter **       Social Determinants of Health   Financial Resource Strain: Not on file  Food Insecurity: Not on file  Transportation Needs: Not on file  Physical Activity: Not on file  Stress: Not on file  Social Connections: Not on file     Family History: The patient's family history includes Hypertension in her mother.  Family is in [place] .  She is a  Counselling psychologist   No known medical histyory.   Family did not go to the doctor   ROS:   Please see the history of present illness.     All other systems reviewed and are negative.  EKGs/Labs/Other Studies Reviewed:    The following studies were reviewed today:   EKG:    Jan 13, 2023: Normal sinus rhythm at 79.  Nonspecific T wave abnormality.  Abnormal EKG.     Recent Labs: No results found for requested labs  within last 365 days.  Recent Lipid Panel No results found for: "CHOL", "TRIG", "HDL", "CHOLHDL", "VLDL", "LDLCALC", "LDLDIRECT"  Physical Exam:    Physical Exam: Blood pressure 138/60, pulse 79, height 4\' 11"  (1.499 m), weight 148 lb (67.1 kg), SpO2 95 %.       GEN:  Well nourished, well developed in no acute distress HEENT: Normal NECK: No JVD; No carotid bruits LYMPHATICS: No lymphadenopathy CARDIAC: RRR , no murmurs, rubs, gallops RESPIRATORY:  Clear to auscultation without rales, wheezing or rhonchi  ABDOMEN: Soft, non-tender, non-distended MUSCULOSKELETAL:  No edema; No deformity  SKIN: Warm and dry NEUROLOGIC:  Alert and oriented x 3     ASSESSMENT:    1. Aortic valve stenosis, etiology of cardiac valve disease unspecified   2. S/P TAVR (transcatheter aortic valve replacement)   3. Primary hypertension       PLAN:       Aortic stenosis:  s/p TAVR , ding well  Cont meds Will see her back in 1 year      Medication Adjustments/Labs and Tests Ordered: Current medicines are reviewed at length with the patient today.  Concerns regarding medicines are outlined above.  Orders Placed This Encounter  Procedures   EKG 12-Lead    No orders of the defined types were placed in this encounter.      Patient Instructions  Your aortic valve was fixed with a  TAVR ( Transcatheter aortic valve replacement )  Medication Instructions:  Your physician recommends that you continue on your current medications as directed. Please refer to the Current Medication list given to you today.  *If you need a refill on your cardiac medications before your next appointment, please call your pharmacy*   Lab Work: NONE If you  have labs (blood work) drawn today and your tests are completely normal, you will receive your results only by: MyChart Message (if you have MyChart) OR A paper copy in the mail If you have any lab test that is abnormal or we need to change your  treatment, we will call you to review the results.   Testing/Procedures: NONE   Follow-Up: At [facility], you and your health needs are our priority.  As part of our continuing mission to provide you with exceptional heart care, we have created designated Provider Care Teams.  These Care Teams include your primary Cardiologist (physician) and Advanced Practice Providers (APPs -  Physician Assistants and Nurse Practitioners) who all work together to provide you with the care you need, when you need it.  We recommend signing up for the patient portal called "MyChart".  Sign up information is provided on this After Visit Summary.  MyChart is used to connect with patients for Virtual Visits (Telemedicine).  Patients are able to view lab/test results, encounter notes, upcoming appointments, etc.  Non-urgent messages can be sent to your provider as well.   To learn more about what you can do with MyChart, go to [email or web address].    Your next appointment:   1 year(s)  Provider:   Kristeen Miss, MD           Signed, Kristeen Miss, MD  01/13/2023 5:58 PM    [facility]

## 2023-01-13 ENCOUNTER — Ambulatory Visit: Payer: Medicare (Managed Care) | Attending: Cardiovascular Disease | Admitting: Cardiovascular Disease

## 2023-01-13 ENCOUNTER — Encounter: Payer: Self-pay | Admitting: Cardiovascular Disease

## 2023-01-13 VITALS — BP 138/60 | HR 79 | Ht 59.0 in | Wt 148.0 lb

## 2023-01-13 DIAGNOSIS — Z952 Presence of prosthetic heart valve: Secondary | ICD-10-CM

## 2023-01-13 DIAGNOSIS — I350 Nonrheumatic aortic (valve) stenosis: Secondary | ICD-10-CM

## 2023-01-13 DIAGNOSIS — I10 Essential (primary) hypertension: Secondary | ICD-10-CM

## 2023-01-13 NOTE — Patient Instructions (Addendum)
Your aortic valve was fixed with a  TAVR ( Transcatheter aortic valve replacement )  Medication Instructions:  Your physician recommends that you continue on your current medications as directed. Please refer to the Current Medication list given to you today.  *If you need a refill on your cardiac medications before your next appointment, please call your pharmacy*   Lab Work: NONE If you have labs (blood work) drawn today and your tests are completely normal, you will receive your results only by: MyChart Message (if you have MyChart) OR A paper copy in the mail If you have any lab test that is abnormal or we need to change your treatment, we will call you to review the results.   Testing/Procedures: NONE   Follow-Up: At [facility], you and your health needs are our priority.  As part of our continuing mission to provide you with exceptional heart care, we have created designated Provider Care Teams.  These Care Teams include your primary Cardiologist (physician) and Advanced Practice Providers (APPs -  Physician Assistants and Nurse Practitioners) who all work together to provide you with the care you need, when you need it.  We recommend signing up for the patient portal called "MyChart".  Sign up information is provided on this After Visit Summary.  MyChart is used to connect with patients for Virtual Visits (Telemedicine).  Patients are able to view lab/test results, encounter notes, upcoming appointments, etc.  Non-urgent messages can be sent to your provider as well.   To learn more about what you can do with MyChart, go to [email or web address].    Your next appointment:   1 year(s)  Provider:   Kristeen Miss, MD

## 2023-10-01 IMAGING — DX DG CHEST 2V
2 series · 2 of 2 positions shown · non-contrast
Comparison: X-ray chest 07/03/2021.

CLINICAL DATA: Preoperative evaluation for aortic stenosis surgery.

EXAM:
CHEST - 2 VIEW

[w chest pa]
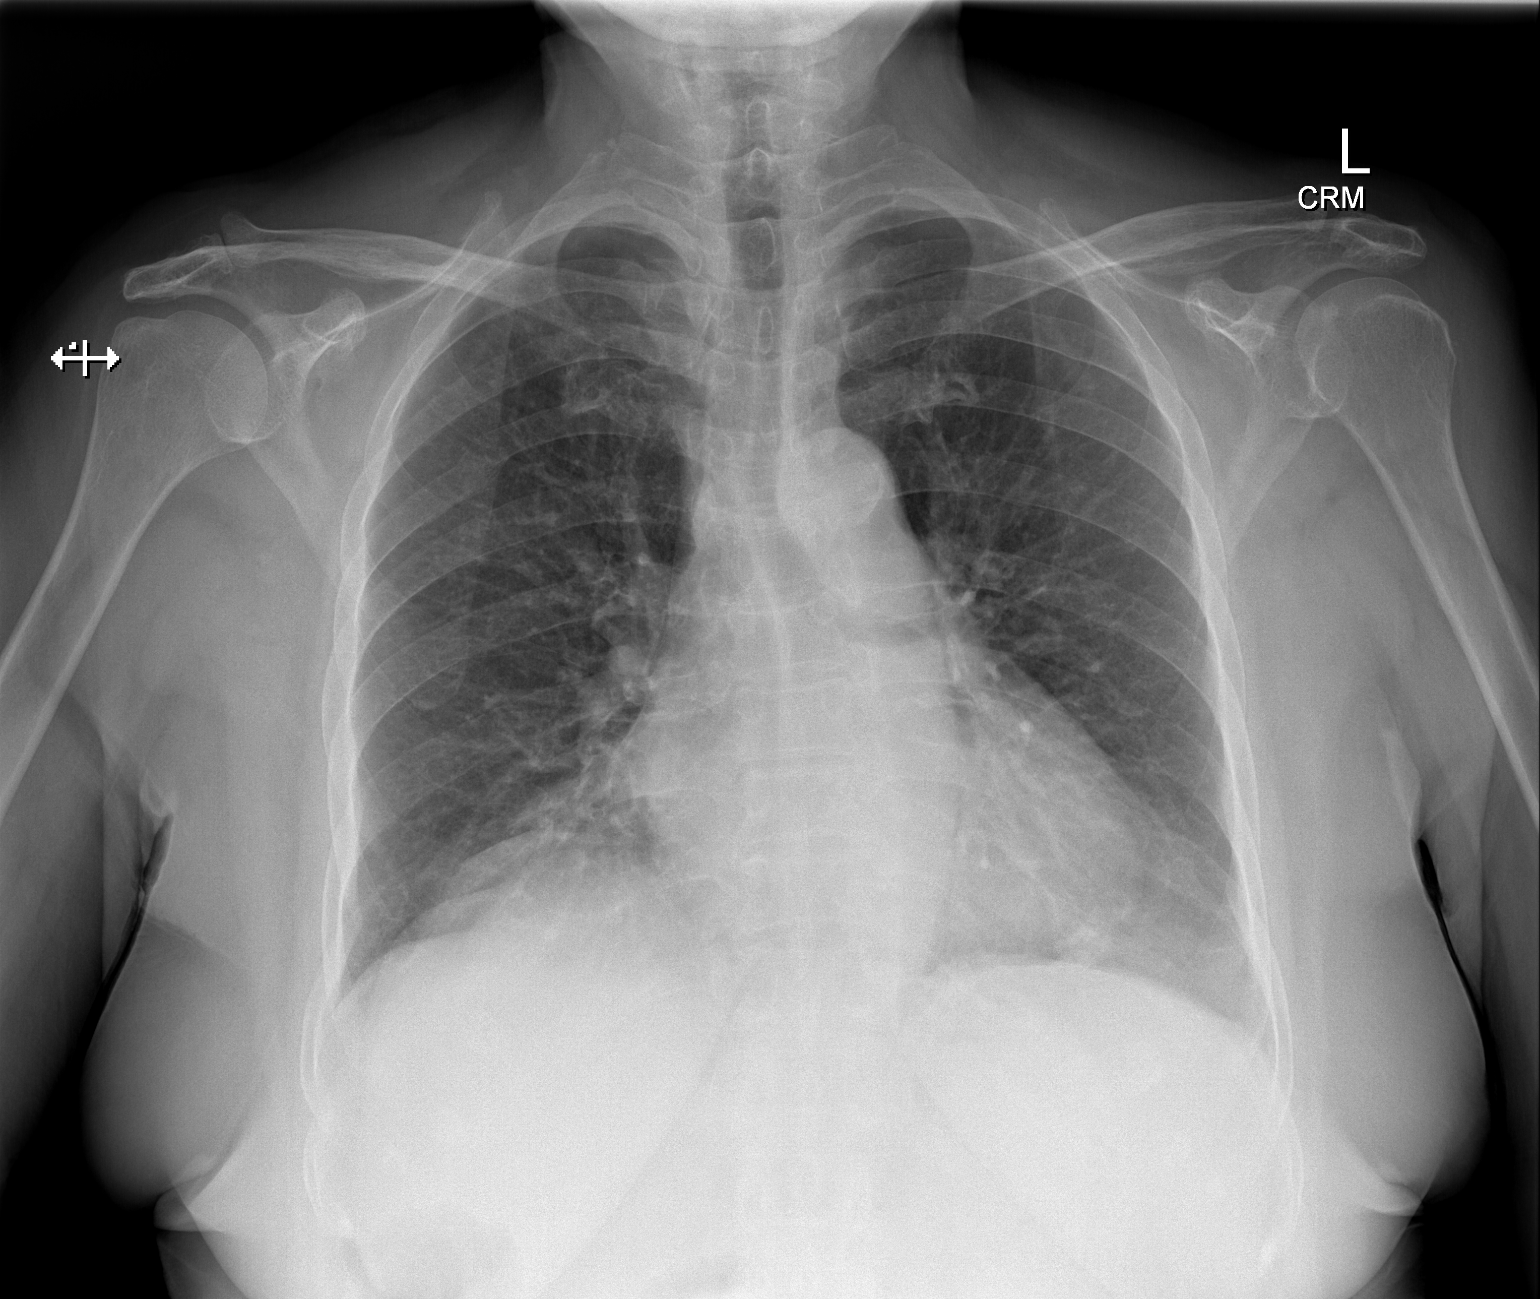

[w chest lat]
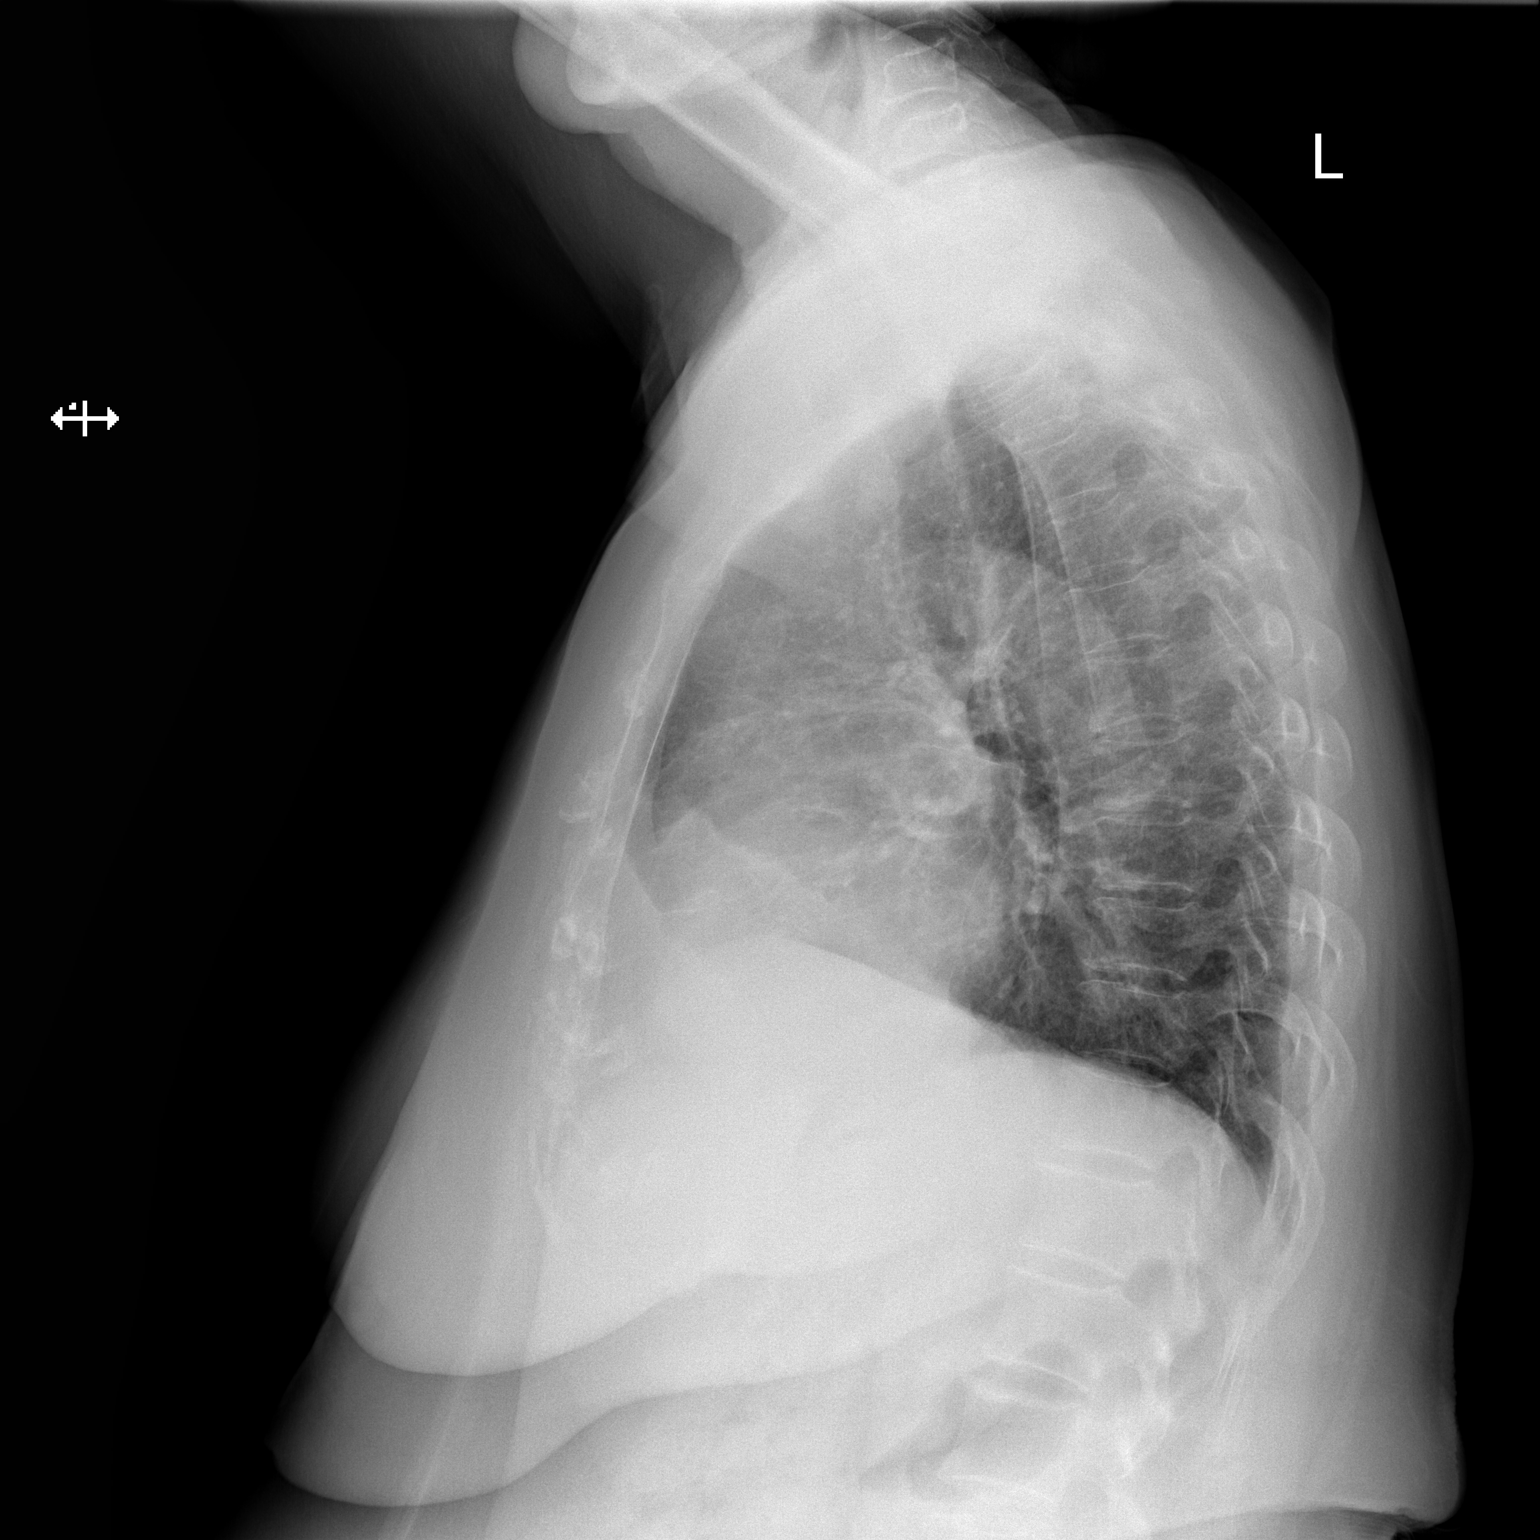

[2 of 2 positions shown; findings below may reference images not displayed]

FINDINGS: No focal consolidation. No pleural effusion or pneumothorax. Mild
stable cardiomegaly.

No acute osseous abnormality.
IMPRESSION: No active cardiopulmonary disease.

## 2023-10-05 IMAGING — DX DG CHEST 1V PORT
1 series · 1 of 1 positions shown · non-contrast
Comparison: 08/12/2021

CLINICAL DATA: Chest pain after TAVR

EXAM:
PORTABLE CHEST 1 VIEW

[chest]
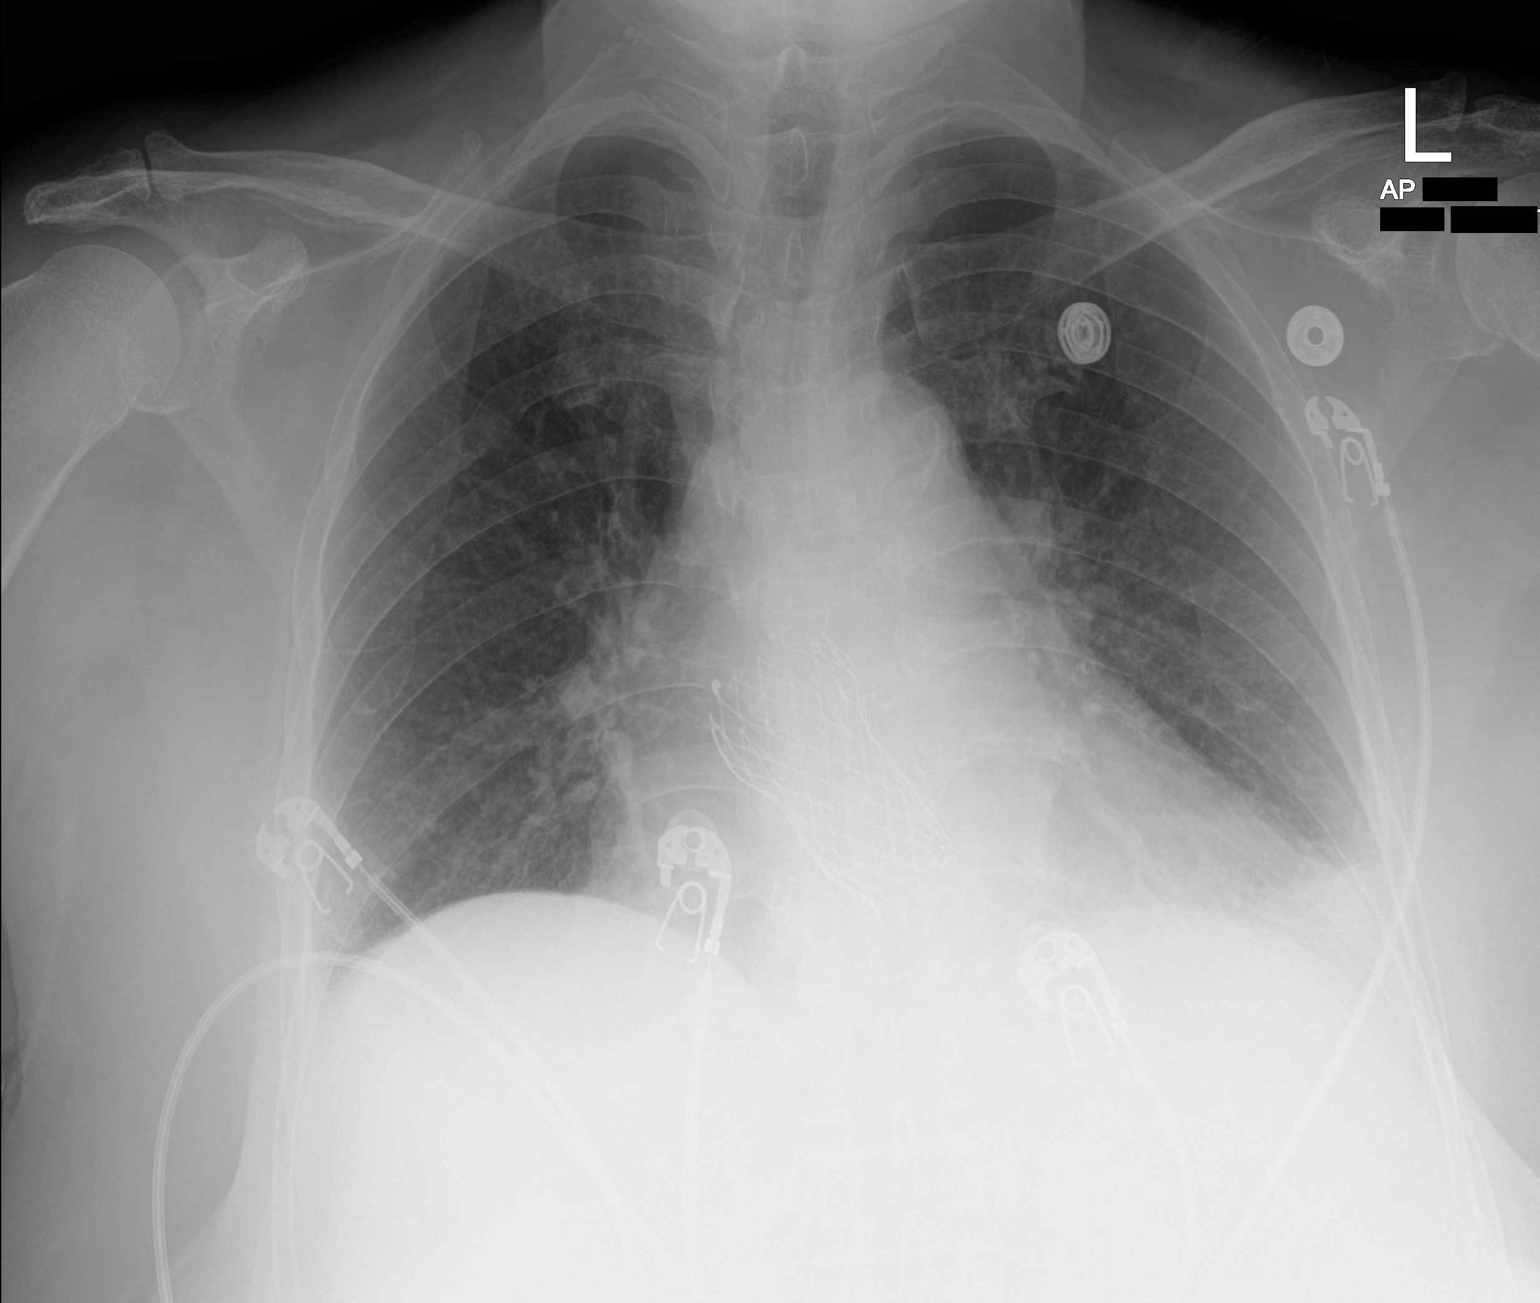

[1 of 1 positions shown; findings below may reference images not displayed]

FINDINGS: Status post TAVR. Midline trachea. Mild cardiomegaly.
Atherosclerosis in the transverse aorta. Probable small left pleural
effusion. No pneumothorax. No congestive failure. Remote right rib
trauma. Suspicion of left base airspace disease.
IMPRESSION: Small left pleural effusion with adjacent Airspace disease, likely
atelectasis.

Cardiomegaly, without congestive failure.

Aortic Atherosclerosis (SEDO8-VBC.C).

## 2023-10-05 IMAGING — CT CT ANGIO CHEST
3 of 6 series · 19 of 36 positions shown · IV contrast (APPLIED)
Comparison: 06/14/2021

CLINICAL DATA: Acute aortic syndrome.  Chest pain.

EXAM:
CT ANGIOGRAPHY CHEST WITH CONTRAST
TECHNIQUE: Multidetector CT imaging of the chest was performed using the
standard protocol during bolus administration of intravenous
contrast. Multiplanar CT image reconstructions and MIPs were
obtained to evaluate the vascular anatomy.
CONTRAST:  100mL OMNIPAQUE IOHEXOL 350 MG/ML SOLN

[Series 5: arterial · axial · arterial · 0.77mm/px · z∈[-208,+20]mm · 12 of 136 slices shown]
[im 11/136  lung]
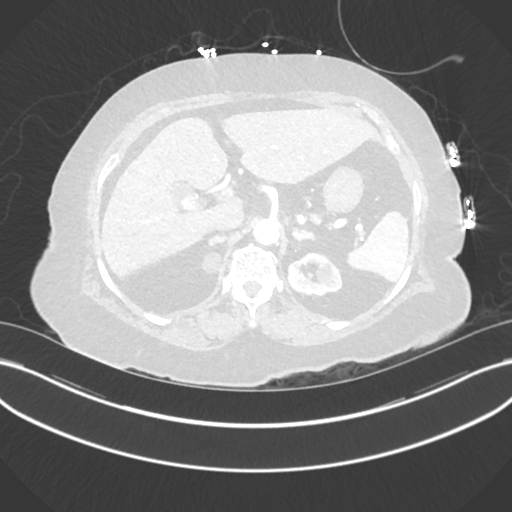
[im 21/136  mediastinal]
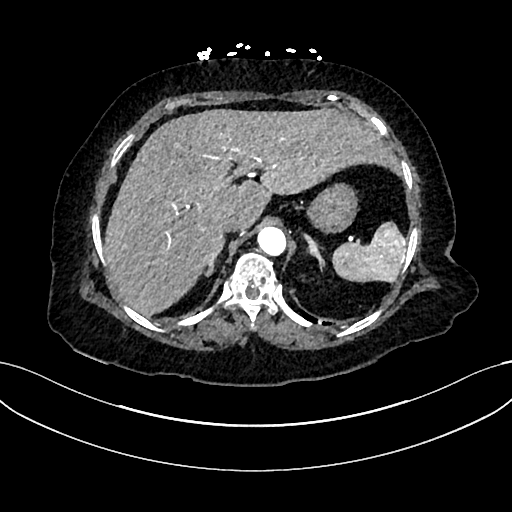
[im 32/136  lung]
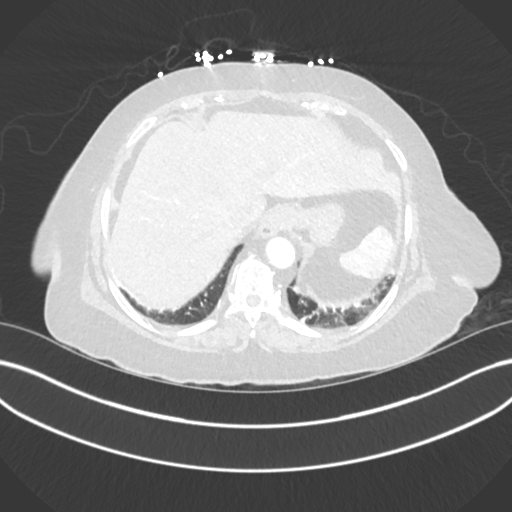
[im 42/136  mediastinal]
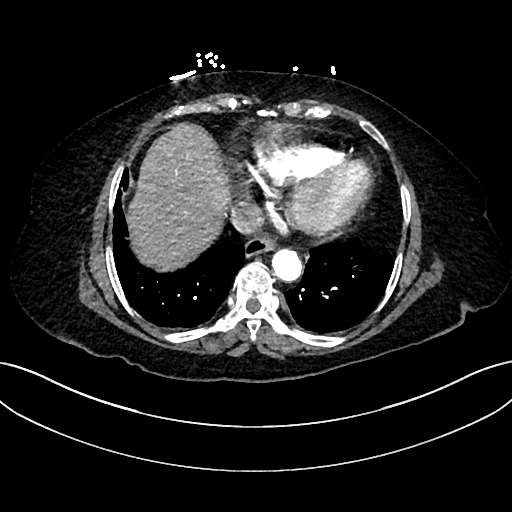
[im 52/136  lung]
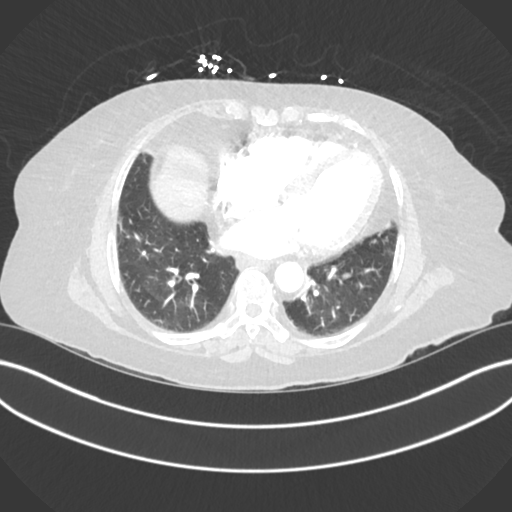
[im 63/136  mediastinal]
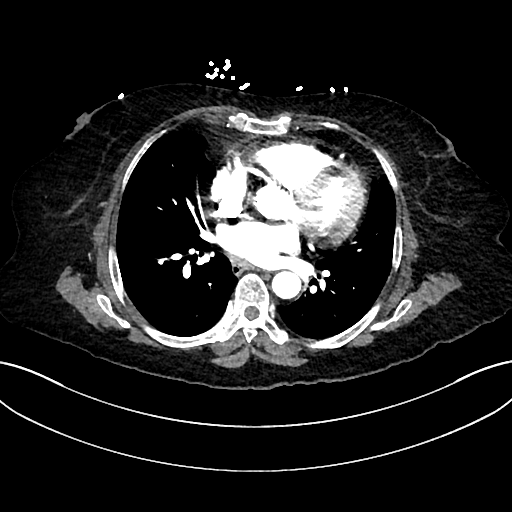
[im 73/136  lung]
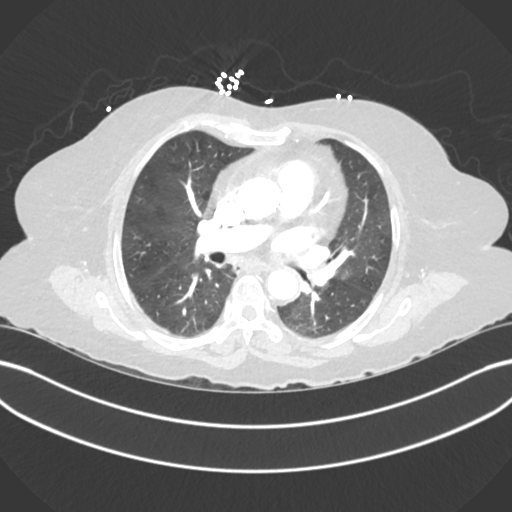
[im 84/136  mediastinal]
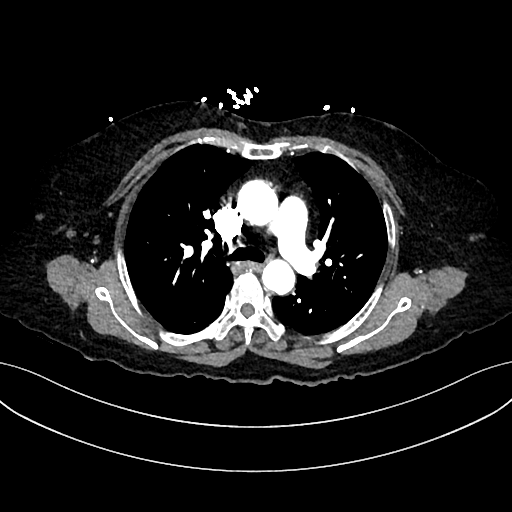
[im 94/136  lung]
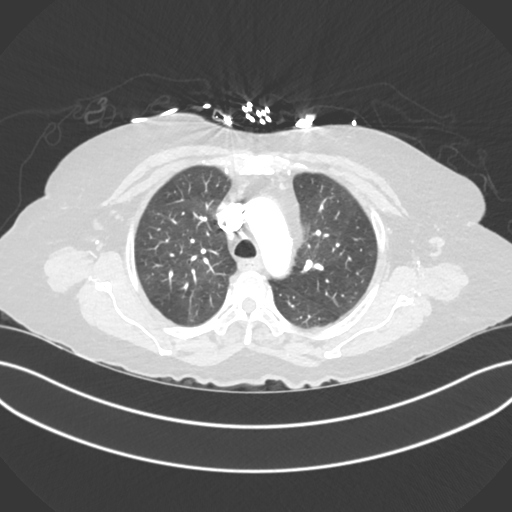
[im 104/136  mediastinal]
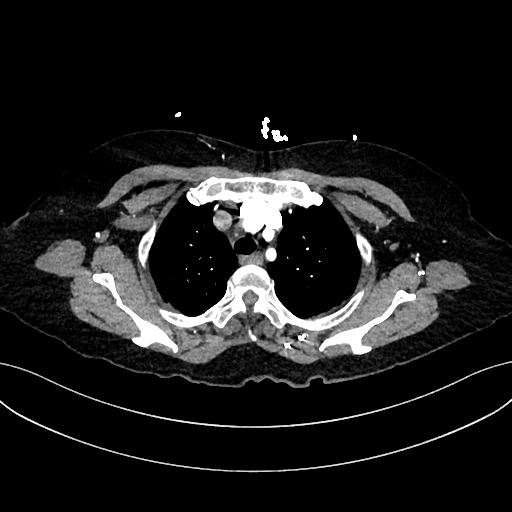
[im 115/136  lung]
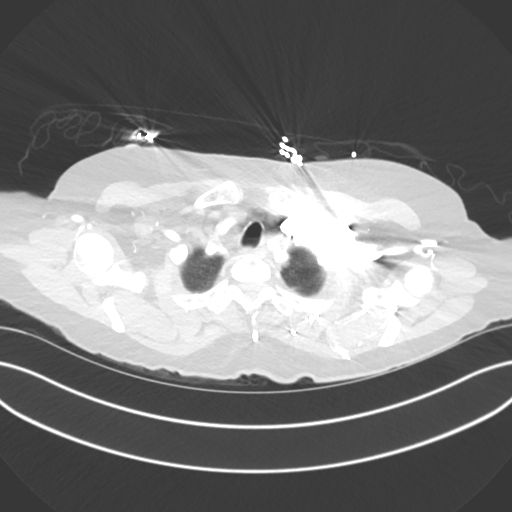
[im 125/136  mediastinal]
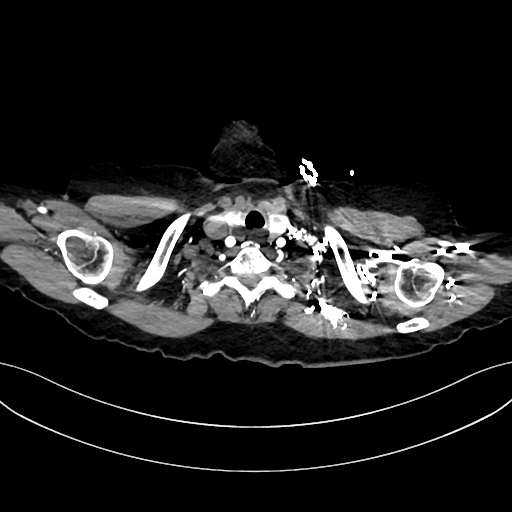

[Series 6: lung · axial · 0.77mm/px · z∈[-208,-42]mm · 6 of 136 slices shown]
[im 11/136  mediastinal]
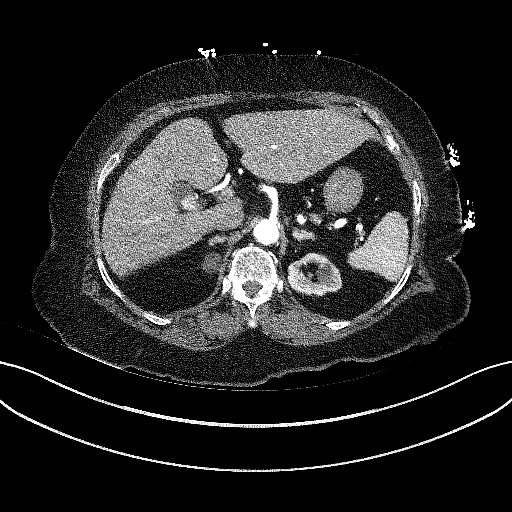
[im 32/136  mediastinal]
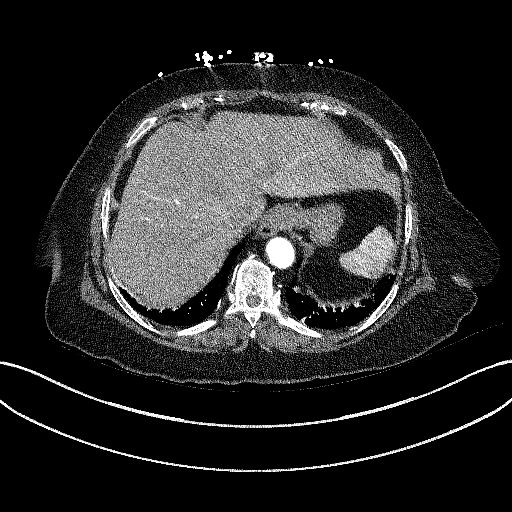
[im 42/136  mediastinal]
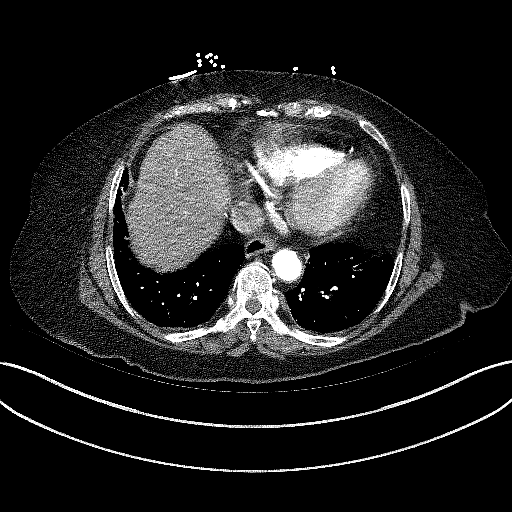
[im 63/136  mediastinal]
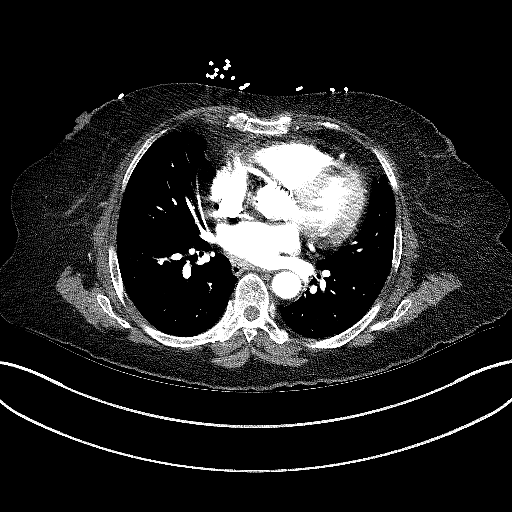
[im 73/136  mediastinal]
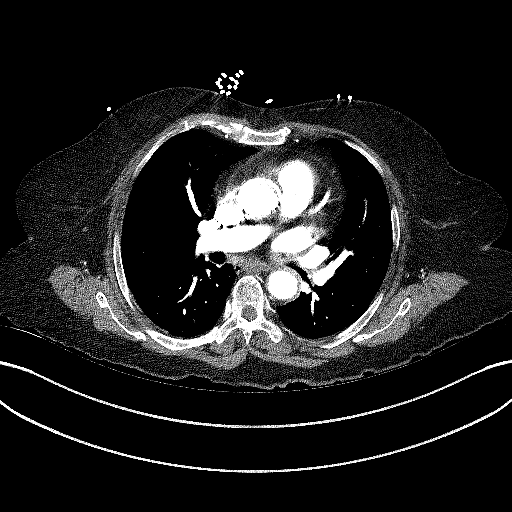
[im 94/136  mediastinal]
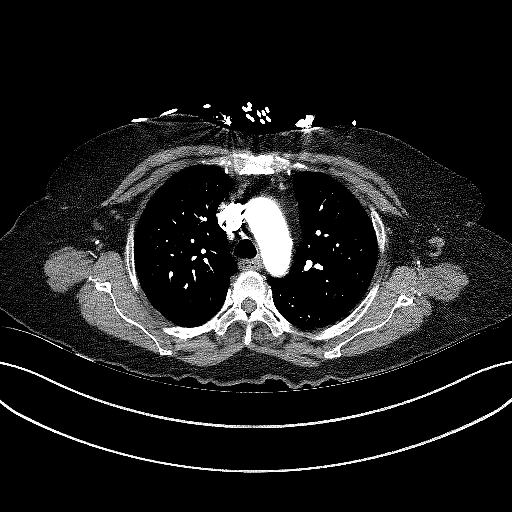

[Series 8: cor · coronal · 0.57mm/px · 1 of 151 slices shown]
[im 76/151  mediastinal]
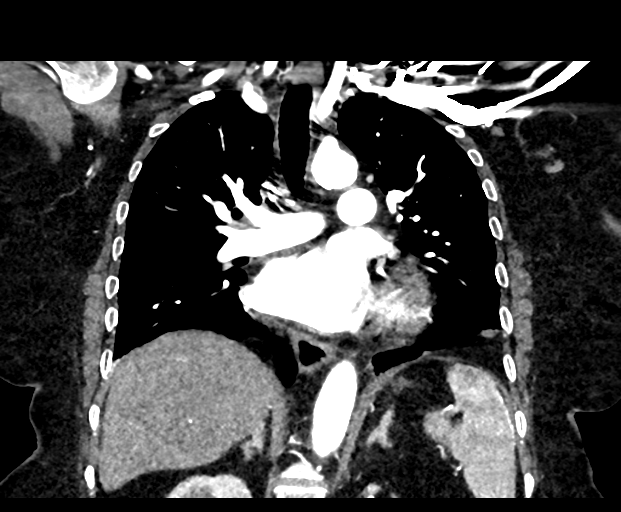

[19 of 36 positions shown; findings below may reference images not displayed]

FINDINGS: Cardiovascular: Ascending aortic stent in place. No evidence of
aortic aneurysm with maximum aortic diameter in the ascending
thoracic aorta 2.9 cm. No dissection. Heart is normal size. Coronary
artery calcifications in the left anterior descending coronary
artery. No filling defects in the pulmonary arteries to suggest
pulmonary emboli.

Mediastinum/Nodes: No mediastinal, hilar, or axillary adenopathy.
Trachea and esophagus are unremarkable. Thyroid unremarkable.

Lungs/Pleura: Left basilar atelectasis. No confluent opacities or
effusions.

Upper Abdomen: Gallstones within the gallbladder.  No acute findings

Musculoskeletal: Chest wall soft tissues are unremarkable. No acute
bony abnormality.

Review of the MIP images confirms the above findings.
IMPRESSION: No evidence of aortic aneurysm or dissection. Prior stent placement
in the ascending thoracic aorta.

No evidence of pulmonary embolus.

Coronary artery disease.

Cholelithiasis.

Aortic Atherosclerosis ([id number]-AZ2.2).

## 2024-01-15 ENCOUNTER — Telehealth: Payer: Self-pay | Admitting: Cardiovascular Disease

## 2024-01-15 NOTE — Telephone Encounter (Signed)
 New Message:       Krystal Maddox wants to know if patient needs to stop any of her medicine for  her office visit on Monday(01-18-24)?

## 2024-01-15 NOTE — Telephone Encounter (Signed)
 Called Doylene Genet the Child psychotherapist with [employer]. She wanted to make sure of what patient needed to do for her appointment  or any medications she needs to hold or take. Informed her that it might be a good idea to have the patient fasting at her appointment since there is no up to date lipid panel on her chart. Doylene Genet will make sure patient is fasting. She also wanted to know if we would have an interpreter. Informed her that patient is marked on her chart for needing an interpreter, and we also have a tablet we can dial for an interpreter as well. Tracey verbalized understanding.

## 2024-01-17 NOTE — Progress Notes (Unsigned)
 Cardiology Office Note:    Date:  01/18/2024   ID:  Krystal Maddox, DOB Jul 08, 1941, MRN [id number]  PCP:  [facility]  Cardiologist:  Freight forwarder:  None   Referring MD: [facility]   Chief Complaint  Patient presents with   TAVR     Previous notes:    Krystal Maddox is a 84 y.o. female with a hx of hypertension, heart murmur, shortness of breath, stage III chronic kidney disease.  She was recently found to have aortic stenosis.  We are asked to evaluate her for further evaluation and management of her aortic stenosis.  Seen with interpreter, Lovell Rubenstein   She was previously seen by Dr. Villa Greaser.  More recently she has [facility]. Echocardiogram from November 18, 2019 reveals normal left ventricular systolic function.  She has grade 1 diastolic dysfunction.  She has severe calcification and thickening of the aortic valve.  There is a mean aortic valve gradient of 36 mmHg with a peak gradient of 71.7 mmHg.  She has had a heart murmur for years .  Coughing recently . Has cp when she cough.  Coughs only rarely   No knowledge of rheumatic fever as a child   Has had both covid vaccines.   Has DOE , no cp with exertion  Unable to walk very far due to DOE .    July 24, 2020: Krystal Maddox is a 83 yo [place] .  She  is seen today for follow up of her AS, dyspnea, CKD. She has moderate - severe AS .  Echo from March, 2021 shows normal LV function.  Grade 1 DD.  Peak AS gradient of 71 with  Mean AS gradient of 36.   Is not very active Still has some shortness of breath but says she is feeling better.   Jan 10, 2021:  Krystal Maddox is a 83 yo [place].  Seen with Interpreter, Diah Siu.   She has a hx of aortic stenosis Recent echo shows worsening AV gradient Has had a syncopal episode Some DOE No CP   Dec. 14, 2022 Seen with interpreter, Lillie Reining  Krystal Maddox is seen today for follow up of her  TAVR TAVR - Dec. 6 ER visit - Dec. 9 for pericarditis - WBC of 27,000  ECG shows some persistent ST elevation - improved since Dec. 9.   Complains of constipation / indigestion  Will try omeprazole  for 1 month and sugar free metamucil   Feb 04, 2022 Krystal Maddox is seen an 83 yo Montagnard patient seen  for follow up of her TAVR ( Dec. 6, 2022)  Complicated by pericarditis  Seen with interpreter,  Jackqueline Mason  Is feeling better   Has generalized aches in her legs.  Ive asked her to speak to her primary MD about this  BP looks great.   She has stopped her losartan  - HCTZ   Jan 13, 2023 Krystal Maddox is seen for follow up of her AS , S/pTAVR. Complicated by pericarditis  Seen with interpreter, Sharee Dates [facility])    Jan 18, 2024 Krystal Maddox is seen for follow up of her TAVR Seen with [place] interpreter , Wier.  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 Abel Hoe and Dr Sherene Dilling.   Severe aortic stenosis    Superficial vein thrombosis 05/21/2021   R arm   Vitamin D deficiency     Past Surgical History:  Procedure Laterality Date   EYE SURGERY Left 2022   cataract   INTRAOPERATIVE TRANSTHORACIC ECHOCARDIOGRAM N/A 08/13/2021   Procedure: INTRAOPERATIVE TRANSTHORACIC ECHOCARDIOGRAM;  Surgeon: Odie Benne, MD;  [facility];  Service: Open Heart Surgery;  Laterality: N/A;   KNEE SURGERY Left     RIGHT/LEFT HEART CATH AND CORONARY ANGIOGRAPHY N/A 05/17/2021   Procedure: RIGHT/LEFT HEART CATH AND CORONARY ANGIOGRAPHY;  Surgeon: Odie Benne, MD;  Location: [facility];  Service: Cardiovascular;  Laterality: N/A;   TRANSCATHETER AORTIC VALVE REPLACEMENT, TRANSFEMORAL N/A 08/13/2021   Procedure: TRANSCATHETER AORTIC VALVE REPLACEMENT, TRANSFEMORAL;  Surgeon: Odie Benne, MD;  [facility]: [facility];  Service: Open Heart Surgery;  Laterality: N/A;    Current Medications: Current Meds  Medication Sig   amLODipine  (NORVASC ) 10 MG tablet Take 10 mg by mouth daily.    amoxicillin  (AMOXIL ) 500 MG tablet Take 4 tablets by mouth 1 hour prior to dental procedures and cleanings   aspirin  EC 81 MG tablet Take 81 mg by mouth daily.   Calcium Carbonate-Vit D-Min (GNP CALCIUM 600 +D/MINERALS) 600-800 MG-UNIT TABS Take 1 tablet by mouth daily.   diphenhydrAMINE  (BENADRYL ) 25 MG tablet Take 25 mg by mouth every 8 (eight) hours as needed.   ferrous sulfate  325 (65 FE) MG tablet Take 325 mg by mouth daily.   furosemide (LASIX) 20 MG tablet Take 20 mg by mouth.   glipiZIDE (GLUCOTROL) 5 MG tablet Take 10 mg by mouth 2 (two) times daily.   Iron-Vitamin C (VITRON-C PO) Take 1 tablet by mouth daily in the afternoon.   linagliptin (TRADJENTA) 5 MG TABS tablet Take 5 mg by mouth daily.   losartan  (COZAAR ) 25 MG tablet Take 50 mg by mouth daily.   losartan -hydrochlorothiazide (HYZAAR) 100-12.5 MG tablet Take 1 tablet by mouth daily.   Menthol, Topical Analgesic, (BIOFREEZE EX) Apply 1 application  topically 4 (four) times daily as needed (joint/muscle pain). 3.5 % Gel   Menthol-Methyl Salicylate (SALONPAS PAIN RELIEF PATCH EX) Place 1 patch onto the skin every 8 (eight) hours as needed (pain).   Multiple Vitamins-Minerals (PRESERVISION AREDS 2 PO) Take 1 tablet by mouth in the morning and at bedtime.   pantoprazole  (PROTONIX ) 40 MG tablet Take 1 tablet (40 mg total) by mouth  daily.   Polyethyl Glyc-Propyl Glyc PF (SYSTANE HYDRATION PF) 0.4-0.3 % SOLN Place 1 drop into both eyes 4 (four) times daily as needed (dry eyes).   pravastatin  (PRAVACHOL ) 80 MG tablet Take 80 mg by mouth daily.   prednisoLONE  acetate (PRED FORTE ) 1 % ophthalmic suspension Place 1 drop into the left eye 4 (four) times daily.   psyllium (METAMUCIL) 0.52 g capsule Take 1 capsule (0.52 g total) by mouth at  bedtime.   senna-docusate (SENOKOT-S) 8.6-50 MG tablet Take 2 tablets by mouth at bedtime.   Skin Protectants, Misc. (EUCERIN) cream Apply 1 application  topically in the morning and at bedtime.   sodium bicarbonate 650 MG tablet Take 650 mg by mouth 2 (two) times daily.   tolnaftate (TINACTIN) 1 % spray Apply 1 spray topically daily. Applied to feet     Allergies:   Tylenol  [acetaminophen ]   Social History   Socioeconomic History   Marital status: Married    Spouse name: Not on file   Number of children: 5   Years of education: Not on file   Highest education level: Not on file  Occupational History   Not on file  Tobacco Use   Smoking status: Unknown   Smokeless tobacco: Never  Vaping Use   Vaping status: Never Used  Substance and Sexual Activity   Alcohol use: Never   Drug use: Never   Sexual activity: Not on file  Other Topics Concern   Not on file  Social History Narrative   ** Merged History Encounter **       Social Drivers of Health   Financial Resource Strain: Not on file  Food Insecurity: Not on file  Transportation Needs: Not on file  Physical Activity: Not on file  Stress: Not on file  Social Connections: Not on file     Family History: The patient's family history includes Hypertension in her mother.  Family is in [place] .  She is a  Counselling psychologist   No known medical histyory.   Family did not go to the doctor   ROS:   Please see the history of present illness.     All other systems reviewed and are negative.  EKGs/Labs/Other Studies Reviewed:     The following studies were reviewed today:   EKG:     EKG Interpretation Date/Time:  Monday Jan 18 2024 09:15:17 EDT Ventricular Rate:  88 PR Interval:  208 QRS Duration:  76 QT Interval:  382 QTC Calculation: 462 R Axis:   39  Text Interpretation: Normal sinus rhythm Cannot rule out Anterior infarct , age undetermined When compared with ECG of 16-Aug-2021 15:52, No significant change since last tracing Confirmed by Ahmad Alert [id number]) on 01/18/2024 9:50:12 AM       Recent Labs: No results found for requested labs within last 365 days.  Recent Lipid Panel No results found for: "CHOL", "TRIG", "HDL", "CHOLHDL", "VLDL", "LDLCALC", "LDLDIRECT"  Physical Exam:     Physical Exam: Blood pressure (!) 164/74, pulse 88, height 4\' 11"  (1.499 m), weight 152 lb 12.8 oz (69.3 kg), SpO2 98%.     GEN:  Well nourished, well developed in no acute distress HEENT: Normal NECK: No JVD; No carotid bruits LYMPHATICS: No lymphadenopathy CARDIAC: RRR 1-2/6 systolic murmur  RESPIRATORY:  Clear to auscultation without rales, wheezing or rhonchi  ABDOMEN: Soft, non-tender, non-distended MUSCULOSKELETAL:  No edema; No deformity  SKIN: Warm and dry NEUROLOGIC:  Alert and oriented x 3    ASSESSMENT:    1. Primary hypertension   2. Aortic valve stenosis, etiology of cardiac valve disease unspecified        PLAN:       Aortic stenosis:  s/p TAVR ,   stable   2.  HTN ;   has not taken BP meds yet today .  Eats a very salty diet  Encouraged her to reduce her salt intake.  She eats instant noodles almost every  morning for breakfast.  Encouraged her to eat something more healthy as this will help with her blood pressure.      Medication Adjustments/Labs and Tests Ordered: Current medicines are reviewed at length with the patient today.  Concerns regarding medicines are outlined above.  Orders Placed This Encounter  Procedures   Basic metabolic panel with GFR   EKG 82-NFAO     No orders of the defined types were placed in this encounter.      Patient Instructions  Lab Work: BMET today If you have labs (blood work) drawn today and your tests are completely normal, you will receive your results only by: MyChart Message (if you have MyChart) OR A paper copy in the mail If you have any lab test that is abnormal or we need to change your treatment, we will call you to review the results.  Follow-Up: At [facility], you and your health needs are our priority.  As part of our continuing mission to provide you with exceptional heart care, our providers are all part of one team.  This team includes your primary Cardiologist (physician) and Advanced Practice Providers or APPs (Physician Assistants and Nurse Practitioners) who all work together to provide you with the care you need, when you need it.  Your next appointment:   6 month(s)  Provider:   One of our Advanced Practice Providers (APPs): Melita Springer, PA-C  Friddie Jetty, NP Evaline Hill, NP  Theotis Flake, PA-C Lawana Pray, NP  Willis Harter, PA-C Lovette Rud, PA-C  [place], [place] Charles Connor, NP  Marlana Silvan, NP Marcie Sever, PA-C  Laquita Plant, PA-C    Dayna Dunn, PA-C  Marlyse Single, PA-C Palmer Bobo, NP Katlyn West, NP Callie Goodrich, PA-C  Evan Williams, PA-C Sheng Haley, PA-C  Xika Zhao, NP Liane Redman, PA-C      Signed, Ahmad Alert, MD  01/18/2024 10:02 AM    [facility]

## 2024-01-18 ENCOUNTER — Ambulatory Visit: Payer: Medicare (Managed Care) | Attending: Cardiovascular Disease | Admitting: Cardiovascular Disease

## 2024-01-18 ENCOUNTER — Encounter: Payer: Self-pay | Admitting: Cardiovascular Disease

## 2024-01-18 VITALS — BP 164/74 | HR 88 | Ht 59.0 in | Wt 152.8 lb

## 2024-01-18 DIAGNOSIS — I10 Essential (primary) hypertension: Secondary | ICD-10-CM

## 2024-01-18 DIAGNOSIS — Z952 Presence of prosthetic heart valve: Secondary | ICD-10-CM

## 2024-01-18 DIAGNOSIS — I350 Nonrheumatic aortic (valve) stenosis: Secondary | ICD-10-CM

## 2024-01-18 NOTE — Patient Instructions (Signed)
 Lab Work: BMET today If you have labs (blood work) drawn today and your tests are completely normal, you will receive your results only by: [employer] (if you have MyChart) OR A paper copy in the mail If you have any lab test that is abnormal or we need to change your treatment, we will call you to review the results.  Follow-Up: At [facility], you and your health needs are our priority.  As part of our continuing mission to provide you with exceptional heart care, our providers are all part of one team.  This team includes your primary Cardiologist (physician) and Advanced Practice Providers or APPs (Physician Assistants and Nurse Practitioners) who all work together to provide you with the care you need, when you need it.  Your next appointment:   6 month(s)  Provider:   One of our Advanced Practice Providers (APPs): Melita Springer, PA-C  Friddie Jetty, NP Evaline Hill, NP  Theotis Flake, PA-C Lawana Pray, NP  Willis Harter, PA-C Lovette Rud, PA-C  [place], [place] Charles Connor, NP  Marlana Silvan, NP Marcie Sever, PA-C  Laquita Plant, PA-C    Dayna Dunn, PA-C  Marlyse Single, PA-C Palmer Bobo, NP Katlyn West, NP Callie Goodrich, PA-C  Evan Williams, PA-C Sheng Haley, PA-C  Xika Zhao, NP Kathleen Johnson, PA-C

## 2024-01-19 LAB — BASIC METABOLIC PANEL WITH GFR
BUN/Creatinine Ratio: 18 (ref 12–28)
BUN: 23 mg/dL (ref 8–27)
CO2: 23 mmol/L (ref 20–29)
Calcium: 9.3 mg/dL (ref 8.7–10.3)
Chloride: 100 mmol/L (ref 96–106)
Creatinine, Ser: 1.26 mg/dL — ABNORMAL HIGH (ref 0.57–1.00)
Glucose: 141 mg/dL — ABNORMAL HIGH (ref 70–99)
Potassium: 5 mmol/L (ref 3.5–5.2)
Sodium: 137 mmol/L (ref 134–144)
eGFR: 42 mL/min/{1.73_m2} — ABNORMAL LOW (ref >59)

## 2024-01-20 ENCOUNTER — Ambulatory Visit: Payer: Self-pay | Admitting: Cardiovascular Disease

## 2024-03-12 ENCOUNTER — Inpatient Hospital Stay (HOSPITAL_COMMUNITY)
Admission: EM | Admit: 2024-03-12 | Discharge: 2024-03-18 | DRG: 640 | Disposition: A | Discharge status: Home Health | Payer: Medicare (Managed Care) | Attending: Internal Medicine | Admitting: Internal Medicine

## 2024-03-12 ENCOUNTER — Emergency Department (HOSPITAL_COMMUNITY): Payer: Medicare (Managed Care)

## 2024-03-12 ENCOUNTER — Other Ambulatory Visit: Payer: Self-pay

## 2024-03-12 DIAGNOSIS — Z79899 Other long term (current) drug therapy: Secondary | ICD-10-CM

## 2024-03-12 DIAGNOSIS — E669 Obesity, unspecified: Secondary | ICD-10-CM | POA: Diagnosis present

## 2024-03-12 DIAGNOSIS — R739 Hyperglycemia, unspecified: Secondary | ICD-10-CM | POA: Diagnosis not present

## 2024-03-12 DIAGNOSIS — I272 Pulmonary hypertension, unspecified: Secondary | ICD-10-CM | POA: Diagnosis present

## 2024-03-12 DIAGNOSIS — D124 Benign neoplasm of descending colon: Secondary | ICD-10-CM | POA: Diagnosis not present

## 2024-03-12 DIAGNOSIS — K219 Gastro-esophageal reflux disease without esophagitis: Secondary | ICD-10-CM | POA: Diagnosis present

## 2024-03-12 DIAGNOSIS — E875 Hyperkalemia: Secondary | ICD-10-CM | POA: Diagnosis present

## 2024-03-12 DIAGNOSIS — E785 Hyperlipidemia, unspecified: Secondary | ICD-10-CM | POA: Diagnosis present

## 2024-03-12 DIAGNOSIS — D62 Acute posthemorrhagic anemia: Secondary | ICD-10-CM | POA: Diagnosis not present

## 2024-03-12 DIAGNOSIS — Z603 Acculturation difficulty: Secondary | ICD-10-CM | POA: Diagnosis present

## 2024-03-12 DIAGNOSIS — M810 Age-related osteoporosis without current pathological fracture: Secondary | ICD-10-CM | POA: Diagnosis present

## 2024-03-12 DIAGNOSIS — E1165 Type 2 diabetes mellitus with hyperglycemia: Secondary | ICD-10-CM | POA: Diagnosis present

## 2024-03-12 DIAGNOSIS — I130 Hypertensive heart and chronic kidney disease with heart failure and stage 1 through stage 4 chronic kidney disease, or unspecified chronic kidney disease: Secondary | ICD-10-CM | POA: Diagnosis present

## 2024-03-12 DIAGNOSIS — E1151 Type 2 diabetes mellitus with diabetic peripheral angiopathy without gangrene: Secondary | ICD-10-CM | POA: Diagnosis present

## 2024-03-12 DIAGNOSIS — K2920 Alcoholic gastritis without bleeding: Secondary | ICD-10-CM | POA: Diagnosis present

## 2024-03-12 DIAGNOSIS — R109 Unspecified abdominal pain: Secondary | ICD-10-CM

## 2024-03-12 DIAGNOSIS — Z952 Presence of prosthetic heart valve: Secondary | ICD-10-CM

## 2024-03-12 DIAGNOSIS — Q438 Other specified congenital malformations of intestine: Secondary | ICD-10-CM | POA: Diagnosis not present

## 2024-03-12 DIAGNOSIS — E8809 Other disorders of plasma-protein metabolism, not elsewhere classified: Secondary | ICD-10-CM | POA: Diagnosis present

## 2024-03-12 DIAGNOSIS — E11649 Type 2 diabetes mellitus with hypoglycemia without coma: Secondary | ICD-10-CM | POA: Diagnosis not present

## 2024-03-12 DIAGNOSIS — K6289 Other specified diseases of anus and rectum: Secondary | ICD-10-CM | POA: Diagnosis not present

## 2024-03-12 DIAGNOSIS — Z886 Allergy status to analgesic agent status: Secondary | ICD-10-CM

## 2024-03-12 DIAGNOSIS — I2510 Atherosclerotic heart disease of native coronary artery without angina pectoris: Secondary | ICD-10-CM | POA: Diagnosis present

## 2024-03-12 DIAGNOSIS — D631 Anemia in chronic kidney disease: Secondary | ICD-10-CM | POA: Diagnosis present

## 2024-03-12 DIAGNOSIS — N1832 Chronic kidney disease, stage 3b: Secondary | ICD-10-CM | POA: Diagnosis present

## 2024-03-12 DIAGNOSIS — G8929 Other chronic pain: Secondary | ICD-10-CM | POA: Diagnosis present

## 2024-03-12 DIAGNOSIS — E1122 Type 2 diabetes mellitus with diabetic chronic kidney disease: Secondary | ICD-10-CM | POA: Diagnosis present

## 2024-03-12 DIAGNOSIS — D122 Benign neoplasm of ascending colon: Secondary | ICD-10-CM | POA: Diagnosis present

## 2024-03-12 DIAGNOSIS — I5033 Acute on chronic diastolic (congestive) heart failure: Secondary | ICD-10-CM | POA: Diagnosis present

## 2024-03-12 DIAGNOSIS — K2970 Gastritis, unspecified, without bleeding: Secondary | ICD-10-CM | POA: Diagnosis not present

## 2024-03-12 DIAGNOSIS — R195 Other fecal abnormalities: Secondary | ICD-10-CM

## 2024-03-12 DIAGNOSIS — I340 Nonrheumatic mitral (valve) insufficiency: Secondary | ICD-10-CM | POA: Diagnosis not present

## 2024-03-12 DIAGNOSIS — D649 Anemia, unspecified: Secondary | ICD-10-CM

## 2024-03-12 DIAGNOSIS — E871 Hypo-osmolality and hyponatremia: Principal | ICD-10-CM | POA: Diagnosis present

## 2024-03-12 DIAGNOSIS — Z7984 Long term (current) use of oral hypoglycemic drugs: Secondary | ICD-10-CM | POA: Diagnosis not present

## 2024-03-12 DIAGNOSIS — K5521 Angiodysplasia of colon with hemorrhage: Secondary | ICD-10-CM | POA: Diagnosis not present

## 2024-03-12 DIAGNOSIS — I5032 Chronic diastolic (congestive) heart failure: Secondary | ICD-10-CM | POA: Diagnosis not present

## 2024-03-12 DIAGNOSIS — D500 Iron deficiency anemia secondary to blood loss (chronic): Secondary | ICD-10-CM | POA: Diagnosis present

## 2024-03-12 DIAGNOSIS — D509 Iron deficiency anemia, unspecified: Secondary | ICD-10-CM | POA: Diagnosis not present

## 2024-03-12 DIAGNOSIS — Z6828 Body mass index (BMI) 28.0-28.9, adult: Secondary | ICD-10-CM

## 2024-03-12 DIAGNOSIS — N1831 Chronic kidney disease, stage 3a: Secondary | ICD-10-CM | POA: Diagnosis not present

## 2024-03-12 DIAGNOSIS — I350 Nonrheumatic aortic (valve) stenosis: Secondary | ICD-10-CM

## 2024-03-12 DIAGNOSIS — Z7982 Long term (current) use of aspirin: Secondary | ICD-10-CM

## 2024-03-12 DIAGNOSIS — I361 Nonrheumatic tricuspid (valve) insufficiency: Secondary | ICD-10-CM | POA: Diagnosis not present

## 2024-03-12 DIAGNOSIS — I10 Essential (primary) hypertension: Secondary | ICD-10-CM | POA: Diagnosis present

## 2024-03-12 DIAGNOSIS — K2950 Unspecified chronic gastritis without bleeding: Secondary | ICD-10-CM | POA: Diagnosis not present

## 2024-03-12 DIAGNOSIS — I129 Hypertensive chronic kidney disease with stage 1 through stage 4 chronic kidney disease, or unspecified chronic kidney disease: Secondary | ICD-10-CM | POA: Diagnosis not present

## 2024-03-12 DIAGNOSIS — E878 Other disorders of electrolyte and fluid balance, not elsewhere classified: Secondary | ICD-10-CM | POA: Diagnosis not present

## 2024-03-12 DIAGNOSIS — R1030 Lower abdominal pain, unspecified: Secondary | ICD-10-CM | POA: Diagnosis present

## 2024-03-12 DIAGNOSIS — Z953 Presence of xenogenic heart valve: Secondary | ICD-10-CM | POA: Diagnosis not present

## 2024-03-12 DIAGNOSIS — Z8249 Family history of ischemic heart disease and other diseases of the circulatory system: Secondary | ICD-10-CM

## 2024-03-12 DIAGNOSIS — I509 Heart failure, unspecified: Principal | ICD-10-CM

## 2024-03-12 DIAGNOSIS — I739 Peripheral vascular disease, unspecified: Secondary | ICD-10-CM | POA: Diagnosis present

## 2024-03-12 DIAGNOSIS — N183 Chronic kidney disease, stage 3 unspecified: Secondary | ICD-10-CM | POA: Diagnosis present

## 2024-03-12 LAB — PROTIME-INR
INR: 1 (ref 0.8–1.2)
Prothrombin Time: 13.9 s (ref 11.4–15.2)

## 2024-03-12 LAB — MRSA NEXT GEN BY PCR, NASAL: MRSA by PCR Next Gen: NOT DETECTED

## 2024-03-12 LAB — BASIC METABOLIC PANEL WITH GFR
Anion gap: 11 (ref 5–15)
Anion gap: 11 (ref 5–15)
Anion gap: 11 (ref 5–15)
BUN: 37 mg/dL — ABNORMAL HIGH (ref 8–23)
BUN: 36 mg/dL — ABNORMAL HIGH (ref 8–23)
BUN: 36 mg/dL — ABNORMAL HIGH (ref 8–23)
CO2: 22 mmol/L (ref 22–32)
CO2: 22 mmol/L (ref 22–32)
CO2: 23 mmol/L (ref 22–32)
Calcium: 8.1 mg/dL — ABNORMAL LOW (ref 8.9–10.3)
Calcium: 8.3 mg/dL — ABNORMAL LOW (ref 8.9–10.3)
Calcium: 8.3 mg/dL — ABNORMAL LOW (ref 8.9–10.3)
Chloride: 73 mmol/L — ABNORMAL LOW (ref 98–111)
Chloride: 76 mmol/L — ABNORMAL LOW (ref 98–111)
Chloride: 77 mmol/L — ABNORMAL LOW (ref 98–111)
Creatinine, Ser: 1.19 mg/dL — ABNORMAL HIGH (ref 0.44–1.00)
Creatinine, Ser: 1.12 mg/dL — ABNORMAL HIGH (ref 0.44–1.00)
Creatinine, Ser: 1.15 mg/dL — ABNORMAL HIGH (ref 0.44–1.00)
GFR, Estimated: 45 mL/min — ABNORMAL LOW (ref >60)
GFR, Estimated: 48 mL/min — ABNORMAL LOW
GFR, Estimated: 47 mL/min — ABNORMAL LOW (ref >60)
Glucose, Bld: 175 mg/dL — ABNORMAL HIGH (ref 70–99)
Glucose, Bld: 127 mg/dL — ABNORMAL HIGH (ref 70–99)
Glucose, Bld: 116 mg/dL — ABNORMAL HIGH (ref 70–99)
Potassium: 5.1 mmol/L (ref 3.5–5.1)
Potassium: 5 mmol/L (ref 3.5–5.1)
Potassium: 4.7 mmol/L (ref 3.5–5.1)
Sodium: 106 mmol/L — CL (ref 135–145)
Sodium: 109 mmol/L — CL (ref 135–145)
Sodium: 111 mmol/L — CL (ref 135–145)

## 2024-03-12 LAB — URINALYSIS, ROUTINE W REFLEX MICROSCOPIC
Bilirubin Urine: NEGATIVE
Glucose, UA: NEGATIVE mg/dL
Hgb urine dipstick: NEGATIVE
Ketones, ur: NEGATIVE mg/dL
Leukocytes,Ua: NEGATIVE
Nitrite: NEGATIVE
Protein, ur: 100 mg/dL — AB
Specific Gravity, Urine: 1.01 (ref 1.005–1.030)
pH: 6 (ref 5.0–8.0)

## 2024-03-12 LAB — CBC WITH DIFFERENTIAL/PLATELET
Abs Immature Granulocytes: 0.13 K/uL — ABNORMAL HIGH (ref 0.00–0.07)
Basophils Absolute: 0 K/uL (ref 0.0–0.1)
Basophils Relative: 0 %
Eosinophils Absolute: 0 K/uL (ref 0.0–0.5)
Eosinophils Relative: 0 %
HCT: 18 % — ABNORMAL LOW (ref 36.0–46.0)
Hemoglobin: 5.7 g/dL — CL (ref 12.0–15.0)
Immature Granulocytes: 1 %
Lymphocytes Relative: 11 %
Lymphs Abs: 1.5 K/uL (ref 0.7–4.0)
MCH: 21.4 pg — ABNORMAL LOW (ref 26.0–34.0)
MCHC: 31.7 g/dL (ref 30.0–36.0)
MCV: 67.7 fL — ABNORMAL LOW (ref 80.0–100.0)
Monocytes Absolute: 1 K/uL (ref 0.1–1.0)
Monocytes Relative: 7 %
Neutro Abs: 10.7 K/uL — ABNORMAL HIGH (ref 1.7–7.7)
Neutrophils Relative %: 81 %
Platelets: 366 K/uL (ref 150–400)
RBC: 2.66 MIL/uL — ABNORMAL LOW (ref 3.87–5.11)
RDW: 17.3 % — ABNORMAL HIGH (ref 11.5–15.5)
WBC: 13.3 K/uL — ABNORMAL HIGH (ref 4.0–10.5)
nRBC: 0 % (ref 0.0–0.2)

## 2024-03-12 LAB — RETICULOCYTES
Immature Retic Fract: 17.2 % — ABNORMAL HIGH (ref 2.3–15.9)
RBC.: 2.57 MIL/uL — ABNORMAL LOW (ref 3.87–5.11)
Retic Count, Absolute: 90 K/uL (ref 19.0–186.0)
Retic Ct Pct: 3.5 % — ABNORMAL HIGH (ref 0.4–3.1)

## 2024-03-12 LAB — PROTEIN / CREATININE RATIO, URINE
Creatinine, Urine: <10 mg/dL
Total Protein, Urine: 16 mg/dL

## 2024-03-12 LAB — IRON AND TIBC
Iron: 9 ug/dL — ABNORMAL LOW (ref 28–170)
Saturation Ratios: 2 % — ABNORMAL LOW (ref 10.4–31.8)
TIBC: 447 ug/dL (ref 250–450)
UIBC: 438 ug/dL

## 2024-03-12 LAB — CBC
HCT: 20.6 % — ABNORMAL LOW (ref 36.0–46.0)
Hemoglobin: 7 g/dL — ABNORMAL LOW (ref 12.0–15.0)
MCH: 23.4 pg — ABNORMAL LOW (ref 26.0–34.0)
MCHC: 34 g/dL (ref 30.0–36.0)
MCV: 68.9 fL — ABNORMAL LOW (ref 80.0–100.0)
Platelets: 378 K/uL (ref 150–400)
RBC: 2.99 MIL/uL — ABNORMAL LOW (ref 3.87–5.11)
RDW: 18.9 % — ABNORMAL HIGH (ref 11.5–15.5)
WBC: 13.6 K/uL — ABNORMAL HIGH (ref 4.0–10.5)
nRBC: 0 % (ref 0.0–0.2)

## 2024-03-12 LAB — COMPREHENSIVE METABOLIC PANEL WITH GFR
ALT: 31 U/L (ref 0–44)
AST: 33 U/L (ref 15–41)
Albumin: 2.7 g/dL — ABNORMAL LOW (ref 3.5–5.0)
Alkaline Phosphatase: 115 U/L (ref 38–126)
Anion gap: 15 (ref 5–15)
BUN: 38 mg/dL — ABNORMAL HIGH (ref 8–23)
CO2: 20 mmol/L — ABNORMAL LOW (ref 22–32)
Calcium: 8.1 mg/dL — ABNORMAL LOW (ref 8.9–10.3)
Chloride: 74 mmol/L — ABNORMAL LOW (ref 98–111)
Creatinine, Ser: 1.13 mg/dL — ABNORMAL HIGH (ref 0.44–1.00)
GFR, Estimated: 48 mL/min — ABNORMAL LOW (ref >60)
Glucose, Bld: 205 mg/dL — ABNORMAL HIGH (ref 70–99)
Potassium: 6 mmol/L — ABNORMAL HIGH (ref 3.5–5.1)
Sodium: 109 mmol/L — CL (ref 135–145)
Total Bilirubin: 0.5 mg/dL (ref 0.0–1.2)
Total Protein: 7.5 g/dL (ref 6.5–8.1)

## 2024-03-12 LAB — TROPONIN I (HIGH SENSITIVITY)
Troponin I (High Sensitivity): 12 ng/L (ref <18)
Troponin I (High Sensitivity): 12 ng/L (ref <18)

## 2024-03-12 LAB — MAGNESIUM
Magnesium: 2.7 mg/dL — ABNORMAL HIGH (ref 1.7–2.4)
Magnesium: 2.4 mg/dL (ref 1.7–2.4)

## 2024-03-12 LAB — SODIUM, URINE, RANDOM: Sodium, Ur: 66 mmol/L

## 2024-03-12 LAB — OSMOLALITY
Osmolality: 247 mosm/kg — CL (ref 275–295)
Osmolality: 252 mosm/kg — ABNORMAL LOW (ref 275–295)

## 2024-03-12 LAB — CORTISOL-AM, BLOOD: Cortisol - AM: 21.3 ug/dL (ref 6.7–22.6)

## 2024-03-12 LAB — BRAIN NATRIURETIC PEPTIDE: B Natriuretic Peptide: 795.1 pg/mL — ABNORMAL HIGH (ref 0.0–100.0)

## 2024-03-12 LAB — OSMOLALITY, URINE: Osmolality, Ur: 204 mosm/kg — ABNORMAL LOW (ref 300–900)

## 2024-03-12 LAB — PHOSPHORUS: Phosphorus: 4.5 mg/dL (ref 2.5–4.6)

## 2024-03-12 LAB — PREPARE RBC (CROSSMATCH)

## 2024-03-12 LAB — SODIUM: Sodium: 110 mmol/L — CL (ref 135–145)

## 2024-03-12 LAB — CBG MONITORING, ED: Glucose-Capillary: 201 mg/dL — ABNORMAL HIGH (ref 70–99)

## 2024-03-12 LAB — GLUCOSE, CAPILLARY
Glucose-Capillary: 152 mg/dL — ABNORMAL HIGH (ref 70–99)
Glucose-Capillary: 123 mg/dL — ABNORMAL HIGH (ref 70–99)

## 2024-03-12 LAB — LIPASE, BLOOD: Lipase: 50 U/L (ref 11–51)

## 2024-03-12 LAB — VITAMIN B12: Vitamin B-12: 2263 pg/mL — ABNORMAL HIGH (ref 180–914)

## 2024-03-12 LAB — TSH: TSH: 1.464 u[IU]/mL (ref 0.350–4.500)

## 2024-03-12 LAB — HEMOGLOBIN A1C
Hgb A1c MFr Bld: 6.4 % — ABNORMAL HIGH (ref 4.8–5.6)
Mean Plasma Glucose: 136.98 mg/dL

## 2024-03-12 LAB — POC OCCULT BLOOD, ED: Fecal Occult Bld: POSITIVE — AB

## 2024-03-12 LAB — FOLATE: Folate: 18.1 ng/mL (ref >5.9)

## 2024-03-12 LAB — FERRITIN: Ferritin: 46 ng/mL (ref 11–307)

## 2024-03-12 MED ORDER — INSULIN ASPART 100 UNIT/ML IV SOLN
5.0000 [IU] | Freq: Once | INTRAVENOUS | Status: AC
  Administered 2024-03-12: 5 [IU] via INTRAVENOUS

## 2024-03-12 MED ORDER — CHLORHEXIDINE GLUCONATE CLOTH 2 % EX PADS
6.0000 | MEDICATED_PAD | Freq: Every day | CUTANEOUS | Status: DC
  Administered 2024-03-12 – 2024-03-18 (×8): 6 via TOPICAL

## 2024-03-12 MED ORDER — PANTOPRAZOLE SODIUM 40 MG IV SOLR
40.0000 mg | Freq: Two times a day (BID) | INTRAVENOUS | Status: DC
  Administered 2024-03-12 – 2024-03-18 (×12): 40 mg via INTRAVENOUS
  Filled 2024-03-12 (×12): qty 10

## 2024-03-12 MED ORDER — FUROSEMIDE 10 MG/ML IJ SOLN
40.0000 mg | Freq: Once | INTRAMUSCULAR | Status: AC
  Administered 2024-03-12: 40 mg via INTRAVENOUS
  Filled 2024-03-12: qty 4

## 2024-03-12 MED ORDER — MUSCLE RUB 10-15 % EX CREA
TOPICAL_CREAM | Freq: Four times a day (QID) | CUTANEOUS | Status: DC | PRN
  Filled 2024-03-12 (×2): qty 85

## 2024-03-12 MED ORDER — ORAL CARE MOUTH RINSE: 15.0000 mL | OROMUCOSAL | Status: DC | PRN

## 2024-03-12 MED ORDER — DEXTROSE 50 % IV SOLN
1.0000 | Freq: Once | INTRAVENOUS | Status: AC
  Administered 2024-03-12: 50 mL via INTRAVENOUS
  Filled 2024-03-12: qty 50

## 2024-03-12 MED ORDER — PANTOPRAZOLE SODIUM 40 MG IV SOLR
40.0000 mg | Freq: Once | INTRAVENOUS | Status: AC
  Administered 2024-03-12: 40 mg via INTRAVENOUS
  Filled 2024-03-12: qty 10

## 2024-03-12 MED ORDER — SODIUM ZIRCONIUM CYCLOSILICATE 10 G PO PACK
10.0000 g | PACK | Freq: Once | ORAL | Status: AC
  Administered 2024-03-12: 10 g via ORAL
  Filled 2024-03-12: qty 1

## 2024-03-12 MED ORDER — SODIUM CHLORIDE 0.9% IV SOLUTION: Freq: Once | INTRAVENOUS | Status: AC

## 2024-03-12 MED ORDER — ACETAMINOPHEN 325 MG PO TABS
650.0000 mg | ORAL_TABLET | Freq: Four times a day (QID) | ORAL | Status: DC | PRN
  Administered 2024-03-12 – 2024-03-13 (×3): 650 mg via ORAL
  Filled 2024-03-12 (×3): qty 2

## 2024-03-12 MED ORDER — FUROSEMIDE 10 MG/ML IJ SOLN
40.0000 mg | Freq: Once | INTRAMUSCULAR | Status: DC
  Filled 2024-03-12: qty 4

## 2024-03-12 MED ORDER — SODIUM CHLORIDE 3 % IV SOLN
INTRAVENOUS | Status: DC
  Filled 2024-03-12 (×2): qty 500

## 2024-03-12 MED ORDER — DOCUSATE SODIUM 100 MG PO CAPS: 100.0000 mg | ORAL_CAPSULE | Freq: Two times a day (BID) | ORAL | Status: DC | PRN

## 2024-03-12 MED ORDER — ONDANSETRON HCL 4 MG/2ML IJ SOLN
4.0000 mg | Freq: Once | INTRAMUSCULAR | Status: AC
  Administered 2024-03-12: 4 mg via INTRAVENOUS
  Filled 2024-03-12: qty 2

## 2024-03-12 MED ORDER — POLYETHYLENE GLYCOL 3350 17 G PO PACK: 17.0000 g | PACK | Freq: Every day | ORAL | Status: DC | PRN

## 2024-03-12 MED ORDER — INSULIN ASPART 100 UNIT/ML IJ SOLN
0.0000 [IU] | INTRAMUSCULAR | Status: DC
  Administered 2024-03-12: 3 [IU] via SUBCUTANEOUS
  Administered 2024-03-12: 5 [IU] via SUBCUTANEOUS
  Administered 2024-03-12: 2 [IU] via SUBCUTANEOUS
  Administered 2024-03-13: 5 [IU] via SUBCUTANEOUS
  Administered 2024-03-13 (×2): 2 [IU] via SUBCUTANEOUS
  Administered 2024-03-13 – 2024-03-14 (×2): 3 [IU] via SUBCUTANEOUS
  Administered 2024-03-14: 5 [IU] via SUBCUTANEOUS
  Administered 2024-03-14 (×2): 2 [IU] via SUBCUTANEOUS
  Administered 2024-03-14: 3 [IU] via SUBCUTANEOUS
  Administered 2024-03-15: 2 [IU] via SUBCUTANEOUS
  Administered 2024-03-15: 5 [IU] via SUBCUTANEOUS
  Administered 2024-03-15 (×3): 2 [IU] via SUBCUTANEOUS
  Administered 2024-03-16: 8 [IU] via SUBCUTANEOUS
  Administered 2024-03-16 – 2024-03-18 (×5): 2 [IU] via SUBCUTANEOUS
  Administered 2024-03-18: 3 [IU] via SUBCUTANEOUS

## 2024-03-12 NOTE — ED Notes (Addendum)
 Kidney MD would like urine sample to be collected prior to administration of Lasix .    RN sent printed order requisition to lab to request add on

## 2024-03-12 NOTE — ED Notes (Signed)
 Pt verbalized to son and family friend she agrees to a blood transfusion is needed.

## 2024-03-12 NOTE — ED Notes (Signed)
 Pt nephew at bedside.   Pt states she has lower abdomen pain that make her feel not hungry. Pt says it has been going on for two days. Pt denies N/V  Pt states when she urinate it burns a little bit.   Pt states she took her BP meds this morning.

## 2024-03-12 NOTE — Progress Notes (Addendum)
 Nephrology update: The Na level dropped to 106, the lab was drawn around the time of lasix  so it doesn't reflect the effect of lasix . Repeating Na level now.  Noted urine Na 17, osmolality 272, serum osmo 247. D/w Dr. Joelyn.  Addendum:9:38 PM Repeat Na 110, not much different from admission Na of 109. Start small dose of 3 % saline 25 cc/hr, check lab frequently, goal 115 by noon tomorrow unless acute change in mental status.  Skyleen Bentley Dolan, CKA

## 2024-03-12 NOTE — Consult Note (Addendum)
 [facility] Nephrology Consultation Note  Requesting MD: Dr. Annella Cough Reason for consult: Hyponatremia.  HPI:  Krystal Maddox is a 83 y.o. female with past medical history significant for aortic stenosis status post TAVR, hypertension, CAD, osteoporosis, PVD, chronic hyponatremia presented with generalized abdominal pain and swelling for about a week, seen as a consultation for the evaluation of hyponatremia and hyperkalemia. The patient was accompanied by her son and nephew.  As per them she was not feeling well initially complaining of body pain/abdominal pain for about a week.  Also some concern about not able to pass stool and less urine output. In the ER, she was afebrile, BP elevated, in room air.  The labs showed sodium 109, potassium 6, BUN 38, creatinine 1.13, albumin 2.7, hemoglobin 5.7. She received insulin , dextrose  and Lokelma  in ER.  As per family member her mentation is around her baseline.  No vomiting or any neurological changes.  Ordered blood transfusion and patient is being admitted. Her home medication includes amlodipine , furosemide , losartan , HCTZ.  She is being admitted to ICU. I spoke with ER nurse to send urine studies before giving her Lasix .  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 Verlin and Dr Lucas.   Severe aortic stenosis    Superficial vein thrombosis 05/21/2021   R arm   Vitamin D deficiency     Past Surgical History:  Procedure Laterality Date   EYE SURGERY Left 2022   cataract   INTRAOPERATIVE TRANSTHORACIC ECHOCARDIOGRAM N/A 08/13/2021   Procedure: INTRAOPERATIVE  TRANSTHORACIC ECHOCARDIOGRAM;  Surgeon: Verlin Lonni BIRCH, MD;  [facility];  Service: Open Heart Surgery;  Laterality: N/A;   KNEE SURGERY Left    RIGHT/LEFT HEART CATH AND CORONARY ANGIOGRAPHY N/A 05/17/2021   Procedure: RIGHT/LEFT HEART CATH AND CORONARY ANGIOGRAPHY;  Surgeon: Verlin Lonni BIRCH, MD;  Location: [facility];  Service: Cardiovascular;  Laterality: N/A;   TRANSCATHETER AORTIC VALVE REPLACEMENT, TRANSFEMORAL N/A 08/13/2021   Procedure: TRANSCATHETER AORTIC VALVE REPLACEMENT, TRANSFEMORAL;  Surgeon: Verlin Lonni BIRCH, MD;  Location: [facility];  Service: Open Heart Surgery;  Laterality: N/A;    Family Hx:  Family History  Problem Relation Age of Onset   Hypertension Mother     Social History:  reports that she has an unknown smoking status. She has never used smokeless tobacco. She reports that she does not drink alcohol and does not use drugs.  Allergies:  Allergies  Allergen Reactions   Tylenol  [Acetaminophen ]     Pt reports long time ago she got sick on tylenol  she not sure what happen but takes Tylenol  now.    Medications: Prior to Admission medications   Medication Sig Start Date End Date Taking? Authorizing Provider  amLODipine  (NORVASC ) 10 MG tablet Take 10 mg by mouth daily.  03/18/17   [provider]  amoxicillin  (AMOXIL ) 500 MG tablet Take 4 tablets by mouth 1 hour prior to dental procedures and cleanings 10/11/21   Sebastian Lamarr SAUNDERS, PA-C  aspirin  EC 81 MG tablet Take 81 mg by mouth daily.    [provider]  Calcium Carbonate-Vit D-Min (GNP CALCIUM 600 +D/MINERALS) 600-800 MG-UNIT TABS Take 1 tablet by mouth daily.    [provider]  colchicine  0.6 MG tablet Take 1 tablet (0.6 mg total) by mouth daily. 08/16/21 10/11/21  Rayford Hedge, MD  diphenhydrAMINE  (BENADRYL ) 25 MG tablet Take 25 mg by mouth every 8 (eight) hours as needed.    [provider]  ferrous sulfate  325 (65  FE) MG tablet Take 325 mg by mouth daily.    [provider]  furosemide  (LASIX ) 20 MG tablet Take 20 mg by mouth.    [provider]  glipiZIDE (GLUCOTROL) 5 MG tablet Take 10 mg by mouth 2 (two) times daily. 10/16/21   [provider]  Iron-Vitamin C (VITRON-C PO) Take 1 tablet by mouth daily in the afternoon.    [provider]  linagliptin (TRADJENTA) 5 MG TABS tablet Take 5 mg by mouth daily.    [provider]  losartan  (COZAAR ) 25 MG tablet Take 50 mg by mouth daily.    [provider]  losartan -hydrochlorothiazide (HYZAAR) 100-12.5 MG tablet Take 1 tablet by mouth daily. 05/06/17   [provider]  Menthol , Topical Analgesic, (BIOFREEZE EX) Apply 1 application  topically 4 (four) times daily as needed (joint/muscle pain). 3.5 % Gel    [provider]  Menthol -Methyl Salicylate  (SALONPAS  PAIN RELIEF PATCH EX) Place 1 patch onto the skin every 8 (eight) hours as needed (pain).    [provider]  Multiple Vitamins-Minerals (PRESERVISION AREDS 2 PO) Take 1 tablet by mouth in the morning and at bedtime.    [provider]  pantoprazole  (PROTONIX ) 40 MG tablet Take 1 tablet (40 mg total) by mouth daily. 09/13/21   Fernand Prost, MD  Polyethyl Glyc-Propyl Glyc PF (SYSTANE HYDRATION PF) 0.4-0.3 % SOLN Place 1 drop into both eyes 4 (four) times daily as needed (dry eyes).    [provider]  pravastatin  (PRAVACHOL ) 80 MG tablet Take 80 mg by mouth daily.    [provider]  prednisoLONE  acetate (PRED FORTE ) 1 % ophthalmic suspension Place 1 drop into the left eye 4 (four) times daily.    [provider]  psyllium (METAMUCIL) 0.52 g capsule Take 1 capsule (0.52 g total) by mouth at bedtime. 08/21/21   Nahser, Aleene PARAS, MD  senna-docusate (SENOKOT-S) 8.6-50 MG tablet Take 2 tablets by mouth at bedtime.    [provider]  Skin Protectants, Misc. (EUCERIN) cream Apply 1 application   topically in the morning and at bedtime.    [provider]  sodium bicarbonate 650 MG tablet Take 650 mg by mouth 2 (two) times daily. 10/18/21   [provider]  tolnaftate (TINACTIN) 1 % spray Apply 1 spray topically daily. Applied to feet    [provider]    I have reviewed the patient's current medications.  Labs: Renal Panel: Recent Labs  Lab 03/12/24 1346  NA 109*  K 6.0*  CL 74*  CO2 20*  GLUCOSE 205*  BUN 38*  CREATININE 1.13*  CALCIUM 8.1*     CBC:    Latest Ref Rng & Units 03/12/2024    1:46 PM 10/30/2021    4:15 PM 10/30/2021   10:30 AM  CBC  WBC 4.0 - 10.5 K/uL 13.3  9.5  9.3   Hemoglobin 12.0 - 15.0 g/dL 5.7  8.8  7.3   Hematocrit 36.0 - 46.0 % 18.0  27.7  23.8   Platelets 150 -  400 K/uL 366  444  479      Anemia Panel:  Recent Labs    03/12/24 1346  HGB 5.7*  MCV 67.7*    Recent Labs  Lab 03/12/24 1346  AST 33  ALT 31  ALKPHOS 115  BILITOT 0.5  PROT 7.5  ALBUMIN 2.7*    No results found for: HGBA1C  ROS:  Pertinent items noted in HPI and remainder of comprehensive ROS otherwise negative.  Physical Exam: Vitals:   03/12/24 1319 03/12/24 1400  BP: (!) 170/57 (!) 163/42  Pulse: 72 71  Resp: (!) 21 (!) 24  Temp:    SpO2: 96% 94%     General exam: Appears calm and comfortable  Respiratory system: Clear to auscultation. Respiratory effort normal. No wheezing or crackle Cardiovascular system: S1 & S2 heard, RRR.   Gastrointestinal system: Abdomen is nondistended, soft and nontender. Normal bowel sounds heard. Central nervous system: Alert awake, non-focal. Extremities: b/l legs pitting edema up to thigh++ Skin: No rashes, lesions or ulcers Psychiatry:alert, awake  Assessment/Plan:  # Severe acute on chronic hyponatremia: Patient with history of hyponatremia in the past and noted HCTZ on her med list.  Examination consistent with fluid overload and fortunately mental status around her baseline.  I  discussed with ER nurse to send urine specimen for urine sodium and urine osmolality immediately.  Order a dose of Lasix  40 mg IV to be given only after sending urine specimen and repeat lab. Recommend to admit to ICU, discussed with the EDP and later with ICU providers. Check serum sodium level every 2-3 hours, goal sodium level not more than 115 by tomorrow afternoon.  Given stable mental status and fluid overload, no need for hypertonic saline at this time. Order frequent neuro-check. Continue to monitor closely.  Check TSH and a.m. cortisol level.  # Hyperkalemia: Noted ARB listed as home medication.  Received dextrose , insulin  and Lokelma  in ER.  Diuretics withheld.  Repeat lab.  # CKD 3B: Creatinine level around baseline.  Bladder scan.  # Severe anemia, GI is following for possible bleeding.  Blood transfusion ordered.  Thank you for the consult.  Will continue to follow.  Annalise Mcdiarmid Amelie Romney 03/12/2024, 3:26 PM  [facility].

## 2024-03-12 NOTE — ED Notes (Signed)
 Pt states she has a burning sensation all over her body with itching that has been going on for a while.

## 2024-03-12 NOTE — H&P (Signed)
 NAMEYazmen Maddox, MRN:  [id number], DOB:  [birth year], LOS: 0 ADMISSION DATE:  03/12/2024, CONSULTATION DATE:  03/12/2024 REFERRING MD:  Dr. Dean - EDP, CHIEF COMPLAINT:  Hyponatremia and GIB    History of Present Illness:  Krystal Maddox is a 83 y.o. female with a past medical history significant for severe aortic stenosis now s/p TAVR, hypertension, CAD, osteoporosis, and PVD who presented to the ED at [facility] with complaints of generalized abdominal pain with decreased appetite that began 1 week prior to admission.  Again, patient was seen tachypneic and slightly hypotensive.  Labs also revealed multiple medical derangements including sodium 109, potassium 6.0, glucose 205, creatinine 1.13 with GFR 48, albumin 2.9, WBC 13.3, hemoglobin 5.7 with positive fecal occult stool.  Given severe metabolic derangements including severe hyponatremia decision was made to admit per PCCM for close monitoring in the ICU.  Pertinent  Medical History  Severe aortic stenosis now s/p TAVR, hypertension, CAD, osteoporosis, and PVD  Significant Hospital Events: Including procedures, antibiotic start and stop dates in addition to other pertinent events   7/5 with complaints of abdominal pain found to go to multiple metabolic derangements including NA 109 admitted to ICU  Interim History / Subjective:  As above   Objective    Blood pressure (!) 163/42, pulse 71, temperature 97.7 F (36.5 C), resp. rate (!) 24, height 5' 1 (1.549 m), SpO2 94%.       No intake or output data in the 24 hours ending 03/12/24 1546 There were no vitals filed for this visit.  Examination: General: Acute on chronically ill appearing elderly female lying in, in NAD HEENT: [place]/AT, MM pink/moist, PERRL,  Neuro: Language barrier but appear alert and oriented x3 CV: s1s2 regular rate and rhythm, no murmur, rubs, or gallops,  PULM:  Clear to auscultation, no increased work of breathing, no added breath sounds  GI: soft, bowel  sounds active in all 4 quadrants, non-tender, non-distended Extremities: warm/dry, no edema  Skin: no rashes or lesions  Resolved problem list   Assessment and Plan  Acute Hyponatremia -Na 109 on presentation, clinically she has bilateral pitting edema but her oral mucosa appears dry with reported decreased oral intake over the last couple days   -Goal to raise Na levels 4 to 6 ,Eq/L in the first 24-hrs  P: Nephrology following Trend bmet  Hold hypertonic saline for now   Avoid over correction  Urine Na and osms pending consider dose of lasix  pending urine studies    Acute blood loss anemia superimposed on IDA with concern for underling GI bleed  -Positive fecal occult stool on admission  P: S/P one unit PRBC in ED  GI following, medically optimizing prior to scope  Trend CBC  Identify and treat course  NPO  Maintain 2 large bore PIVs PPI   CKD stage 3b -Baseline creatinine appears to range in the mid 40's Hyperkalemia  P: Follow renal function  Monitor urine output Trend Bmet Avoid nephrotoxins Ensure adequate renal perfusion   History of severe aortic stenosis now S/P TAVR P: Continuous telemetry  Optimize electrolytes   Hyperglycemia  P: SSI  CBG goal 140-180 CBG checks q4  Hypoalbuminemia  P: Once able to utilize GI track optimize nutrition    Best Practice (right click and Reselect all SmartList Selections daily)   Diet/type: NPO DVT prophylaxis SCD Pressure ulcer(s): N/A GI prophylaxis: PPI Lines: N/A Foley:  N/A Code Status:  full code Last date of multidisciplinary goals of  care discussion: Update patient and family daily   Labs   CBC: Recent Labs  Lab 03/12/24 1346  WBC 13.3*  NEUTROABS 10.7*  HGB 5.7*  HCT 18.0*  MCV 67.7*  PLT 366    Basic Metabolic Panel: Recent Labs  Lab 03/12/24 1346  NA 109*  K 6.0*  CL 74*  CO2 20*  GLUCOSE 205*  BUN 38*  CREATININE 1.13*  CALCIUM 8.1*   GFR: CrCl cannot be calculated  (Unknown ideal weight.). Recent Labs  Lab 03/12/24 1346  WBC 13.3*    Liver Function Tests: Recent Labs  Lab 03/12/24 1346  AST 33  ALT 31  ALKPHOS 115  BILITOT 0.5  PROT 7.5  ALBUMIN 2.7*   Recent Labs  Lab 03/12/24 1346  LIPASE 50   No results for input(s): AMMONIA in the last 168 hours.  ABG    Component Value Date/Time   PHART 7.428 08/12/2021 1236   PCO2ART 39.3 08/12/2021 1236   PO2ART 96.7 08/12/2021 1236   HCO3 25.5 08/12/2021 1236   TCO2 26 08/13/2021 1150   ACIDBASEDEF 1.0 05/17/2021 1335   O2SAT 97.8 08/12/2021 1236     Coagulation Profile: No results for input(s): INR, PROTIME in the last 168 hours.  Cardiac Enzymes: No results for input(s): CKTOTAL, CKMB, CKMBINDEX, TROPONINI in the last 168 hours.  HbA1C: No results found for: HGBA1C  CBG: No results for input(s): GLUCAP in the last 168 hours.  Review of Systems:   Please see the history of present illness. All other systems reviewed and are negative   Past Medical History:  She,  has a past medical history of CKD (chronic kidney disease), stage III (HCC), Hypertension, Onychomycosis, Osteoporosis, Peripheral arterial disease (HCC), Primary osteoarthritis of right knee (05/26/2017), S/P TAVR (transcatheter aortic valve replacement) (08/13/2021), Severe aortic stenosis, Superficial vein thrombosis (05/21/2021), and Vitamin D deficiency.   Surgical History:   Past Surgical History:  Procedure Laterality Date   EYE SURGERY Left 2022   cataract   INTRAOPERATIVE TRANSTHORACIC ECHOCARDIOGRAM N/A 08/13/2021   Procedure: INTRAOPERATIVE TRANSTHORACIC ECHOCARDIOGRAM;  Surgeon: Verlin Lonni BIRCH, MD;  Location: [facility] INVASIVE CV LAB;  Service: Open Heart Surgery;  Laterality: N/A;   KNEE SURGERY Left    RIGHT/LEFT HEART CATH AND CORONARY ANGIOGRAPHY N/A 05/17/2021   Procedure: RIGHT/LEFT HEART CATH AND CORONARY ANGIOGRAPHY;  Surgeon: Verlin Lonni BIRCH, MD;  Location: [facility]  INVASIVE CV LAB;  Service: Cardiovascular;  Laterality: N/A;   TRANSCATHETER AORTIC VALVE REPLACEMENT, TRANSFEMORAL N/A 08/13/2021   Procedure: TRANSCATHETER AORTIC VALVE REPLACEMENT, TRANSFEMORAL;  Surgeon: Verlin Lonni BIRCH, MD;  Location: [facility] INVASIVE CV LAB;  Service: Open Heart Surgery;  Laterality: N/A;     Social History:   reports that she has an unknown smoking status. She has never used smokeless tobacco. She reports that she does not drink alcohol and does not use drugs.   Family History:  Her family history includes Hypertension in her mother.   Allergies Allergies  Allergen Reactions   Tylenol  [Acetaminophen ]     Pt reports long time ago she got sick on tylenol  she not sure what happen but takes Tylenol  now.     Home Medications  Prior to Admission medications   Medication Sig Start Date End Date Taking? Authorizing Provider  amLODipine  (NORVASC ) 10 MG tablet Take 10 mg by mouth daily.  03/18/17   [provider]  amoxicillin  (AMOXIL ) 500 MG tablet Take 4 tablets by mouth 1 hour prior to dental procedures and cleanings 10/11/21  Sebastian Lamarr SAUNDERS, PA-C  aspirin  EC 81 MG tablet Take 81 mg by mouth daily.    [provider]  Calcium Carbonate-Vit D-Min (GNP CALCIUM 600 +D/MINERALS) 600-800 MG-UNIT TABS Take 1 tablet by mouth daily.    [provider]  colchicine  0.6 MG tablet Take 1 tablet (0.6 mg total) by mouth daily. 08/16/21 10/11/21  Rayford Hedge, MD  diphenhydrAMINE  (BENADRYL ) 25 MG tablet Take 25 mg by mouth every 8 (eight) hours as needed.    [provider]  ferrous sulfate  325 (65 FE) MG tablet Take 325 mg by mouth daily.    [provider]  furosemide  (LASIX ) 20 MG tablet Take 20 mg by mouth.    [provider]  glipiZIDE (GLUCOTROL) 5 MG tablet Take 10 mg by mouth 2 (two) times daily. 10/16/21   [provider]  Iron-Vitamin C (VITRON-C PO) Take 1 tablet by mouth daily in the afternoon.    [provider]  linagliptin (TRADJENTA) 5 MG TABS tablet Take 5 mg by mouth daily.    [provider]  losartan  (COZAAR ) 25 MG tablet Take 50 mg by mouth daily.    [provider]  losartan -hydrochlorothiazide (HYZAAR) 100-12.5 MG tablet Take 1 tablet by mouth daily. 05/06/17   [provider]  Menthol , Topical Analgesic, (BIOFREEZE EX) Apply 1 application  topically 4 (four) times daily as needed (joint/muscle pain). 3.5 % Gel    [provider]  Menthol -Methyl Salicylate  (SALONPAS  PAIN RELIEF PATCH EX) Place 1 patch onto the skin every 8 (eight) hours as needed (pain).    [provider]  Multiple Vitamins-Minerals (PRESERVISION AREDS 2 PO) Take 1 tablet by mouth in the morning and at bedtime.    [provider]  pantoprazole  (PROTONIX ) 40 MG tablet Take 1 tablet (40 mg total) by mouth daily. 09/13/21   Fernand Prost, MD  Polyethyl Glyc-Propyl Glyc PF (SYSTANE HYDRATION PF) 0.4-0.3 % SOLN Place 1 drop into both eyes 4 (four) times daily as needed (dry eyes).    [provider]  pravastatin  (PRAVACHOL ) 80 MG tablet Take 80 mg by mouth daily.    [provider]  prednisoLONE  acetate (PRED FORTE ) 1 % ophthalmic suspension Place 1 drop into the left eye 4 (four) times daily.    [provider]  psyllium (METAMUCIL) 0.52 g capsule Take 1 capsule (0.52 g total) by mouth at bedtime. 08/21/21   Nahser, Aleene PARAS, MD  senna-docusate (SENOKOT-S) 8.6-50 MG tablet Take 2 tablets by mouth at bedtime.    [provider]  Skin Protectants, Misc. (EUCERIN) cream Apply 1 application  topically in the morning and at bedtime.    [provider]  sodium bicarbonate 650 MG tablet Take 650 mg by mouth 2 (two) times daily. 10/18/21   [provider]  tolnaftate (TINACTIN) 1 % spray Apply 1 spray topically daily. Applied to feet    [provider]     Critical care time:   CRITICAL CARE Performed by: Veleda Mun  D. Harris   Total critical care time: 40 minutes  Critical care time was exclusive of separately billable procedures and treating other patients.  Critical care was necessary to treat or prevent imminent or life-threatening deterioration.  Critical care was time spent personally by me on the following activities: development of treatment plan with patient and/or surrogate as well as nursing, discussions with consultants, evaluation of patient's response to treatment, examination of patient, obtaining history from patient or surrogate, ordering and  performing treatments and interventions, ordering and review of laboratory studies, ordering and review of radiographic studies, pulse oximetry and re-evaluation of patient's condition.  Hjalmar Ballengee D. Harris, NP-C  Pulmonary & Critical Care Personal contact information can be found on Amion  If no contact or response made please call [phone] 03/12/2024, 4:52 PM

## 2024-03-12 NOTE — Progress Notes (Addendum)
 eLink Physician-Brief Progress Note Patient Name: Krystal Maddox DOB: 1941/05/27 MRN: [id number]   Date of Service  03/12/2024  HPI/Events of Note  Repeat serum sodium is 110.   RN reports blood pressure of about 170/60.  Requesting as needed for hypertension.  eICU Interventions  Nephrology is starting patient on 3% saline. Will monitor blood pressure at 170/164 while before committing to antihypertensive medications. Discussed with RN.     Intervention Category Intermediate Interventions: Other: (Follow-up/hypertension)  [name] 03/12/2024, 9:43 PM  Addendum: Patient requesting Tylenol  and menthol  cream which she uses at home for leg pain.  She is also requesting something to eat. Home medications for pain reordered.  Full liquid diet ordered.  Discussed with RN.  [name] 03/12/2024, 11:17 PM -------------------------------- Follow up: Hemoglobin is down to 6.8 this morning from 7.0.  Will transfuse another unit of packed red cells to keep hemoglobin above 7.

## 2024-03-12 NOTE — ED Notes (Addendum)
 Son at bedside family friend interpreting for pt. Son, Gloai, want to make sure he is included in any medical decisions. He want to make sure if pt stops breathing or in a coma he is able to make the best decision for his mother. RN informed pt EDP and/or admitting MD will respect their wishes and chart decisions in chart.  Mr. Zoe, son, would like to make sure there is a Nurse, learning disability in case he isn't available. Mr. Zoe reassured translator will be available.

## 2024-03-12 NOTE — ED Triage Notes (Signed)
 Patient from home for eval of generalized abdominal pain x 1 week. Family reports very little if any bowel movements or urine production. BLE edema and family reports SOB.

## 2024-03-12 NOTE — ED Provider Notes (Signed)
 [place] EMERGENCY DEPARTMENT AT [facility] Provider Note   CSN: [id number] Arrival date & time: 03/12/24  1308     Patient presents with: Abdominal Pain   Krystal Maddox is a 83 y.o. female.   Pt is a 83 yo female with pmhx significant for HTN, OA, CKD, and AS s/p TAVR.  Pt speaks [place].  Pt requests family interpreting.  Pt has been having abd pain for the past few days.  She has had some nausea.  She has had an appetite.  No fevers.       Prior to Admission medications   Medication Sig Start Date End Date Taking? Authorizing Provider  amLODipine  (NORVASC ) 10 MG tablet Take 10 mg by mouth daily.  03/18/17   [provider]  amoxicillin  (AMOXIL ) 500 MG tablet Take 4 tablets by mouth 1 hour prior to dental procedures and cleanings 10/11/21   Sebastian Lamarr SAUNDERS, PA-C  aspirin  EC 81 MG tablet Take 81 mg by mouth daily.    [provider]  Calcium Carbonate-Vit D-Min (GNP CALCIUM 600 +D/MINERALS) 600-800 MG-UNIT TABS Take 1 tablet by mouth daily.    [provider]  colchicine  0.6 MG tablet Take 1 tablet (0.6 mg total) by mouth daily. 08/16/21 10/11/21  Rayford Hedge, MD  diphenhydrAMINE  (BENADRYL ) 25 MG tablet Take 25 mg by mouth every 8 (eight) hours as needed.    [provider]  ferrous sulfate  325 (65 FE) MG tablet Take 325 mg by mouth daily.    [provider]  furosemide  (LASIX ) 20 MG tablet Take 20 mg by mouth.    [provider]  glipiZIDE (GLUCOTROL) 5 MG tablet Take 10 mg by mouth 2 (two) times daily. 10/16/21   [provider]  Iron-Vitamin C (VITRON-C PO) Take 1 tablet by mouth daily in the afternoon.    [provider]  linagliptin (TRADJENTA) 5 MG TABS tablet Take 5 mg by mouth daily.    [provider]  losartan  (COZAAR ) 25 MG tablet Take 50 mg by mouth daily.    [provider]  losartan -hydrochlorothiazide (HYZAAR) 100-12.5 MG tablet Take 1 tablet by mouth daily. 05/06/17    [provider]  Menthol , Topical Analgesic, (BIOFREEZE EX) Apply 1 application  topically 4 (four) times daily as needed (joint/muscle pain). 3.5 % Gel    [provider]  Menthol -Methyl Salicylate  (SALONPAS  PAIN RELIEF PATCH EX) Place 1 patch onto the skin every 8 (eight) hours as needed (pain).    [provider]  Multiple Vitamins-Minerals (PRESERVISION AREDS 2 PO) Take 1 tablet by mouth in the morning and at bedtime.    [provider]  pantoprazole  (PROTONIX ) 40 MG tablet Take 1 tablet (40 mg total) by mouth daily. 09/13/21   Fernand Prost, MD  Polyethyl Glyc-Propyl Glyc PF (SYSTANE HYDRATION PF) 0.4-0.3 % SOLN Place 1 drop into both eyes 4 (four) times daily as needed (dry eyes).    [provider]  pravastatin  (PRAVACHOL ) 80 MG tablet Take 80 mg by mouth daily.    [provider]  prednisoLONE  acetate (PRED FORTE ) 1 % ophthalmic suspension Place 1 drop into the left eye 4 (four) times daily.    [provider]  psyllium (METAMUCIL) 0.52 g capsule Take 1 capsule (0.52 g total) by mouth at bedtime. 08/21/21   Nahser, Aleene PARAS, MD  senna-docusate (SENOKOT-S) 8.6-50 MG tablet Take 2 tablets by mouth at bedtime.    [provider]  Skin Protectants, Misc. (EUCERIN)  cream Apply 1 application  topically in the morning and at bedtime.    [provider]  sodium bicarbonate 650 MG tablet Take 650 mg by mouth 2 (two) times daily. 10/18/21   [provider]  tolnaftate (TINACTIN) 1 % spray Apply 1 spray topically daily. Applied to Dealer, Historical, MD    Allergies: Tylenol  [acetaminophen ]    Review of Systems  Gastrointestinal:  Positive for abdominal pain and nausea.  All other systems reviewed and are negative.   Updated Vital Signs BP (!) 163/42   Pulse 71   Temp 97.7 F (36.5 C)   Resp (!) 24   Ht 5' 1 (1.549 m)   SpO2 94%   BMI 28.87 kg/m   Physical Exam Vitals and nursing note  reviewed.  Constitutional:      Appearance: She is well-developed. She is obese.  HENT:     Head: Normocephalic and atraumatic.     Mouth/Throat:     Mouth: Mucous membranes are moist.     Pharynx: Oropharynx is clear.  Eyes:     Extraocular Movements: Extraocular movements intact.     Pupils: Pupils are equal, round, and reactive to light.  Cardiovascular:     Rate and Rhythm: Normal rate and regular rhythm.     Heart sounds: Murmur heard.  Pulmonary:     Effort: Tachypnea present.     Breath sounds: Rhonchi present.  Abdominal:     General: Abdomen is flat and protuberant. Bowel sounds are normal.     Palpations: Abdomen is soft.     Tenderness: There is abdominal tenderness in the suprapubic area.  Genitourinary:    Rectum: Guaiac result positive.  Musculoskeletal:     Right lower leg: Edema present.     Left lower leg: Edema present.  Skin:    General: Skin is warm.     Capillary Refill: Capillary refill takes less than 2 seconds.  Neurological:     General: No focal deficit present.     Mental Status: She is alert and oriented to person, place, and time.  Psychiatric:        Mood and Affect: Mood normal.        Behavior: Behavior normal.     (all labs ordered are listed, but only abnormal results are displayed) Labs Reviewed  CBC WITH DIFFERENTIAL/PLATELET - Abnormal; Notable for the following components:      Result Value   WBC 13.3 (*)    RBC 2.66 (*)    Hemoglobin 5.7 (*)    HCT 18.0 (*)    MCV 67.7 (*)    MCH 21.4 (*)    RDW 17.3 (*)    Neutro Abs 10.7 (*)    Abs Immature Granulocytes 0.13 (*)    All other components within normal limits  COMPREHENSIVE METABOLIC PANEL WITH GFR - Abnormal; Notable for the following components:   Sodium 109 (*)    Potassium 6.0 (*)    Chloride 74 (*)    CO2 20 (*)    Glucose, Bld 205 (*)    BUN 38 (*)    Creatinine, Ser 1.13 (*)    Calcium 8.1 (*)    Albumin 2.7 (*)    GFR, Estimated 48 (*)    All other components  within normal limits  URINALYSIS, ROUTINE W REFLEX MICROSCOPIC - Abnormal; Notable for the following components:   Protein, ur 100 (*)    Bacteria, UA RARE (*)  All other components within normal limits  POC OCCULT BLOOD, ED - Abnormal; Notable for the following components:   Fecal Occult Bld POSITIVE (*)    All other components within normal limits  LIPASE, BLOOD  PROTIME-INR  VITAMIN B12  FOLATE  IRON AND TIBC  FERRITIN  RETICULOCYTES  BRAIN NATRIURETIC PEPTIDE  OSMOLALITY, URINE  SODIUM, URINE, RANDOM  PROTEIN / CREATININE RATIO, URINE  OSMOLALITY  TSH  TYPE AND SCREEN  PREPARE RBC (CROSSMATCH)  TROPONIN I (HIGH SENSITIVITY)  TROPONIN I (HIGH SENSITIVITY)    EKG: EKG Interpretation Date/Time:  Saturday March 12 2024 13:21:06 EDT Ventricular Rate:  72 PR Interval:  251 QRS Duration:  101 QT Interval:  426 QTC Calculation: 467 R Axis:   66  Text Interpretation: Sinus or ectopic atrial rhythm Prolonged PR interval Low voltage, precordial leads No significant change since last tracing Confirmed by Dean Clarity [phone]) on 03/12/2024 2:37:44 PM  Radiology: DG Chest Portable 1 View Result Date: 03/12/2024 CLINICAL DATA:  Shortness of breath EXAM: PORTABLE CHEST 1 VIEW COMPARISON:  08/16/2021 FINDINGS: Cardiomegaly, vascular congestion. Right basilar opacity. No confluent opacity on the left. No overt edema or effusions. No acute bony abnormality. Aortic atherosclerosis. IMPRESSION: Cardiomegaly, vascular congestion. Right basilar atelectasis or infiltrate. Electronically Signed   By: Franky Crease M.D.   On: 03/12/2024 15:02     Procedures   Medications Ordered in the ED  furosemide  (LASIX ) injection 40 mg (has no administration in time range)  0.9 %  sodium chloride  infusion (Manually program via Guardrails IV Fluids) (has no administration in time range)  pantoprazole  (PROTONIX ) injection 40 mg (has no administration in time range)  insulin  aspart (novoLOG ) injection  5 Units (has no administration in time range)    And  dextrose  50 % solution 50 mL (has no administration in time range)  sodium zirconium cyclosilicate  (LOKELMA ) packet 10 g (has no administration in time range)  ondansetron  (ZOFRAN ) injection 4 mg (4 mg Intravenous Given 03/12/24 1350)                                    Medical Decision Making Amount and/or Complexity of Data Reviewed Labs: ordered. Radiology: ordered.  Risk OTC drugs. Prescription drug management. Decision regarding hospitalization.   This patient presents to the ED for concern of abd pain, this involves an extensive number of treatment options, and is a complaint that carries with it a high risk of complications and morbidity.  The differential diagnosis includes uti, constipation, intraabdominal infection   Co morbidities that complicate the patient evaluation  HTN, OA, CKD, and AS s/p TAVR   Additional history obtained:  Additional history obtained from epic chart review External records from outside source obtained and reviewed including EMS report/family   Lab Tests:  I Ordered, and personally interpreted labs.  The pertinent results include:  cbc with hgb 13.3, hgb low at 5.7 (hgb 8.8 in 2023); cmp with na low at 109, k elevated at 6, glucose elevated at 205, bun 38 and cr 1.13; trop 12   Imaging Studies ordered:  I ordered imaging studies including cxr  I independently visualized and interpreted imaging which showed  Cardiomegaly, vascular congestion.    Right basilar atelectasis or infiltrate.   I agree with the radiologist interpretation   Cardiac Monitoring:  The patient was maintained on a cardiac monitor.  I personally viewed and interpreted the cardiac monitored which  showed an underlying rhythm of: nsr   Medicines ordered and prescription drug management:  I ordered medication including zofran   for nausea  Reevaluation of the patient after these medicines showed that the  patient improved I have reviewed the patients home medicines and have made adjustments as needed   Test Considered:  ct   Critical Interventions:  transfusion   Consultations Obtained:  I requested consultation with the nephrologist (Dr. Dolan),  and discussed lab and imaging findings as well as pertinent plan -he will consult.  He does request ICU. Pt d/w [facility] (Amy Esterwood) who will see pt in consult Pt d/w CCM for admission   Problem List / ED Course:  Hyponatremia:  hx hyponatremia.  Pt is very fluid overloaded today.  IV lasix  given.  Nephrology consulted. Symptomatic anemia:  hx of iron deficiency anemia.  Last blood transfusion 2023.  Pt does consent for a transfusion today.  Guaiac +, so GI consulted.  She has had some black stools and so protonix  given. CHF:  hx fluid overload.  She is followed by Dr. Alveta who saw her in May.  Last ECHO 1/24 which showed an EF of 60-65%   Reevaluation:  After the interventions noted above, I reevaluated the patient and found that they have :improved   Social Determinants of Health:  Lives at home.  [place].   Dispostion:  After consideration of the diagnostic results and the patients response to treatment, I feel that the patent would benefit from admission.  CRITICAL CARE Performed by: Mliss Boyers   Total critical care time: 30 minutes  Critical care time was exclusive of separately billable procedures and treating other patients.  Critical care was necessary to treat or prevent imminent or life-threatening deterioration.  Critical care was time spent personally by me on the following activities: development of treatment plan with patient and/or surrogate as well as nursing, discussions with consultants, evaluation of patient's response to treatment, examination of patient, obtaining history from patient or surrogate, ordering and performing treatments and interventions, ordering and review of laboratory  studies, ordering and review of radiographic studies, pulse oximetry and re-evaluation of patient's condition.        Final diagnoses:  Acute on chronic congestive heart failure, unspecified heart failure type (HCC)  Hyponatremia  Symptomatic anemia  Hyperkalemia    ED Discharge Orders     None          Boyers Mliss, MD 03/12/24 1534

## 2024-03-12 NOTE — Consult Note (Signed)
 Consultation  Referring Provider: ER MD [facility] Primary Care Physician:  Cloria Annabella CROME, DO Primary Gastroenterologist: [place]  Reason for Consultation: Severe microcytic anemia, heme positive stool  HPI: Krystal Maddox is an 83 yo female non-English-speaking [place] who was brought to the emergency room with complaints of abdominal pain and sickness, apparently not feeling well over the past couple of days.  Her son accompanies her and his friend is doing the interpretation at their request.  Patient does have history of hypertension, chronic kidney disease, peripheral arterial disease, aortic stenosis status post TAVR December 2022.  Also per cardiology notes history of pulmonary hypertension. [facility] is followed [facility]. History is very difficult even with the friend interpreting.  Apparently has not had an appetite over the past few days and complains of nausea but has not been vomiting.  They are unaware where in her abdomen she has been hurting but she points to her right lower quadrant.  No melena or hematochezia, no complaints of diarrhea.  No fever or chills. I am not certain that she has been taking all of the medicines on her list. She complains of itching.  Son does not believe that she has had any GI evaluation in the past  Workup in the emergency room with chest x-ray consistent with cardiomegaly and vascular congestion  Noted to be tachypneic, and hypertensive with blood pressure 163/42. Heme positive per ER MD.  Labs show sodium 109/potassium 6/glucose 205/BUN 13/creatinine 1.13 LFTs within normal limits INR pending WBC 13.3/hemoglobin 5.7/hematocrit 18/MCV 67.7/platelets 366 Initial troponin normal, BNP pending Iron studies pending  Review of previous labs show that she has been anemic at least over the past couple of years with hemoglobins in the 7.3-9 range.  I do not see previous iron studies but she does have an oral iron supplement on her  med list.  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 Verlin and Dr Lucas.   Severe aortic stenosis    Superficial vein thrombosis 05/21/2021   R arm   Vitamin D deficiency     Past Surgical History:  Procedure Laterality Date   EYE SURGERY Left 2022   cataract   INTRAOPERATIVE TRANSTHORACIC ECHOCARDIOGRAM N/A 08/13/2021   Procedure: INTRAOPERATIVE TRANSTHORACIC ECHOCARDIOGRAM;  Surgeon: Verlin Lonni BIRCH, MD;  Location: [facility];  Service: Open Heart Surgery;  Laterality: N/A;   KNEE SURGERY Left    RIGHT/LEFT HEART CATH AND CORONARY ANGIOGRAPHY N/A 05/17/2021   Procedure: RIGHT/LEFT HEART CATH AND CORONARY ANGIOGRAPHY;  Surgeon: Verlin Lonni BIRCH, MD;  Location: [facility];  Service: Cardiovascular;  Laterality: N/A;   TRANSCATHETER AORTIC VALVE REPLACEMENT, TRANSFEMORAL N/A 08/13/2021   Procedure: TRANSCATHETER AORTIC VALVE REPLACEMENT, TRANSFEMORAL;  Surgeon: Verlin Lonni BIRCH, MD;  Location: [facility];  Service: Open Heart Surgery;  Laterality: N/A;    Prior to Admission medications   Medication Sig Start Date End Date Taking? Authorizing Provider  amLODipine  (NORVASC ) 10 MG tablet Take 10  mg by mouth daily.  03/18/17   [provider]  amoxicillin  (AMOXIL ) 500 MG tablet Take 4 tablets by mouth 1 hour prior to dental procedures and cleanings 10/11/21   Sebastian Lamarr SAUNDERS, PA-C  aspirin  EC 81 MG tablet Take 81 mg by mouth daily.    [provider]  Calcium Carbonate-Vit D-Min (GNP  CALCIUM 600 +D/MINERALS) 600-800 MG-UNIT TABS Take 1 tablet by mouth daily.    [provider]  colchicine  0.6 MG tablet Take 1 tablet (0.6 mg total) by mouth daily. 08/16/21 10/11/21  Rayford Hedge, MD  diphenhydrAMINE  (BENADRYL ) 25 MG tablet Take 25 mg by mouth every 8 (eight) hours as needed.    [provider]  ferrous sulfate  325 (65 FE) MG tablet Take 325 mg by mouth daily.    [provider]  furosemide  (LASIX ) 20 MG tablet Take 20 mg by mouth.    [provider]  glipiZIDE (GLUCOTROL) 5 MG tablet Take 10 mg by mouth 2 (two) times daily. 10/16/21   [provider]  Iron-Vitamin C (VITRON-C PO) Take 1 tablet by mouth daily in the afternoon.    [provider]  linagliptin (TRADJENTA) 5 MG TABS tablet Take 5 mg by mouth daily.    [provider]  losartan  (COZAAR ) 25 MG tablet Take 50 mg by mouth daily.    [provider]  losartan -hydrochlorothiazide (HYZAAR) 100-12.5 MG tablet Take 1 tablet by mouth daily. 05/06/17   [provider]  Menthol , Topical Analgesic, (BIOFREEZE EX) Apply 1 application  topically 4 (four) times daily as needed (joint/muscle pain). 3.5 % Gel    [provider]  Menthol -Methyl Salicylate  (SALONPAS  PAIN RELIEF PATCH EX) Place 1 patch onto the skin every 8 (eight) hours as needed (pain).    [provider]  Multiple Vitamins-Minerals (PRESERVISION AREDS 2 PO) Take 1 tablet by mouth in the morning and at bedtime.    [provider]  pantoprazole  (PROTONIX ) 40 MG tablet Take 1 tablet (40 mg total) by mouth daily. 09/13/21   Fernand Prost, MD  Polyethyl Glyc-Propyl Glyc PF (SYSTANE HYDRATION PF) 0.4-0.3 % SOLN Place 1 drop into both eyes 4 (four) times daily as needed (dry eyes).    [provider]  pravastatin  (PRAVACHOL ) 80 MG tablet Take 80 mg by mouth daily.    [provider]  prednisoLONE  acetate (PRED FORTE ) 1 % ophthalmic suspension Place 1 drop  into the left eye 4 (four) times daily.    [provider]  psyllium (METAMUCIL) 0.52 g capsule Take 1 capsule (0.52 g total) by mouth at bedtime. 08/21/21   Nahser, Aleene PARAS, MD  senna-docusate (SENOKOT-S) 8.6-50 MG tablet Take 2 tablets by mouth at bedtime.    [provider]  Skin Protectants, Misc. (EUCERIN) cream Apply 1 application  topically in the morning and at bedtime.    [provider]  sodium bicarbonate 650 MG tablet Take 650 mg by mouth 2 (two) times daily. 10/18/21   [provider]  tolnaftate (TINACTIN) 1 % spray Apply 1 spray topically daily. Applied to Dealer, Historical, MD    No current facility-administered medications for this encounter.   Current Outpatient Medications  Medication Sig Dispense Refill   amLODipine  (NORVASC ) 10 MG tablet Take 10 mg by mouth daily.   0   amoxicillin  (AMOXIL ) 500 MG tablet Take 4 tablets by mouth 1 hour prior to dental procedures and cleanings 12 tablet 6   aspirin  EC  81 MG tablet Take 81 mg by mouth daily.     Calcium Carbonate-Vit D-Min (GNP CALCIUM 600 +D/MINERALS) 600-800 MG-UNIT TABS Take 1 tablet by mouth daily.     colchicine  0.6 MG tablet Take 1 tablet (0.6 mg total) by mouth daily. 30 tablet 0   diphenhydrAMINE  (BENADRYL ) 25 MG tablet Take 25 mg by mouth every 8 (eight) hours as needed.     ferrous sulfate  325 (65 FE) MG tablet Take 325 mg by mouth daily.     furosemide  (LASIX ) 20 MG tablet Take 20 mg by mouth.     glipiZIDE (GLUCOTROL) 5 MG tablet Take 10 mg by mouth 2 (two) times daily.     Iron-Vitamin C (VITRON-C PO) Take 1 tablet by mouth daily in the afternoon.     linagliptin (TRADJENTA) 5 MG TABS tablet Take 5 mg by mouth daily.     losartan  (COZAAR ) 25 MG tablet Take 50 mg by mouth daily.     losartan -hydrochlorothiazide (HYZAAR) 100-12.5 MG tablet Take 1 tablet by mouth daily.  0   Menthol , Topical Analgesic, (BIOFREEZE EX) Apply 1 application  topically 4 (four) times daily  as needed (joint/muscle pain). 3.5 % Gel     Menthol -Methyl Salicylate  (SALONPAS  PAIN RELIEF PATCH EX) Place 1 patch onto the skin every 8 (eight) hours as needed (pain).     Multiple Vitamins-Minerals (PRESERVISION AREDS 2 PO) Take 1 tablet by mouth in the morning and at bedtime.     pantoprazole  (PROTONIX ) 40 MG tablet Take 1 tablet (40 mg total) by mouth daily. 30 tablet 0   Polyethyl Glyc-Propyl Glyc PF (SYSTANE HYDRATION PF) 0.4-0.3 % SOLN Place 1 drop into both eyes 4 (four) times daily as needed (dry eyes).     pravastatin  (PRAVACHOL ) 80 MG tablet Take 80 mg by mouth daily.     prednisoLONE  acetate (PRED FORTE ) 1 % ophthalmic suspension Place 1 drop into the left eye 4 (four) times daily.     psyllium (METAMUCIL) 0.52 g capsule Take 1 capsule (0.52 g total) by mouth at bedtime. 30 capsule 0   senna-docusate (SENOKOT-S) 8.6-50 MG tablet Take 2 tablets by mouth at bedtime.     Skin Protectants, Misc. (EUCERIN) cream Apply 1 application  topically in the morning and at bedtime.     sodium bicarbonate 650 MG tablet Take 650 mg by mouth 2 (two) times daily.     tolnaftate (TINACTIN) 1 % spray Apply 1 spray topically daily. Applied to feet      Allergies as of 03/12/2024 - Reviewed 03/12/2024  Allergen Reaction Noted   Tylenol  [acetaminophen ]  03/07/2016    Family History  Problem Relation Age of Onset   Hypertension Mother     Social History   Socioeconomic History   Marital status: Married    Spouse name: Not on file   Number of children: 5   Years of education: Not on file   Highest education level: Not on file  Occupational History   Not on file  Tobacco Use   Smoking status: Unknown   Smokeless tobacco: Never  Vaping Use   Vaping status: Never Used  Substance and Sexual Activity   Alcohol use: Never   Drug use: Never   Sexual activity: Not on file  Other Topics Concern   Not on file  Social History Narrative   ** Merged History Encounter **       Social Drivers  of Health   Financial Resource Strain: Not on file  Food Insecurity: Not on file  Transportation Needs: Not on file  Physical Activity: Not on file  Stress: Not on file  Social Connections: Not on file  Intimate Partner Violence: Not on file    Review of Systems: Pertinent positive and negative review of systems were noted in the above HPI section.  All other review of systems was otherwise negative.   Physical Exam: Vital signs in last 24 hours: Temp:  [97.7 F (36.5 C)] 97.7 F (36.5 C) (07/05 1314) Pulse Rate:  [71-72] 71 (07/05 1400) Resp:  [21-24] 24 (07/05 1400) BP: (163-170)/(42-57) 163/42 (07/05 1400) SpO2:  [94 %-96 %] 94 % (07/05 1400)   General:   Alert,  Well-developed, well-nourished elderly Asian female, pleasant and cooperative in NAD Head:  Normocephalic and atraumatic. Eyes:  Sclera clear, no icterus.   Conjunctiva pink. Ears:  Normal auditory acuity. Nose:  No deformity, discharge,  or lesions. Mouth:  No deformity or lesions.   Neck:  Supple; no masses or thyromegaly. Lungs:  Clear throughout to auscultation.   No wheezes, crackles, or rhonchi. Heart:  Regular rate and rhythm; no murmurs, clicks, rubs,  or gallops. Abdomen:  Soft,nontender, BS active,nonpalp mass or hsm.   Rectal:  Deferred  Msk:  Symmetrical without gross deformities. . Pulses:  Normal pulses noted. Extremities:  Without clubbing or edema. Neurologic:  Alert and  oriented x4;  grossly normal neurologically. Skin:  Intact without significant lesions or rashes.. Psych:  Alert and cooperative. Normal mood and affect.  Intake/Output from previous day: No intake/output data recorded. Intake/Output this shift: No intake/output data recorded.  Lab Results: Recent Labs    03/12/24 1346  WBC 13.3*  HGB 5.7*  HCT 18.0*  PLT 366   BMET Recent Labs    03/12/24 1346  NA 109*  K 6.0*  CL 74*  CO2 20*  GLUCOSE 205*  BUN 38*  CREATININE 1.13*  CALCIUM 8.1*   LFT Recent Labs     03/12/24 1346  PROT 7.5  ALBUMIN 2.7*  AST 33  ALT 31  ALKPHOS 115  BILITOT 0.5   PT/INR No results for input(s): LABPROT, INR in the last 72 hours. Hepatitis Panel No results for input(s): HEPBSAG, HCVAB, HEPAIGM, HEPBIGM in the last 72 hours.    IMPRESSION:  #68 83 year old non-English-speaking [place] female brought to the emergency room with complaints of abdominal pain, nausea, sickness.  History is minimal even with family friend interpreting  Patient has history of hypertension, chronic kidney disease, and aortic stenosis for which she is status post TAVR December 2022.  Evaluation in the ER today with severe hyponatremia with sodium 109 Chest ray consistent with acute pulmonary edema  #2 severe microcytic anemia with hemoglobin 5.7/MCV 67 and documented Hemoccult positive. Reviewing her chart she has been anemic over the past 3 years generally in the 7.5-9 range..  She has been on oral iron supplement at some point not sure is currently taking.  Patient has not had any previous GI evaluation She does complain of pain in her right lower abdomen today.  Etiology of the severe microcytic anemia is not clear, this has been quite chronic with progression.  Multiple possibilities for chronic slow GI blood loss, will need to rule out occult malignancy, AVMs, chronic gastropathy,  #3 peripheral arterial disease #4 hypertension  PLAN: Patient will need eventual GI workup once her more acute issues have been treated and all parameters much improved. Transfuse slowly to get hemoglobin above 7 Cover with IV  PPI Antiemetic as needed Abdominal imaging with CT with contrast when able, does not need to be today GI will follow with you.    Andrian Sabala PA-C 03/12/2024, 3:51 PM

## 2024-03-12 NOTE — Progress Notes (Signed)
 eLink Physician-Brief Progress Note Patient Name: Krystal Maddox DOB: [birth year] MRN: [id number]   Date of Service  03/12/2024  HPI/Events of Note  83 year old female admitted with acute on chronic hyponatremia and a sodium level of 109.  RN reports labs from 5:43 PM with a sodium of 106 and serum osmolality of 247.  Urine spot sodium was 17 and urine osmolality is 272.  eICU Interventions  Discussed with nephrologist Dr. Dolan.  He tells me that these labs were drawn before patient received furosemide  on account of volume overload.  With her remaining neurologically intact, he recommends a stat renal function panel/serum sodium and if it is low, will start 3% saline. RN informs me that the lab does do 8 PM labs which include a sodium level.  Will await results to plan further action.     Intervention Category Intermediate Interventions: Other:  [name] 03/12/2024, 8:26 PM

## 2024-03-12 NOTE — ED Notes (Signed)
 RN assisted pt on BSC with cane.

## 2024-03-13 DIAGNOSIS — D509 Iron deficiency anemia, unspecified: Secondary | ICD-10-CM | POA: Diagnosis not present

## 2024-03-13 DIAGNOSIS — I5032 Chronic diastolic (congestive) heart failure: Secondary | ICD-10-CM | POA: Diagnosis not present

## 2024-03-13 DIAGNOSIS — E871 Hypo-osmolality and hyponatremia: Secondary | ICD-10-CM | POA: Diagnosis not present

## 2024-03-13 DIAGNOSIS — N1832 Chronic kidney disease, stage 3b: Secondary | ICD-10-CM | POA: Diagnosis not present

## 2024-03-13 LAB — CBC
HCT: 20.9 % — ABNORMAL LOW (ref 36.0–46.0)
Hemoglobin: 6.8 g/dL — CL (ref 12.0–15.0)
MCH: 22.5 pg — ABNORMAL LOW (ref 26.0–34.0)
MCHC: 32.5 g/dL (ref 30.0–36.0)
MCV: 69.2 fL — ABNORMAL LOW (ref 80.0–100.0)
Platelets: 388 K/uL (ref 150–400)
RBC: 3.02 MIL/uL — ABNORMAL LOW (ref 3.87–5.11)
RDW: 18.3 % — ABNORMAL HIGH (ref 11.5–15.5)
WBC: 13 K/uL — ABNORMAL HIGH (ref 4.0–10.5)
nRBC: 0 % (ref 0.0–0.2)

## 2024-03-13 LAB — BASIC METABOLIC PANEL WITH GFR
Anion gap: 11 (ref 5–15)
BUN: 33 mg/dL — ABNORMAL HIGH (ref 8–23)
CO2: 23 mmol/L (ref 22–32)
Calcium: 8.4 mg/dL — ABNORMAL LOW (ref 8.9–10.3)
Chloride: 80 mmol/L — ABNORMAL LOW (ref 98–111)
Creatinine, Ser: 1.18 mg/dL — ABNORMAL HIGH (ref 0.44–1.00)
GFR, Estimated: 46 mL/min — ABNORMAL LOW (ref >60)
Glucose, Bld: 92 mg/dL (ref 70–99)
Potassium: 4.8 mmol/L (ref 3.5–5.1)
Sodium: 114 mmol/L — CL (ref 135–145)

## 2024-03-13 LAB — GLUCOSE, CAPILLARY
Glucose-Capillary: 99 mg/dL (ref 70–99)
Glucose-Capillary: 172 mg/dL — ABNORMAL HIGH (ref 70–99)
Glucose-Capillary: 125 mg/dL — ABNORMAL HIGH (ref 70–99)
Glucose-Capillary: 107 mg/dL — ABNORMAL HIGH (ref 70–99)
Glucose-Capillary: 123 mg/dL — ABNORMAL HIGH (ref 70–99)
Glucose-Capillary: 247 mg/dL — ABNORMAL HIGH (ref 70–99)

## 2024-03-13 LAB — MAGNESIUM: Magnesium: 2.5 mg/dL — ABNORMAL HIGH (ref 1.7–2.4)

## 2024-03-13 LAB — SODIUM
Sodium: 117 mmol/L — CL (ref 135–145)
Sodium: 119 mmol/L — CL (ref 135–145)
Sodium: 122 mmol/L — ABNORMAL LOW (ref 135–145)

## 2024-03-13 LAB — HEMOGLOBIN AND HEMATOCRIT, BLOOD
HCT: 24.3 % — ABNORMAL LOW (ref 36.0–46.0)
Hemoglobin: 8.3 g/dL — ABNORMAL LOW (ref 12.0–15.0)

## 2024-03-13 LAB — PHOSPHORUS: Phosphorus: 5.3 mg/dL — ABNORMAL HIGH (ref 2.5–4.6)

## 2024-03-13 LAB — PREPARE RBC (CROSSMATCH)

## 2024-03-13 MED ORDER — IPRATROPIUM-ALBUTEROL 0.5-2.5 (3) MG/3ML IN SOLN
3.0000 mL | Freq: Four times a day (QID) | RESPIRATORY_TRACT | Status: DC | PRN
  Administered 2024-03-13 – 2024-03-14 (×3): 3 mL via RESPIRATORY_TRACT
  Filled 2024-03-13 (×3): qty 3

## 2024-03-13 MED ORDER — FUROSEMIDE 10 MG/ML IJ SOLN
40.0000 mg | Freq: Once | INTRAMUSCULAR | Status: AC
  Administered 2024-03-13: 40 mg via INTRAVENOUS
  Filled 2024-03-13: qty 4

## 2024-03-13 MED ORDER — OXYCODONE HCL 5 MG PO TABS
2.5000 mg | ORAL_TABLET | ORAL | Status: DC | PRN
  Administered 2024-03-13 – 2024-03-18 (×8): 2.5 mg via ORAL
  Filled 2024-03-13 (×8): qty 1

## 2024-03-13 MED ORDER — LOSARTAN POTASSIUM 25 MG PO TABS
25.0000 mg | ORAL_TABLET | Freq: Every day | ORAL | Status: DC
  Administered 2024-03-13: 25 mg via ORAL
  Filled 2024-03-13: qty 1

## 2024-03-13 MED ORDER — SODIUM CHLORIDE 0.9% IV SOLUTION: Freq: Once | INTRAVENOUS | Status: AC

## 2024-03-13 MED ORDER — ACETAMINOPHEN 325 MG PO TABS
650.0000 mg | ORAL_TABLET | Freq: Four times a day (QID) | ORAL | Status: DC | PRN
  Administered 2024-03-15 – 2024-03-17 (×2): 650 mg via ORAL
  Filled 2024-03-13 (×2): qty 2

## 2024-03-13 NOTE — Progress Notes (Signed)
 Brief nephrology follow-up  Serum sodium level 119.  Discontinue 3% saline.  Received a dose of Lasix  as well.  Communicated with the nurse.  Continue monitor lab.  Krystal Maddox, [facility]

## 2024-03-13 NOTE — Progress Notes (Addendum)
 [facility] NEPHROLOGY PROGRESS NOTE  Assessment/ Plan: Pt is a 83 y.o. yo female  with past medical history significant for aortic stenosis status post TAVR, hypertension, CAD, osteoporosis, PVD, chronic hyponatremia presented with generalized abdominal pain and peripheral edema for a week, seen as a consultation for the evaluation of hyponatremia and hyperkalemia.  Serum sodium level was 109 on admission.  # Severe acute on chronic hyponatremia: Patient with history of hyponatremia in the past and noted HCTZ on her med list.  Examination consistent with fluid overload and fortunately mental status around her baseline.  TSH and cortisol levels unremarkable. Received a dose of furosemide  40 mg yesterday without much improvement of sodium.  Started 3% saline later in the evening with improvement of sodium level to 114.  Clinically and mentation remains stable and around her baseline.   I will order another dose of Lasix  today and continue 3% saline.  Avoid rapid sodium correction.  Check serum sodium level every 3 hours.    # Hyperkalemia: Noted ARB listed as home medication.  Improved with medical management and diuretics.  # CKD 3B: Creatinine level around baseline.   # Severe anemia, GI is following for possible bleeding.  Blood transfusion ordered.  Subjective: Seen and examined at bedside.  Patient is alert awake and asking about me in [place].  Her family member was at bedside.  Urine output around 2.3 L in 24 hours.  Communicated with ICU team last night and adjusted medication.  Objective Vital signs in last 24 hours: Vitals:   03/13/24 0610 03/13/24 0700 03/13/24 0756 03/13/24 0800  BP: (!) 146/58 (!) 158/61  (!) 160/74  Pulse: 68 66  74  Resp: 15 16  (!) 21  Temp:   97.7 F (36.5 C)   TempSrc:   Oral   SpO2: 99% 97%  99%  Weight:      Height:       Weight change:   Intake/Output Summary (Last 24 hours) at 03/13/2024 1100 Last data filed at 03/13/2024 0820 Gross  per 24 hour  Intake 869.22 ml  Output 2700 ml  Net -1830.78 ml       Labs: RENAL PANEL Recent Labs  Lab 03/12/24 1346 03/12/24 1743 03/12/24 2016 03/12/24 2312 03/13/24 0418  NA 109* 106* 110*  109* 111* 114*  K 6.0* 5.1 5.0 4.7 4.8  CL 74* 73* 76* 77* 80*  CO2 20* 22 22 23 23   GLUCOSE 205* 175* 127* 116* 92  BUN 38* 37* 36* 36* 33*  CREATININE 1.13* 1.19* 1.12* 1.15* 1.18*  CALCIUM 8.1* 8.1* 8.3* 8.3* 8.4*  MG  --   --  2.7* 2.4 2.5*  PHOS  --   --   --  4.5 5.3*  ALBUMIN 2.7*  --   --   --   --     Liver Function Tests: Recent Labs  Lab 03/12/24 1346  AST 33  ALT 31  ALKPHOS 115  BILITOT 0.5  PROT 7.5  ALBUMIN 2.7*   Recent Labs  Lab 03/12/24 1346  LIPASE 50   No results for input(s): AMMONIA in the last 168 hours. CBC: Recent Labs    03/12/24 1346 03/12/24 1440 03/12/24 2312 03/13/24 0418  HGB 5.7*  --  7.0* 6.8*  MCV 67.7*  --  68.9* 69.2*  VITAMINB12  --  2,263*  --   --   FOLATE  --  18.1  --   --   FERRITIN  --  46  --   --  TIBC  --  447  --   --   IRON  --  9*  --   --   RETICCTPCT  --  3.5*  --   --     Cardiac Enzymes: No results for input(s): CKTOTAL, CKMB, CKMBINDEX, TROPONINI in the last 168 hours. CBG: Recent Labs  Lab 03/12/24 1704 03/12/24 1919 03/12/24 2311 03/13/24 0310 03/13/24 0751  GLUCAP 201* 152* 123* 99 172*    Iron Studies:  Recent Labs    03/12/24 1440  IRON 9*  TIBC 447  FERRITIN 46   Studies/Results: DG Chest Portable 1 View Result Date: 03/12/2024 CLINICAL DATA:  Shortness of breath EXAM: PORTABLE CHEST 1 VIEW COMPARISON:  08/16/2021 FINDINGS: Cardiomegaly, vascular congestion. Right basilar opacity. No confluent opacity on the left. No overt edema or effusions. No acute bony abnormality. Aortic atherosclerosis. IMPRESSION: Cardiomegaly, vascular congestion. Right basilar atelectasis or infiltrate. Electronically Signed   By: Franky Crease M.D.   On: 03/12/2024 15:02     Medications: Infusions:  sodium chloride  (hypertonic) 25 mL/hr at 03/13/24 0700    Scheduled Medications:  Chlorhexidine  Gluconate Cloth  6 each Topical Daily   furosemide   40 mg Intravenous Once   insulin  aspart  0-15 Units Subcutaneous Q4H   pantoprazole  (PROTONIX ) IV  40 mg Intravenous Q12H    have reviewed scheduled and prn medications.  Physical Exam: General:NAD, comfortable Heart:RRR, s1s2 nl Lungs:clear b/l, no crackle Abdomen:soft, Non-tender, non-distended Extremities: Bilateral extremities pitting edema++ Neurology: Alert, awake and following commands  Krystal Maddox 03/13/2024,11:00 AM  LOS: 1 day

## 2024-03-13 NOTE — Progress Notes (Signed)
 eLink Physician-Brief Progress Note Patient Name: Krystal Maddox DOB: Jan 22, 1941 MRN: [id number]   Date of Service  03/13/2024  HPI/Events of Note  Tolerating liq, asking to advance diet to soft or as tolerated, Na 122 at 19:24  Seen awake and conversant  eICU Interventions  Advance diet as tolerated     Intervention Category Intermediate Interventions: Other:;Electrolyte abnormality - evaluation and management  Damien ONEIDA Grout 03/13/2024, 9:32 PM

## 2024-03-13 NOTE — Progress Notes (Signed)
 NAMEBaylin Maddox, MRN:  [id number], DOB:  1941-04-01, LOS: 1 ADMISSION DATE:  03/12/2024, CONSULTATION DATE:  03/12/2024 REFERRING MD:  Dr. Dean - EDP, CHIEF COMPLAINT:  Hyponatremia and GIB    History of Present Illness:  Krystal Maddox is a 83 y.o. female with a past medical history significant for severe aortic stenosis now s/p TAVR, hypertension, CAD, osteoporosis, and PVD who presented to the ED at [facility] with complaints of generalized abdominal pain with decreased appetite that began 1 week prior to admission.  Again, patient was seen tachypneic and slightly hypotensive.  Labs also revealed multiple medical derangements including sodium 109, potassium 6.0, glucose 205, creatinine 1.13 with GFR 48, albumin 2.9, WBC 13.3, hemoglobin 5.7 with positive fecal occult stool.  Given severe metabolic derangements including severe hyponatremia decision was made to admit per PCCM for close monitoring in the ICU.  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 Verlin and Dr Lucas.   Severe aortic stenosis    Superficial vein thrombosis 05/21/2021   R arm   Vitamin D deficiency      Significant Hospital Events: Including procedures, antibiotic start and stop dates in addition to other pertinent events   7/5 with complaints of abdominal pain found to go to multiple metabolic derangements including NA 109 admitted to ICU  Interim History / Subjective:  Complaining of bilateral leg pain Serum sodium improved to 117, remains on hypertonic saline at 25 cc/h  Objective    Blood pressure (!) 166/60, pulse 78, temperature 97.6 F  (36.4 C), temperature source Oral, resp. rate (!) 21, height 5' 1 (1.549 m), weight 72.1 kg, SpO2 98%.        Intake/Output Summary (Last 24 hours) at 03/13/2024 1227 Last data filed at 03/13/2024 0820 Gross per 24 hour  Intake 869.22 ml  Output 2700 ml  Net -1830.78 ml   Filed Weights   03/12/24 1816 03/13/24 0553  Weight: 74.7 kg 72.1 kg    Examination: General: Chronically ill-appearing female, lying on the bed HEENT: [place]/AT, eyes anicteric.  moist mucus membranes Neuro: Alert, awake following commands Chest: Coarse breath sounds, no wheezes or rhonchi Heart: Regular rate and rhythm, no murmurs or gallops Abdomen: Soft, nontender, nondistended, bowel sounds present Extremities: 3+ pitting edema noted  Labs and images reviewed  Patient Lines/Drains/Airways Status     Active Line/Drains/Airways     Name Placement date Placement time Site Days   Peripheral IV 03/12/24 20 G Anterior;Left Forearm 03/12/24  1347  Forearm  1   Peripheral IV 03/12/24 22 G 1 Right;Anterior Forearm 03/12/24  2019  Forearm  1   External Urinary Catheter 03/12/24  1745  --  1       She is receiving PRBC Resolved problem list   Assessment and Plan  Acute on chronic hyponatremia, likely hypervolemic Patient presented with serum sodium of 109, previously she was hyponatremic  She is on hydrochlorothiazide, which should be stopped at discharge Denies dizziness She does have bilateral 3+ pitting edema Appreciate nephrology follow-up Patient is on 3% hypertonic saline and  received Lasix  x 2 with improvement in serum sodium currently at 119 Continue hypertonic saline Repeat serum sodium every 3-4 hours Slowly correct serum sodium with a goal 8 per 24 hours  Iron deficiency anemia Probably GI bleeding Positive fecal occult stool on admission  Iron studies are consistent with iron deficiency anemia Received 2 units of PRBC so far Repeat CBC is pending, last hemoglobin is 6.8 GI is following,  plan for EGD and colonoscopy once sodium improves Continue Protonix  twice daily  CKD stage 3a Hyperkalemia, corrected Serum creatinine is at baseline, monitor intake and output Avoid nephrotoxic agent Hyperkalemia has corrected, down to 4.8 now  History of severe aortic stenosis now S/P TAVR Chronic HFpEF Monitor intake and output Avoid nephrotoxic agent GDMT as tolerated  Prediabetes Patient hemoglobin A1c 6.4 Continue sliding scale insulin  CBG goal 140-180  Best Practice (right click and Reselect all SmartList Selections daily)   Diet/type: Regular diet DVT prophylaxis SCD Pressure ulcer(s): N/A GI prophylaxis: PPI Lines: N/A Foley:  N/A Code Status:  full code Last date of multidisciplinary goals of care discussion: 7/6: Patient was updated at bedside with the help of [place] interpreter  Labs   CBC: Recent Labs  Lab 03/12/24 1346 03/12/24 2312 03/13/24 0418  WBC 13.3* 13.6* 13.0*  NEUTROABS 10.7*  --   --   HGB 5.7* 7.0* 6.8*  HCT 18.0* 20.6* 20.9*  MCV 67.7* 68.9* 69.2*  PLT 366 378 388    Basic Metabolic Panel: Recent Labs  Lab 03/12/24 1346 03/12/24 1743 03/12/24 2016 03/12/24 2312 03/13/24 0418 03/13/24 1038  NA 109* 106* 110*  109* 111* 114* 117*  K 6.0* 5.1 5.0 4.7 4.8  --   CL 74* 73* 76* 77* 80*  --   CO2 20* 22 22 23 23   --   GLUCOSE 205* 175* 127* 116* 92  --   BUN 38* 37* 36* 36* 33*  --   CREATININE 1.13* 1.19* 1.12* 1.15* 1.18*  --   CALCIUM 8.1* 8.1* 8.3* 8.3* 8.4*  --   MG  --   --  2.7* 2.4 2.5*  --   PHOS  --   --   --  4.5 5.3*  --    GFR: Estimated Creatinine Clearance: 32.2 mL/min (A) (by C-G formula based on SCr of 1.18 mg/dL (H)). Recent Labs  Lab 03/12/24 1346 03/12/24 2312 03/13/24 0418  WBC 13.3* 13.6* 13.0*    Liver Function Tests: Recent Labs  Lab 03/12/24 1346  AST 33  ALT 31  ALKPHOS 115  BILITOT 0.5  PROT 7.5  ALBUMIN 2.7*   Recent Labs  Lab 03/12/24 1346  LIPASE 50   No results for  input(s): AMMONIA in the last 168 hours.  ABG    Component Value Date/Time   PHART 7.428 08/12/2021 1236   PCO2ART 39.3 08/12/2021 1236   PO2ART 96.7 08/12/2021 1236   HCO3 25.5 08/12/2021 1236   TCO2 26 08/13/2021 1150   ACIDBASEDEF 1.0 05/17/2021 1335   O2SAT 97.8 08/12/2021 1236     Coagulation Profile: Recent Labs  Lab 03/12/24 1428  INR 1.0    Cardiac Enzymes: No results for input(s): CKTOTAL, CKMB, CKMBINDEX, TROPONINI in the last 168 hours.  HbA1C: Hgb A1c MFr Bld  Date/Time Value Ref Range Status  03/12/2024 05:43 PM 6.4 (H) 4.8 - 5.6 % Final    Comment:    (NOTE) Diagnosis of Diabetes The following HbA1c ranges recommended by the American Diabetes Association (ADA) may be used as  an aid in the diagnosis of diabetes mellitus.  Hemoglobin             Suggested A1C NGSP%              Diagnosis  <5.7                   Non Diabetic  5.7-6.4                Pre-Diabetic  >6.4                   Diabetic  <7.0                   Glycemic control for                       adults with diabetes.      CBG: Recent Labs  Lab 03/12/24 1919 03/12/24 2311 03/13/24 0310 03/13/24 0751 03/13/24 1126  GLUCAP 152* 123* 99 172* 125*    The patient is critically ill due to hyponatremia/hyperkalemia requiring titration of hypertonic saline.  Thank critical care was necessary to treat or prevent imminent or life-threatening deterioration.  Critical care was time spent personally by me on the following activities: development of treatment plan with patient and/or surrogate as well as nursing, discussions with consultants, evaluation of patient's response to treatment, examination of patient, obtaining history from patient or surrogate, ordering and performing treatments and interventions, ordering and review of laboratory studies, ordering and review of radiographic studies, pulse oximetry, re-evaluation of patient's condition and participation in  multidisciplinary rounds.   During this encounter critical care time was devoted to patient care services described in this note for 4 50 minutes.     Valinda Novas, MD [facility] See Amion for pager If no response to pager, please call [phone] until 7pm After 7pm, Please call E-link [phone]

## 2024-03-14 DIAGNOSIS — E875 Hyperkalemia: Secondary | ICD-10-CM | POA: Diagnosis not present

## 2024-03-14 DIAGNOSIS — E871 Hypo-osmolality and hyponatremia: Secondary | ICD-10-CM | POA: Diagnosis not present

## 2024-03-14 DIAGNOSIS — I5032 Chronic diastolic (congestive) heart failure: Secondary | ICD-10-CM | POA: Diagnosis not present

## 2024-03-14 DIAGNOSIS — D500 Iron deficiency anemia secondary to blood loss (chronic): Secondary | ICD-10-CM

## 2024-03-14 DIAGNOSIS — R195 Other fecal abnormalities: Secondary | ICD-10-CM

## 2024-03-14 DIAGNOSIS — D509 Iron deficiency anemia, unspecified: Secondary | ICD-10-CM | POA: Diagnosis not present

## 2024-03-14 DIAGNOSIS — R109 Unspecified abdominal pain: Secondary | ICD-10-CM

## 2024-03-14 LAB — BPAM RBC
Blood Product Expiration Date: 202507232359
Blood Product Expiration Date: 202507292359
ISSUE DATE / TIME: 202507060523
ISSUE DATE / TIME: 202507051736
Unit Type and Rh: 6200
Unit Type and Rh: 6200

## 2024-03-14 LAB — BASIC METABOLIC PANEL WITH GFR
Anion gap: 10 (ref 5–15)
Anion gap: 10 (ref 5–15)
BUN: 31 mg/dL — ABNORMAL HIGH (ref 8–23)
BUN: 32 mg/dL — ABNORMAL HIGH (ref 8–23)
CO2: 23 mmol/L (ref 22–32)
CO2: 25 mmol/L (ref 22–32)
Calcium: 8.5 mg/dL — ABNORMAL LOW (ref 8.9–10.3)
Calcium: 8.7 mg/dL — ABNORMAL LOW (ref 8.9–10.3)
Chloride: 90 mmol/L — ABNORMAL LOW (ref 98–111)
Chloride: 89 mmol/L — ABNORMAL LOW (ref 98–111)
Creatinine, Ser: 1.2 mg/dL — ABNORMAL HIGH (ref 0.44–1.00)
Creatinine, Ser: 1.29 mg/dL — ABNORMAL HIGH (ref 0.44–1.00)
GFR, Estimated: 45 mL/min — ABNORMAL LOW (ref >60)
GFR, Estimated: 41 mL/min — ABNORMAL LOW (ref >60)
Glucose, Bld: 135 mg/dL — ABNORMAL HIGH (ref 70–99)
Glucose, Bld: 209 mg/dL — ABNORMAL HIGH (ref 70–99)
Potassium: 5.9 mmol/L — ABNORMAL HIGH (ref 3.5–5.1)
Potassium: 5.1 mmol/L (ref 3.5–5.1)
Sodium: 123 mmol/L — ABNORMAL LOW (ref 135–145)
Sodium: 124 mmol/L — ABNORMAL LOW (ref 135–145)

## 2024-03-14 LAB — CBC
HCT: 26.3 % — ABNORMAL LOW (ref 36.0–46.0)
Hemoglobin: 8.2 g/dL — ABNORMAL LOW (ref 12.0–15.0)
MCH: 23 pg — ABNORMAL LOW (ref 26.0–34.0)
MCHC: 31.2 g/dL (ref 30.0–36.0)
MCV: 73.7 fL — ABNORMAL LOW (ref 80.0–100.0)
Platelets: 437 K/uL — ABNORMAL HIGH (ref 150–400)
RBC: 3.57 MIL/uL — ABNORMAL LOW (ref 3.87–5.11)
RDW: 18.2 % — ABNORMAL HIGH (ref 11.5–15.5)
WBC: 10.5 K/uL (ref 4.0–10.5)
nRBC: 0 % (ref 0.0–0.2)

## 2024-03-14 LAB — TYPE AND SCREEN
ABO/RH(D): A POS
Antibody Screen: NEGATIVE
Unit division: 0
Unit division: 0

## 2024-03-14 LAB — GLUCOSE, CAPILLARY
Glucose-Capillary: 134 mg/dL — ABNORMAL HIGH (ref 70–99)
Glucose-Capillary: 161 mg/dL — ABNORMAL HIGH (ref 70–99)
Glucose-Capillary: 192 mg/dL — ABNORMAL HIGH (ref 70–99)
Glucose-Capillary: 125 mg/dL — ABNORMAL HIGH (ref 70–99)
Glucose-Capillary: 212 mg/dL — ABNORMAL HIGH (ref 70–99)

## 2024-03-14 LAB — SODIUM: Sodium: 122 mmol/L — ABNORMAL LOW (ref 135–145)

## 2024-03-14 LAB — PHOSPHORUS: Phosphorus: 5 mg/dL — ABNORMAL HIGH (ref 2.5–4.6)

## 2024-03-14 LAB — MAGNESIUM: Magnesium: 2.3 mg/dL (ref 1.7–2.4)

## 2024-03-14 MED ORDER — SODIUM ZIRCONIUM CYCLOSILICATE 10 G PO PACK
10.0000 g | PACK | ORAL | Status: DC
  Administered 2024-03-14: 10 g via ORAL
  Filled 2024-03-14: qty 1

## 2024-03-14 MED ORDER — FUROSEMIDE 10 MG/ML IJ SOLN
40.0000 mg | Freq: Once | INTRAMUSCULAR | Status: AC
  Administered 2024-03-14: 40 mg via INTRAVENOUS
  Filled 2024-03-14: qty 4

## 2024-03-14 MED ORDER — HYDROXYZINE HCL 10 MG PO TABS
10.0000 mg | ORAL_TABLET | Freq: Three times a day (TID) | ORAL | Status: DC | PRN
  Administered 2024-03-16 – 2024-03-17 (×2): 10 mg via ORAL
  Filled 2024-03-14 (×2): qty 1

## 2024-03-14 MED ORDER — GABAPENTIN 100 MG PO CAPS
100.0000 mg | ORAL_CAPSULE | Freq: Two times a day (BID) | ORAL | Status: DC
  Administered 2024-03-14 – 2024-03-18 (×9): 100 mg via ORAL
  Filled 2024-03-14 (×9): qty 1

## 2024-03-14 MED ORDER — POLYETHYLENE GLYCOL 3350 17 G PO PACK
17.0000 g | PACK | Freq: Every day | ORAL | Status: DC
  Administered 2024-03-14 – 2024-03-18 (×5): 17 g via ORAL
  Filled 2024-03-14 (×5): qty 1

## 2024-03-14 MED ORDER — SODIUM ZIRCONIUM CYCLOSILICATE 10 G PO PACK
10.0000 g | PACK | Freq: Once | ORAL | Status: AC
  Administered 2024-03-14: 10 g via ORAL
  Filled 2024-03-14: qty 1

## 2024-03-14 MED ORDER — SODIUM ZIRCONIUM CYCLOSILICATE 10 G PO PACK: 10.0000 g | PACK | Freq: Two times a day (BID) | ORAL | Status: DC

## 2024-03-14 MED ORDER — HYDRALAZINE HCL 25 MG PO TABS
25.0000 mg | ORAL_TABLET | Freq: Three times a day (TID) | ORAL | Status: DC
  Administered 2024-03-14 – 2024-03-18 (×14): 25 mg via ORAL
  Filled 2024-03-14 (×14): qty 1

## 2024-03-14 MED ORDER — HYDRALAZINE HCL 10 MG PO TABS: 10.0000 mg | ORAL_TABLET | Freq: Three times a day (TID) | ORAL | Status: DC

## 2024-03-14 MED ORDER — FERROUS SULFATE 325 (65 FE) MG PO TABS
325.0000 mg | ORAL_TABLET | Freq: Two times a day (BID) | ORAL | Status: DC
  Administered 2024-03-14 – 2024-03-18 (×9): 325 mg via ORAL
  Filled 2024-03-14 (×10): qty 1

## 2024-03-14 NOTE — Progress Notes (Signed)
 Follow up BMP with stable Na+ . One more dose of lokelma  today. Transferring tot he floor today. TRH to assume care tomorrow.   Krystal SHAUNNA Gaskins, DO 03/14/24 2:54 PM [facility]  For contact information, see Amion. If no response to pager, please call PCCM consult pager. After hours, 7PM- 7AM, please call Elink.

## 2024-03-14 NOTE — Progress Notes (Signed)
 NAMENitara Maddox, MRN:  [id number], DOB:  [birth year], LOS: 2 ADMISSION DATE:  03/12/2024, CONSULTATION DATE:  03/12/2024 REFERRING MD:  Dr. Dean - EDP, CHIEF COMPLAINT:  Hyponatremia and GIB    History of Present Illness:  Krystal Maddox is a 83 y.o. female with a past medical history significant for severe aortic stenosis now s/p TAVR, hypertension, CAD, osteoporosis, and PVD who presented to the ED at [facility] with complaints of generalized abdominal pain with decreased appetite that began 1 week prior to admission.  Again, patient was seen tachypneic and slightly hypotensive.  Labs also revealed multiple medical derangements including sodium 109, potassium 6.0, glucose 205, creatinine 1.13 with GFR 48, albumin 2.9, WBC 13.3, hemoglobin 5.7 with positive fecal occult stool.  Given severe metabolic derangements including severe hyponatremia decision was made to admit per PCCM for close monitoring in the ICU.  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 Verlin and Dr Lucas.   Severe aortic stenosis    Superficial vein thrombosis 05/21/2021   R arm   Vitamin D deficiency      Significant Hospital Events: Including procedures, antibiotic start and stop dates in addition to other pertinent events   7/5 with complaints of abdominal pain found to go to multiple metabolic derangements including Na+ 109 admitted to ICU  Interim History / Subjective:  Complains of numbness in toes bilaterally. No other    Objective    Blood pressure 137/71, pulse 87, temperature 97.8 F (36.6 C), temperature source Oral, resp. rate 20,  height 5' 1 (1.549 m), weight 72.1 kg, SpO2 95%.        Intake/Output Summary (Last 24 hours) at 03/14/2024 9271 Last data filed at 03/14/2024 0600 Gross per 24 hour  Intake 881.92 ml  Output 3650 ml  Net -2768.08 ml   Filed Weights   03/12/24 1816 03/13/24 0553  Weight: 74.7 kg 72.1 kg    Examination: General: elderly woman sitting up in bed in NAD HEENT: [place]/AT, eyes anicteric Neuro: awake, alert, able to speak minimal English that seems appropriate and she can answer questions via translator  Chest: breathing comfortably on RA, CTAB Heart:  S1S2, RRR Abdomen: soft, NT Extremities: improving LE edema, mild pitting. No synovitis or erythema on toes.  Na+ 123 K+ 5.9 BUN 31 Cr 1.2 WBC 10.5 H/H 8.2/26.3 Platelets 427   Resolved problem list   Assessment and Plan  Acute on chronic hyponatremia, hypervolemic. Corrected with hypertonic saline & diuresis -no plans to restart HCTZ -ok to d/c q3h Na+ checks -recheck BMP this afternoon -appreciate nephrology  Iron- deficiency anemia Probably chronic GI bleeding- Positive fecal occult stool on admission  Iron studies are consistent with iron deficiency anemia -start enteral iron & bowel regimen -GI is following, plan for EGD and colonoscopy once sodium improves. May be able to do as OP -Continue Protonix  twice daily -hopefully can start DVT prophylaxis in the coming days if Hb remains stable  CKD stage 3a Hyperkalemia, persistent -stop ARB -lokelma  -repeat BMP this afternoon -unfortunately still needs PPI at this point; would  like to d/c if she doesn't continue to need long-term  History of severe aortic stenosis now S/P TAVR in 2022 Chronic HFpEF -control HTN -intolerant of ARB; con't hydralazine   Prediabetes, A1c 6.4 -SSI PRN -goal bG 140-180  One family member at bedside during rounds with phone translator on speakerphone.   Hopefully can transfer out of ICU later today.   Best Practice (right click and  Reselect all SmartList Selections daily)   Diet/type: Regular diet DVT prophylaxis SCD Pressure ulcer(s): N/A GI prophylaxis: PPI Lines: N/A Foley:  N/A Code Status:  full code Last date of multidisciplinary goals of care discussion: 7/6: Patient was updated at bedside with the help of [place] interpreter  Labs   CBC: Recent Labs  Lab 03/12/24 1346 03/12/24 2312 03/13/24 0418 03/13/24 1349 03/14/24 0413  WBC 13.3* 13.6* 13.0*  --  10.5  NEUTROABS 10.7*  --   --   --   --   HGB 5.7* 7.0* 6.8* 8.3* 8.2*  HCT 18.0* 20.6* 20.9* 24.3* 26.3*  MCV 67.7* 68.9* 69.2*  --  73.7*  PLT 366 378 388  --  437*    Basic Metabolic Panel: Recent Labs  Lab 03/12/24 1743 03/12/24 2016 03/12/24 2312 03/13/24 0418 03/13/24 1038 03/13/24 1349 03/13/24 1924 03/13/24 2300 03/14/24 0413  NA 106* 110*  109* 111* 114* 117* 119* 122* 122* 123*  K 5.1 5.0 4.7 4.8  --   --   --   --  5.9*  CL 73* 76* 77* 80*  --   --   --   --  90*  CO2 22 22 23 23   --   --   --   --  23  GLUCOSE 175* 127* 116* 92  --   --   --   --  135*  BUN 37* 36* 36* 33*  --   --   --   --  31*  CREATININE 1.19* 1.12* 1.15* 1.18*  --   --   --   --  1.20*  CALCIUM 8.1* 8.3* 8.3* 8.4*  --   --   --   --  8.5*  MG  --  2.7* 2.4 2.5*  --   --   --   --  2.3  PHOS  --   --  4.5 5.3*  --   --   --   --  5.0*   GFR: Estimated Creatinine Clearance: 31.7 mL/min (A) (by C-G formula based on SCr of 1.2 mg/dL (H)). Recent Labs  Lab 03/12/24 1346 03/12/24 2312 03/13/24 0418 03/14/24 0413  WBC 13.3* 13.6* 13.0* 10.5    Liver Function Tests: Recent Labs  Lab 03/12/24 1346  AST 33  ALT 31  ALKPHOS 115  BILITOT 0.5  PROT 7.5  ALBUMIN 2.7*   Recent Labs  Lab 03/12/24 1346  LIPASE 50   Leita Krystal Gaskins, DO 03/14/24 8:43 AM [facility]  For contact information, see Amion. If no response to pager, please call PCCM consult pager. After hours, 7PM- 7AM, please call Elink.

## 2024-03-14 NOTE — Progress Notes (Signed)
**Krystal Krystal**  Krystal Krystal  Assessment/ Plan: Pt is a 83 y.o. yo female  with past medical history significant for aortic stenosis status post TAVR, hypertension, CAD, osteoporosis, PVD, chronic hyponatremia presented with generalized abdominal pain and peripheral edema for a week, seen as a consultation for the evaluation of hyponatremia and hyperkalemia.  Serum sodium level was 109 on admission.  # Severe acute on chronic hyponatremia: Patient with history of hyponatremia in the past and noted HCTZ on her med list - add to allergies.  Examination consistent with fluid overload and fortunately mental status around her baseline.  TSH and cortisol levels unremarkable. Nadir 106 on 7/5.  Started 3% on 7/5 which continued until 7/6 afternoon.   She is being diuresed - net neg 2.7L yesterday, net neg 4.5 for the admission Remains hypervolemic - redose lasix  40 IV today.  Agree with backing off frequency of Na checks = next 2pm then AM.    # Hyperkalemia: ARB on hold, K 5.9 this AM, given lokelma .  As above cortisol is normal.  Cont to trend.   # CKD 3B: Creatinine level around baseline.    # Severe anemia, GI is following for possible bleeding and plans scopes when hypoNa improved.  Has required transfusion.  Hb stable in 8s this AM.   Subjective: Seen and examined at bedside.  Awake, alert.  Ate good breakfast.  Urine output around 3.7L yesterday/24h.  Objective Vital signs in last 24 hours: Vitals:   03/14/24 0400 03/14/24 0500 03/14/24 0600 03/14/24 0700  BP: (!) 145/48 (!) 125/50 (!) 136/46 137/71  Pulse: 68 67 68 87  Resp: 16 15 17 20   Temp:      TempSrc:      SpO2: 93% 94% 95% 95%  Weight:      Height:       Weight change:   Intake/Output Summary (Last 24 hours) at 03/14/2024 0817 Last data filed at 03/14/2024 0600 Gross per 24 hour  Intake 856.94 ml  Output 3300 ml  Net -2443.06 ml       Labs: RENAL PANEL Recent Labs  Lab 03/12/24 1346  03/12/24 1743 03/12/24 2016 03/12/24 2312 03/13/24 0418 03/13/24 1038 03/13/24 1349 03/13/24 1924 03/13/24 2300 03/14/24 0413  NA 109* 106* 110*  109* 111* 114* 117* 119* 122* 122* 123*  K 6.0* 5.1 5.0 4.7 4.8  --   --   --   --  5.9*  CL 74* 73* 76* 77* 80*  --   --   --   --  90*  CO2 20* 22 22 23 23   --   --   --   --  23  GLUCOSE 205* 175* 127* 116* 92  --   --   --   --  135*  BUN 38* 37* 36* 36* 33*  --   --   --   --  31*  CREATININE 1.13* 1.19* 1.12* 1.15* 1.18*  --   --   --   --  1.20*  CALCIUM 8.1* 8.1* 8.3* 8.3* 8.4*  --   --   --   --  8.5*  MG  --   --  2.7* 2.4 2.5*  --   --   --   --  2.3  PHOS  --   --   --  4.5 5.3*  --   --   --   --  5.0*  ALBUMIN 2.7*  --   --   --   --   --   --   --   --   --  Liver Function Tests: Recent Labs  Lab 03/12/24 1346  AST 33  ALT 31  ALKPHOS 115  BILITOT 0.5  PROT 7.5  ALBUMIN 2.7*   Recent Labs  Lab 03/12/24 1346  LIPASE 50   No results for input(s): AMMONIA in the last 168 hours. CBC: Recent Labs    03/12/24 1346 03/12/24 1440 03/12/24 2312 03/13/24 0418 03/13/24 1349 03/14/24 0413  HGB 5.7*  --  7.0* 6.8* 8.3* 8.2*  MCV 67.7*  --  68.9* 69.2*  --  73.7*  VITAMINB12  --  2,263*  --   --   --   --   FOLATE  --  18.1  --   --   --   --   FERRITIN  --  46  --   --   --   --   TIBC  --  447  --   --   --   --   IRON  --  9*  --   --   --   --   RETICCTPCT  --  3.5*  --   --   --   --     Cardiac Enzymes: No results for input(s): CKTOTAL, CKMB, CKMBINDEX, TROPONINI in the last 168 hours. CBG: Recent Labs  Lab 03/13/24 1645 03/13/24 1920 03/13/24 2258 03/14/24 0318 03/14/24 0719  GLUCAP 107* 123* 247* 134* 161*    Iron Studies:  Recent Labs    03/12/24 1440  IRON 9*  TIBC 447  FERRITIN 46   Studies/Results: DG Chest Portable 1 View Result Date: 03/12/2024 CLINICAL DATA:  Shortness of breath EXAM: PORTABLE CHEST 1 VIEW COMPARISON:  08/16/2021 FINDINGS: Cardiomegaly, vascular  congestion. Right basilar opacity. No confluent opacity on the left. No overt edema or effusions. No acute bony abnormality. Aortic atherosclerosis. IMPRESSION: Cardiomegaly, vascular congestion. Right basilar atelectasis or infiltrate. Electronically Signed   By: Franky Crease M.D.   On: 03/12/2024 15:02    Medications: Infusions:    Scheduled Medications:  Chlorhexidine  Gluconate Cloth  6 each Topical Daily   gabapentin   100 mg Oral BID   hydrALAZINE   10 mg Oral Q8H   insulin  aspart  0-15 Units Subcutaneous Q4H   pantoprazole  (PROTONIX ) IV  40 mg Intravenous Q12H   sodium zirconium cyclosilicate   10 g Oral Q4H    have reviewed scheduled and prn medications.  Physical Exam: General:NAD, comfortable Heart:RRR, s1s2 nl Lungs:clear b/l, no crackle Abdomen:soft, Non-tender, non-distended Extremities: Bilateral extremities pitting edema++  Neurology: Alert, awake and following commands  Krystal Krystal 03/14/2024,8:17 AM  LOS: 2 days

## 2024-03-14 NOTE — Progress Notes (Signed)
 Pt transfered to 2W -06 Her personal belonging and cane were all present on transfer to unit

## 2024-03-14 NOTE — Progress Notes (Addendum)
 Daily Progress Note  DOA: 03/12/2024 Hospital Day: 3   Cc:  severe iron deficiency anemia  Brief History:  83 y.o. year old female with a medical history including but not limited to CKD3, severe AS s/p RAVR, chronic hyponatremia. Admitted 7/5 by PCCM for abdominal pain, poor appetite. She has multiple metabolic derangements including severe acute on chronic hyponatremia. Found to have severe anemia with FOBT+. See 7/5 GI consult note  ASSESSMENT    83 yo [place] female ( non-English speaking) with severe iron deficiency anemia  FOBT+ and abdominal pain  Presenting hgb 5.7, down ~ 3 grams compared to two years ago.  >>Today: Patient unable to speak [place]. Son present but doesn't speak [place]. Hgb improved and stable at 8.2 with 2 u RBCs. No overt GI bleeding but started on oral iron so stool may turn black.   Severe acute on chronic hyponatremia Nephrology following . Being treated with 3% saline, frequent rechecks of sodium levels.  >>Today: Na 124, K+ 5.1  CKD3b Creatinine around baseline   Principal Problem:   Acute hyponatremia Active Problems:   Severe aortic stenosis   Peripheral arterial disease (HCC)   CKD (chronic kidney disease), stage III (HCC)   S/P TAVR (transcatheter aortic valve replacement)   Hypertension   Symptomatic anemia   Abdominal pain   PLAN   --Needs EGD and colonoscopy after correction of hyponatremia. Will locate interpreter to discuss .  --Additionally, planning for CTAP at some point ( preferably with contrast if renal function allows)  Subjective   Unable to communicate but doesn't express any concerns right now  Objective   Recent Labs    03/12/24 2312 03/13/24 0418 03/13/24 1349 03/14/24 0413  WBC 13.6* 13.0*  --  10.5  HGB 7.0* 6.8* 8.3* 8.2*  HCT 20.6* 20.9* 24.3* 26.3*  MCV 68.9* 69.2*  --  73.7*  PLT 378 388  --  437*   Recent Labs    03/12/24 1440  FOLATE 18.1  VITAMINB12 2,263*  FERRITIN 46  TIBC 447   IRONPCTSAT 2*   Recent Labs    03/13/24 0418 03/13/24 1038 03/13/24 2300 03/14/24 0413 03/14/24 1210  NA 114*   < > 122* 123* 124*  K 4.8  --   --  5.9* 5.1  CL 80*  --   --  90* 89*  CO2 23  --   --  23 25  GLUCOSE 92  --   --  135* 209*  BUN 33*  --   --  31* 32*  CREATININE 1.18*  --   --  1.20* 1.29*  CALCIUM 8.4*  --   --  8.5* 8.7*   < > = values in this interval not displayed.   Recent Labs    03/12/24 1346  PROT 7.5  ALBUMIN 2.7*  AST 33  ALT 31  ALKPHOS 115  BILITOT 0.5      Imaging:  DG Chest Portable 1 View CLINICAL DATA:  Shortness of breath  EXAM: PORTABLE CHEST 1 VIEW  COMPARISON:  08/16/2021  FINDINGS: Cardiomegaly, vascular congestion. Right basilar opacity. No confluent opacity on the left. No overt edema or effusions. No acute bony abnormality. Aortic atherosclerosis.  IMPRESSION: Cardiomegaly, vascular congestion.  Right basilar atelectasis or infiltrate.  Electronically Signed   By: Franky Crease M.D.   On: 03/12/2024 15:02     Scheduled inpatient medications:   Chlorhexidine  Gluconate Cloth  6 each Topical Daily   ferrous sulfate   325  mg Oral BID WC   gabapentin   100 mg Oral BID   hydrALAZINE   25 mg Oral Q8H   insulin  aspart  0-15 Units Subcutaneous Q4H   pantoprazole  (PROTONIX ) IV  40 mg Intravenous Q12H   polyethylene glycol  17 g Oral Daily   sodium zirconium cyclosilicate   10 g Oral Once   Continuous inpatient infusions:  PRN inpatient medications: acetaminophen , docusate sodium , hydrOXYzine , ipratropium-albuterol , Muscle Rub, mouth rinse, oxyCODONE , polyethylene glycol  Vital signs in last 24 hours: Temp:  [97.8 F (36.6 C)-97.9 F (36.6 C)] 97.9 F (36.6 C) (07/07 0800) Pulse Rate:  [60-96] 83 (07/07 1400) Resp:  [13-22] 17 (07/07 1400) BP: (108-162)/(39-79) 132/49 (07/07 1400) SpO2:  [91 %-97 %] 96 % (07/07 1400) Last BM Date : 03/12/24  Intake/Output Summary (Last 24 hours) at 03/14/2024 1543 Last data filed  at 03/14/2024 1430 Gross per 24 hour  Intake 366.78 ml  Output 4300 ml  Net -3933.22 ml    Intake/Output from previous day: 07/06 0701 - 07/07 0700 In: 881.9 [P.O.:360; I.V.:206.9; Blood:315] Out: 3650 [Urine:3650] Intake/Output this shift: Total I/O In: -  Out: 1900 [Urine:1900]   Physical Exam:  General: Alert female in NAD Heart:  Regular rate and rhythm.  Pulmonary: Normal respiratory effort. Expiratory crackles throughout both lungs Abdomen: Soft, nondistended.  There  is fullness in mid upper abdomen but exam not optimal due to her inability to communicate and participate in exam. Normal bowel sounds. Extremities: 2+ BLE edema.   Neurologic: Alert and oriented      LOS: 2 days   Vina Dasen ,NP 03/14/2024, 3:43 PM   I have taken an interval history, thoroughly reviewed the chart and examined the patient. I agree with the Advanced Practitioner's note, impression and recommendations, and have recorded additional findings, impressions and recommendations below. I performed a substantive portion of this encounter (>50% time spent), including a complete performance of the medical decision making.  My additional thoughts are as follows:  She needs an EGD and colonoscopy later this week, timing TBD hopeful further improvement in her serum sodium level and when we are able to get the appropriate interpreter to discuss the procedures and BARs. No overt GI bleeding, and patient is clinically stable.  [name] Office:[phone]

## 2024-03-15 DIAGNOSIS — E871 Hypo-osmolality and hyponatremia: Secondary | ICD-10-CM | POA: Diagnosis not present

## 2024-03-15 LAB — CBC
HCT: 27.5 % — ABNORMAL LOW (ref 36.0–46.0)
Hemoglobin: 8.7 g/dL — ABNORMAL LOW (ref 12.0–15.0)
MCH: 23.4 pg — ABNORMAL LOW (ref 26.0–34.0)
MCHC: 31.6 g/dL (ref 30.0–36.0)
MCV: 73.9 fL — ABNORMAL LOW (ref 80.0–100.0)
Platelets: 488 K/uL — ABNORMAL HIGH (ref 150–400)
RBC: 3.72 MIL/uL — ABNORMAL LOW (ref 3.87–5.11)
RDW: 18.9 % — ABNORMAL HIGH (ref 11.5–15.5)
WBC: 14.1 K/uL — ABNORMAL HIGH (ref 4.0–10.5)
nRBC: 0 % (ref 0.0–0.2)

## 2024-03-15 LAB — BASIC METABOLIC PANEL WITH GFR
Anion gap: 8 (ref 5–15)
BUN: 36 mg/dL — ABNORMAL HIGH (ref 8–23)
CO2: 26 mmol/L (ref 22–32)
Calcium: 8.6 mg/dL — ABNORMAL LOW (ref 8.9–10.3)
Chloride: 93 mmol/L — ABNORMAL LOW (ref 98–111)
Creatinine, Ser: 1.24 mg/dL — ABNORMAL HIGH (ref 0.44–1.00)
GFR, Estimated: 43 mL/min — ABNORMAL LOW (ref >60)
Glucose, Bld: 91 mg/dL (ref 70–99)
Potassium: 5 mmol/L (ref 3.5–5.1)
Sodium: 127 mmol/L — ABNORMAL LOW (ref 135–145)

## 2024-03-15 LAB — GLUCOSE, CAPILLARY
Glucose-Capillary: 114 mg/dL — ABNORMAL HIGH (ref 70–99)
Glucose-Capillary: 124 mg/dL — ABNORMAL HIGH (ref 70–99)
Glucose-Capillary: 139 mg/dL — ABNORMAL HIGH (ref 70–99)
Glucose-Capillary: 139 mg/dL — ABNORMAL HIGH (ref 70–99)
Glucose-Capillary: 148 mg/dL — ABNORMAL HIGH (ref 70–99)
Glucose-Capillary: 208 mg/dL — ABNORMAL HIGH (ref 70–99)

## 2024-03-15 LAB — PHOSPHORUS: Phosphorus: 3.9 mg/dL (ref 2.5–4.6)

## 2024-03-15 MED ORDER — FUROSEMIDE 10 MG/ML IJ SOLN
20.0000 mg | Freq: Once | INTRAMUSCULAR | Status: AC
  Administered 2024-03-15: 20 mg via INTRAVENOUS
  Filled 2024-03-15: qty 2

## 2024-03-15 MED ORDER — FUROSEMIDE 10 MG/ML IJ SOLN: 40.0000 mg | Freq: Once | INTRAMUSCULAR | Status: DC

## 2024-03-15 MED ORDER — PRAVASTATIN SODIUM 40 MG PO TABS
80.0000 mg | ORAL_TABLET | Freq: Every evening | ORAL | Status: DC
  Administered 2024-03-15 – 2024-03-17 (×3): 80 mg via ORAL
  Filled 2024-03-15 (×3): qty 2

## 2024-03-15 NOTE — Progress Notes (Signed)
 Transition of Care [facility]) - Inpatient Brief Assessment   Patient Details  Name: Krystal Maddox MRN: [id number] Date of Birth: Jul 25, 1941  [facility] CM/SW Contact:    Rosaline JONELLE Joe, RN Phone Number: 03/15/2024, 1:19 PM   Clinical Narrative: Patient admitted from home with Acute Hyponatremia.  Patient has planned GI procedure planned for tomorrow per MD notes.  No TOC needs at this time but CM will continue to follow the patient for [facility] needs as patient progresses.   Transition of Care Asessment: Insurance and Status: (P) Insurance coverage has been reviewed Patient has primary care physician: (P) Yes Home environment has been reviewed: (P) from home with family   Prior/Current Home Services: (P) Current home services (Active with [facility]) Social Drivers of Health Review: (P) SDOH reviewed no interventions necessary Readmission risk has been reviewed: (P) Yes Transition of care needs: (P) no transition of care needs at this time

## 2024-03-15 NOTE — Progress Notes (Addendum)
 Daily Progress Note  DOA: 03/12/2024 Hospital Day: 4   Cc: Severe iron deficiency anemia with  Brief History:  83 y.o. year old female with a medical history including but not limited to CKD3, severe AS s/p RAVR, chronic hyponatremia. Admitted 7/5 by PCCM for abdominal pain, poor appetite. She has multiple metabolic derangements including severe acute on chronic hyponatremia. Found to have severe anemia with FOBT+. See 7/5 GI consult note   ASSESSMENT    83 yo [place] female ( non-English speaking) with severe iron deficiency anemia  FOBT+ and abdominal pain  Presenting hgb 5.7, down ~ 3 grams compared to two years ago.  >>Today: Hgb improved and stable at 8.7 . No overt GI bleeding but started on oral iron so stool may turn black.    Severe acute on chronic hyponatremia >>Today: Na up to 127,  K+ 5.0    CKD3b Creatinine around baseline  Principal Problem:   Acute hyponatremia Active Problems:   Severe aortic stenosis   Peripheral arterial disease (HCC)   CKD (chronic kidney disease), stage III (HCC)   S/P TAVR (transcatheter aortic valve replacement)   Hypertension   Symptomatic anemia   Abdominal pain   Iron deficiency anemia due to chronic blood loss   Heme positive stool   PLAN   Plan is to proceed with EGD and colonoscopy tomorrow to evaluate IDA. Due to language barrier I am unable to communicate with patient or family about endoscopic procedures.  I paged [place] interpreter but haven't yet received a call back. In the interim will put on clear liquid diet.    Subjective   Unable to communicate but she doesn't express any concerns   Objective   GI Studies:    Recent Labs    03/13/24 0418 03/13/24 1349 03/14/24 0413 03/15/24 0331  WBC 13.0*  --  10.5 14.1*  HGB 6.8* 8.3* 8.2* 8.7*  HCT 20.9* 24.3* 26.3* 27.5*  MCV 69.2*  --  73.7* 73.9*  PLT 388  --  437* 488*   Recent Labs    03/12/24 1440  FOLATE 18.1  VITAMINB12 2,263*  FERRITIN 46   TIBC 447  IRONPCTSAT 2*   Recent Labs    03/14/24 0413 03/14/24 1210 03/15/24 0331  NA 123* 124* 127*  K 5.9* 5.1 5.0  CL 90* 89* 93*  CO2 23 25 26   GLUCOSE 135* 209* 91  BUN 31* 32* 36*  CREATININE 1.20* 1.29* 1.24*  CALCIUM 8.5* 8.7* 8.6*   Recent Labs    03/12/24 1346  PROT 7.5  ALBUMIN 2.7*  AST 33  ALT 31  ALKPHOS 115  BILITOT 0.5      Imaging:  DG Chest Portable 1 View CLINICAL DATA:  Shortness of breath  EXAM: PORTABLE CHEST 1 VIEW  COMPARISON:  08/16/2021  FINDINGS: Cardiomegaly, vascular congestion. Right basilar opacity. No confluent opacity on the left. No overt edema or effusions. No acute bony abnormality. Aortic atherosclerosis.  IMPRESSION: Cardiomegaly, vascular congestion.  Right basilar atelectasis or infiltrate.  Electronically Signed   By: Franky Crease M.D.   On: 03/12/2024 15:02     Scheduled inpatient medications:   Chlorhexidine  Gluconate Cloth  6 each Topical Daily   ferrous sulfate   325 mg Oral BID WC   gabapentin   100 mg Oral BID   hydrALAZINE   25 mg Oral Q8H   insulin  aspart  0-15 Units Subcutaneous Q4H   pantoprazole  (PROTONIX ) IV  40 mg Intravenous Q12H   polyethylene glycol  17 g Oral Daily   pravastatin   80 mg Oral QPM   Continuous inpatient infusions:  PRN inpatient medications: acetaminophen , docusate sodium , hydrOXYzine , ipratropium-albuterol , Muscle Rub, mouth rinse, oxyCODONE , polyethylene glycol  Vital signs in last 24 hours: Temp:  [97.6 F (36.4 C)-98.3 F (36.8 C)] 97.6 F (36.4 C) (07/08 1142) Pulse Rate:  [79-102] 93 (07/08 1142) Resp:  [13-25] 18 (07/08 1142) BP: (108-170)/(40-110) 144/56 (07/08 1142) SpO2:  [92 %-99 %] 99 % (07/08 1142) Weight:  [69.2 kg-70.2 kg] 69.2 kg (07/08 0701) Last BM Date : 03/13/24  Intake/Output Summary (Last 24 hours) at 03/15/2024 1146 Last data filed at 03/14/2024 2100 Gross per 24 hour  Intake 120 ml  Output 2200 ml  Net -2080 ml    Intake/Output from  previous day: 07/07 0701 - 07/08 0700 In: 120 [P.O.:120] Out: 3000 [Urine:3000] Intake/Output this shift: No intake/output data recorded.   Physical Exam:  General: Alert female in NAD Heart:  Regular rate and rhythm.  Pulmonary: Normal respiratory effort Abdomen: Soft, nondistended, nontender. Normal bowel sounds. Extremities: No lower extremity edema  Psych: Pleasant. Cooperative     LOS: 3 days   Vina Dasen ,NP 03/15/2024, 11:46 AM   I have taken an interval history, thoroughly reviewed the chart and examined the patient. I agree with the Advanced Practitioner's note, impression and recommendations, and have recorded additional findings, impressions and recommendations below. I performed a substantive portion of this encounter (>50% time spent), including a complete performance of the medical decision making.  My additional thoughts are as follows:  Rising WBC today for unclear reasons  To clarify, we are ready to make plans for this patient's upper endoscopy and colonoscopy, but despite significant efforts to do so, we have been unable to reach the specific interpreter(s) needed to communicate with this patient about the procedure and its risk/benefits. Unless until that happens, we cannot get informed consent to proceed with endoscopy. Any assistance from nursing or support staff to facilitate that would be greatly appreciated.   [name] Office:[phone]

## 2024-03-15 NOTE — Progress Notes (Addendum)
 PROGRESS NOTE    Krystal Maddox  FMW:[id number] DOB: 07-May-1941 DOA: 03/12/2024 PCP: Cloria Annabella CROME, DO    Brief Narrative:   Krystal Maddox is a 83 y.o. female with past medical history significant for with past medical history significant for severe AS s/p TAVR, HTN, CAD, DM2, PVD, osteoporosis who presented to [facility] ED on 03/12/2024 with complaints of generalized abdominal pain, decreased appetite.  Patient reports onset of symptoms roughly 1 week prior.  In the ED, churn a 7.7 F, HR 71, RR 24, BP 163/42, SpO2 94% on room air.  WBC 13.3, hemoglobin 5.7, MCV 67.7, platelet count of 366.  Sodium 109, potassium 6.0, chloride 74, CO2 20, glucose 205, BUN 38, creatinine 1.13.  Lipase 50, AST 33, ALT 31, total bilirubin 0.5.  High sensitive troponin 12.  Anemia panel iron 9, TIBC 447, ferritin 46, folate 18.1, vitamin B12 2263.  Urinalysis unrevealing.  FOBT positive.  Chest x-ray with cardiomegaly, vascular congestion, right basilar atelectasis versus infiltrate.  Nephrology and gastroenterology were consulted.  Patient was administered IV Lasix , IV fluids, Protonix  IV, insulin , dextrose  and Lokelma .  PCCM consulted for admission and patient was admitted to the intensive care unit given multiple severe electrolyte derangements, anemia.  Significant hospital events: 7/5: Admit to ICU, nephrology and GI consulted, transfuse 1 unit PRBC, Lasix  40 IV x 1, started on 3% saline infusion 7/6: Transfused 1 unit PRBC, Lasix  40 IV x1, 3% saline infusion discontinued 7/7: Lasix  40 IV x 1 7/8: Na 127, GI plans EGD/colonoscopy tomorrow  Assessment & Plan:   Acute on chronic hyponatremia Hypochloremia Etiology likely multifactorial in the setting of hypervolemia and outpatient use of hydrochlorothiazide.  Sodium level on admission 109, chloride 74.  Started on 3% normal saline infusion and intermittent IV Lasix  with gradual improvement of sodium level. -- Nephrology following, appreciate assistance -- HCTZ  discontinued added to allergy list -- Na 109>106>>124>127 -- Continue) IV Lasix  per nephrology -- BMP daily  Hyperkalemia: Resolved Patient presenting with a potassium of 6.0 in the setting of losartan  use outpatient.  Patient administered insulin , dextrose  and Lokelma  in the ED. -- K 6.0>>5.1>5.0 -- BMP in am  Symptomatic iron deficiency anemia Concern for chronic GI bleed Patient presenting with a hemoglobin of 5.7, MCV 67.7.  FOBT positive.  Anemia panel with  iron 9, TIBC 447, ferritin 46, folate 18.1, vitamin B12 2263 -- [place] [facility] following, appreciate assistance -- Transfused 2 unit PRBCs during his hospitalization, last on 7/6 -- Hgb 5.7>7.0>6.8>8.3>8.2>8.7 -- Protonix  40 mg IV every 12 hours -- Ferrous sulfate  325 mg p.o. twice daily -- GI planning EGD/colonoscopy tomorrow, n.p.o. after midnight -- CBC daily  HTN Home regimen includes amlodipine  10 mg p.o. daily, losartan -HCTZ 100-25 mg p.o. daily, furosemide  20 mg p.o. daily -- HCTZ discontinued and added to allergy list per nephrology -- Holding home losartan , amlodipine  -- Hydralazine  25 mg p.o. every 8 hours  -- Receiving intermittent IV Lasix  per nephrology -- Continue to monitor BP closely  DM2 On glipizide 5 mg p.o. twice daily, Tradjenta 5 mg p.o. daily, and NovoLog  at baseline.  Hemoglobin A1c 6.4 on 03/12/2024, well-controlled. -- Moderate SSI for coverage -- CBG before every meal/at bedtime  HLD -- Pravastatin  80 mg p.o. daily   GERD -- Continue PPI  DVT prophylaxis: SCDs Start: 03/12/24 1642    Code Status: Full Code Family Communication: Updated family present at bedside this morning  Disposition Plan:  Level of care: Telemetry Medical Status is: Inpatient Remains inpatient appropriate  because: Needs further improvement of sodium, pending EGD/colonoscopy tomorrow per GI    Consultants:  PCCM: Signed off 7/8 Nephrology [place] [facility]  Procedures:  None  Antimicrobials:   None   Subjective: Patient seen examined bedside, resting company.  Lying in bed.  Eating breakfast.  Family present.  Sodium up to 127 this morning.  Discussed with nephrology, Dr. Norine this morning.  Plan to continue intermittent IV Lasix  due to volume overload and persistent lower extremity edema.  Patient with no questions or concerns at this time.  Denies headache, no chest pain, no shortness of breath, no abdominal pain, no dizziness.  No acute events overnight per nursing staff.  Objective: Vitals:   03/14/24 2317 03/15/24 0423 03/15/24 0701 03/15/24 0726  BP: (!) 153/62 (!) 156/57  (!) 153/68  Pulse: 99 100  (!) 101  Resp: 20 17  18   Temp:  98 F (36.7 C)  97.7 F (36.5 C)  TempSrc:  Oral    SpO2: 95% 98%  96%  Weight:   69.2 kg   Height:        Intake/Output Summary (Last 24 hours) at 03/15/2024 1128 Last data filed at 03/14/2024 2100 Gross per 24 hour  Intake 120 ml  Output 2200 ml  Net -2080 ml   Filed Weights   03/13/24 0553 03/14/24 2000 03/15/24 0701  Weight: 72.1 kg 70.2 kg 69.2 kg    Examination:  Physical Exam: GEN: NAD, alert and oriented, elderly in appearance HEENT: NCAT, PERRL, EOMI, sclera clear, MMM PULM: CTAB w/o wheezes/crackles, normal respiratory effort, on room air CV: RRR w/o M/G/R GI: abd soft, NTND, + BS MSK: + lower extremity peripheral edema, moves all extremities with preserved muscle strength NEURO: No focal neurological deficit PSYCH: normal mood/affect Integumentary: No concerning rashes/lesions/wounds noted on exposed skin surfaces    Data Reviewed: I have personally reviewed following labs and imaging studies  CBC: Recent Labs  Lab 03/12/24 1346 03/12/24 2312 03/13/24 0418 03/13/24 1349 03/14/24 0413 03/15/24 0331  WBC 13.3* 13.6* 13.0*  --  10.5 14.1*  NEUTROABS 10.7*  --   --   --   --   --   HGB 5.7* 7.0* 6.8* 8.3* 8.2* 8.7*  HCT 18.0* 20.6* 20.9* 24.3* 26.3* 27.5*  MCV 67.7* 68.9* 69.2*  --  73.7* 73.9*  PLT  366 378 388  --  437* 488*   Basic Metabolic Panel: Recent Labs  Lab 03/12/24 2016 03/12/24 2312 03/13/24 0418 03/13/24 1038 03/13/24 1924 03/13/24 2300 03/14/24 0413 03/14/24 1210 03/15/24 0331  NA 110*  109* 111* 114*   < > 122* 122* 123* 124* 127*  K 5.0 4.7 4.8  --   --   --  5.9* 5.1 5.0  CL 76* 77* 80*  --   --   --  90* 89* 93*  CO2 22 23 23   --   --   --  23 25 26   GLUCOSE 127* 116* 92  --   --   --  135* 209* 91  BUN 36* 36* 33*  --   --   --  31* 32* 36*  CREATININE 1.12* 1.15* 1.18*  --   --   --  1.20* 1.29* 1.24*  CALCIUM 8.3* 8.3* 8.4*  --   --   --  8.5* 8.7* 8.6*  MG 2.7* 2.4 2.5*  --   --   --  2.3  --   --   PHOS  --  4.5 5.3*  --   --   --  5.0*  --  3.9   < > = values in this interval not displayed.   GFR: Estimated Creatinine Clearance: 30.1 mL/min (A) (by C-G formula based on SCr of 1.24 mg/dL (H)). Liver Function Tests: Recent Labs  Lab 03/12/24 1346  AST 33  ALT 31  ALKPHOS 115  BILITOT 0.5  PROT 7.5  ALBUMIN 2.7*   Recent Labs  Lab 03/12/24 1346  LIPASE 50   No results for input(s): AMMONIA in the last 168 hours. Coagulation Profile: Recent Labs  Lab 03/12/24 1428  INR 1.0   Cardiac Enzymes: No results for input(s): CKTOTAL, CKMB, CKMBINDEX, TROPONINI in the last 168 hours. BNP (last 3 results) No results for input(s): PROBNP in the last 8760 hours. HbA1C: Recent Labs    03/12/24 1743  HGBA1C 6.4*   CBG: Recent Labs  Lab 03/14/24 1520 03/14/24 1959 03/15/24 0017 03/15/24 0425 03/15/24 0724  GLUCAP 125* 212* 139* 114* 139*   Lipid Profile: No results for input(s): CHOL, HDL, LDLCALC, TRIG, CHOLHDL, LDLDIRECT in the last 72 hours. Thyroid Function Tests: Recent Labs    03/12/24 1743  TSH 1.464   Anemia Panel: Recent Labs    03/12/24 1440  VITAMINB12 2,263*  FOLATE 18.1  FERRITIN 46  TIBC 447  IRON 9*  RETICCTPCT 3.5*   Sepsis Labs: No results for input(s): PROCALCITON,  LATICACIDVEN in the last 168 hours.  Recent Results (from the past 240 hours)  MRSA Next Gen by PCR, Nasal     Status: None   Collection Time: 03/12/24  5:44 PM   Specimen: Nasal Mucosa; Nasal Swab  Result Value Ref Range Status   MRSA by PCR Next Gen NOT DETECTED NOT DETECTED Final    Comment: (NOTE) The GeneXpert MRSA Assay (FDA approved for NASAL specimens only), is one component of a comprehensive MRSA colonization surveillance program. It is not intended to diagnose MRSA infection nor to guide or monitor treatment for MRSA infections. Test performance is not FDA approved in patients less than 59 years old. Performed at [facility], [address], [place], [place] [address]          Radiology Studies: No results found.      Scheduled Meds:  Chlorhexidine  Gluconate Cloth  6 each Topical Daily   ferrous sulfate   325 mg Oral BID WC   gabapentin   100 mg Oral BID   hydrALAZINE   25 mg Oral Q8H   insulin  aspart  0-15 Units Subcutaneous Q4H   pantoprazole  (PROTONIX ) IV  40 mg Intravenous Q12H   polyethylene glycol  17 g Oral Daily   Continuous Infusions:   LOS: 3 days    Time spent: 51 minutes spent on 03/15/2024 caring for this patient face-to-face including chart review, ordering labs/tests, documenting, discussion with nursing staff, consultants, updating family and interview/physical exam    Camellia PARAS [place], DO [facility] Available via Epic secure chat 7am-7pm After these hours, please refer to coverage provider listed on [email or web address] 03/15/2024, 11:28 AM

## 2024-03-15 NOTE — Progress Notes (Addendum)
 [facility] NEPHROLOGY PROGRESS NOTE  Assessment/ Plan: Pt is a 83 y.o. yo female  with past medical history significant for aortic stenosis status post TAVR, hypertension, CAD, osteoporosis, PVD, chronic hyponatremia presented with generalized abdominal pain and peripheral edema for a week, seen as a consultation for the evaluation of hyponatremia and hyperkalemia.  Serum sodium level was 109 on admission.  # Severe acute on chronic hyponatremia: Patient with history of hyponatremia in the past and noted HCTZ on her med list - added to allergies.  Also presented hypervolemic TSH and cortisol levels unremarkable. Nadir sodium 106 on 7/5.  Started 3% on 7/5 which continued until 7/6 afternoon.   She is being diuresed - net neg 2.8L yesterday, net neg 7.4 for the admission Sodium improving to 127 this morning Remains hypervolemic - redose lasix  20 IV x1 today. (Lower dose as approaching euvolemia and will be NPOpMN for scopes) On clears today in prep for scopes but once on diet use low na/fluid restricted   # Hyperkalemia: ARB on hold, K 5 this AM.  As above cortisol is normal.  Cont to trend.   # CKD 3B: Creatinine level around baseline 1,2,  # Severe anemia, GI is following for possible bleeding and plans scopes tomorrow.  Has required transfusion.  Hb stable in 8s this AM.   Subjective: Seen and examined at bedside.  Awake, alert.  Good appetite. Urine output around 3L yesterday/24h.  Objective Vital signs in last 24 hours: Vitals:   03/15/24 0423 03/15/24 0701 03/15/24 0726 03/15/24 1142  BP: (!) 156/57  (!) 153/68 (!) 144/56  Pulse: 100  (!) 101 93  Resp: 17  18 18   Temp: 98 F (36.7 C)  97.7 F (36.5 C) 97.6 F (36.4 C)  TempSrc: Oral   Oral  SpO2: 98%  96% 99%  Weight:  69.2 kg    Height:       Weight change:   Intake/Output Summary (Last 24 hours) at 03/15/2024 1326 Last data filed at 03/14/2024 2100 Gross per 24 hour  Intake 120 ml  Output 1600 ml  Net  -1480 ml       Labs: RENAL PANEL Recent Labs  Lab 03/12/24 1346 03/12/24 1743 03/12/24 2016 03/12/24 2312 03/13/24 0418 03/13/24 1038 03/13/24 1924 03/13/24 2300 03/14/24 0413 03/14/24 1210 03/15/24 0331  NA 109*   < > 110*  109* 111* 114*   < > 122* 122* 123* 124* 127*  K 6.0*   < > 5.0 4.7 4.8  --   --   --  5.9* 5.1 5.0  CL 74*   < > 76* 77* 80*  --   --   --  90* 89* 93*  CO2 20*   < > 22 23 23   --   --   --  23 25 26   GLUCOSE 205*   < > 127* 116* 92  --   --   --  135* 209* 91  BUN 38*   < > 36* 36* 33*  --   --   --  31* 32* 36*  CREATININE 1.13*   < > 1.12* 1.15* 1.18*  --   --   --  1.20* 1.29* 1.24*  CALCIUM 8.1*   < > 8.3* 8.3* 8.4*  --   --   --  8.5* 8.7* 8.6*  MG  --   --  2.7* 2.4 2.5*  --   --   --  2.3  --   --  PHOS  --   --   --  4.5 5.3*  --   --   --  5.0*  --  3.9  ALBUMIN 2.7*  --   --   --   --   --   --   --   --   --   --    < > = values in this interval not displayed.    Liver Function Tests: Recent Labs  Lab 03/12/24 1346  AST 33  ALT 31  ALKPHOS 115  BILITOT 0.5  PROT 7.5  ALBUMIN 2.7*   Recent Labs  Lab 03/12/24 1346  LIPASE 50   No results for input(s): AMMONIA in the last 168 hours. CBC: Recent Labs    03/12/24 1440 03/12/24 2312 03/13/24 0418 03/13/24 1349 03/14/24 0413 03/15/24 0331  HGB  --  7.0* 6.8* 8.3* 8.2* 8.7*  MCV  --  68.9* 69.2*  --  73.7* 73.9*  VITAMINB12 2,263*  --   --   --   --   --   FOLATE 18.1  --   --   --   --   --   FERRITIN 46  --   --   --   --   --   TIBC 447  --   --   --   --   --   IRON 9*  --   --   --   --   --   RETICCTPCT 3.5*  --   --   --   --   --     Cardiac Enzymes: No results for input(s): CKTOTAL, CKMB, CKMBINDEX, TROPONINI in the last 168 hours. CBG: Recent Labs  Lab 03/14/24 1959 03/15/24 0017 03/15/24 0425 03/15/24 0724 03/15/24 1139  GLUCAP 212* 139* 114* 139* 148*    Iron Studies:  Recent Labs    03/12/24 1440  IRON 9*  TIBC 447  FERRITIN 46    Studies/Results: No results found.   Medications: Infusions:    Scheduled Medications:  Chlorhexidine  Gluconate Cloth  6 each Topical Daily   ferrous sulfate   325 mg Oral BID WC   gabapentin   100 mg Oral BID   hydrALAZINE   25 mg Oral Q8H   insulin  aspart  0-15 Units Subcutaneous Q4H   pantoprazole  (PROTONIX ) IV  40 mg Intravenous Q12H   polyethylene glycol  17 g Oral Daily   pravastatin   80 mg Oral QPM    have reviewed scheduled and prn medications.  Physical Exam: General:NAD, comfortable Heart:RRR, s1s2 nl Lungs:clear b/l, no crackle Abdomen:soft, Non-tender, non-distended Extremities: Bilateral extremities pitting edema+ Neurology: Alert, awake and following commands  Krystal Maddox 03/15/2024,1:26 PM  LOS: 3 days

## 2024-03-15 NOTE — Progress Notes (Signed)
 Interpreters available by phone Mon-Fri 8:30am-5pm (entire admission)* [facility] Ph # [phone]  [place] Ph # [phone]  After-hours/weekend services - [place] Pager # [phone]  Onsite sessions Ph # [phone] [facility] Interpreter Services

## 2024-03-16 ENCOUNTER — Inpatient Hospital Stay (HOSPITAL_COMMUNITY): Payer: Medicare (Managed Care)

## 2024-03-16 DIAGNOSIS — I340 Nonrheumatic mitral (valve) insufficiency: Secondary | ICD-10-CM | POA: Diagnosis not present

## 2024-03-16 DIAGNOSIS — I361 Nonrheumatic tricuspid (valve) insufficiency: Secondary | ICD-10-CM

## 2024-03-16 DIAGNOSIS — E871 Hypo-osmolality and hyponatremia: Secondary | ICD-10-CM | POA: Diagnosis not present

## 2024-03-16 LAB — GLUCOSE, CAPILLARY
Glucose-Capillary: 119 mg/dL — ABNORMAL HIGH (ref 70–99)
Glucose-Capillary: 121 mg/dL — ABNORMAL HIGH (ref 70–99)
Glucose-Capillary: 270 mg/dL — ABNORMAL HIGH (ref 70–99)
Glucose-Capillary: 53 mg/dL — ABNORMAL LOW (ref 70–99)
Glucose-Capillary: 65 mg/dL — ABNORMAL LOW (ref 70–99)
Glucose-Capillary: 67 mg/dL — ABNORMAL LOW (ref 70–99)
Glucose-Capillary: 71 mg/dL (ref 70–99)
Glucose-Capillary: 72 mg/dL (ref 70–99)
Glucose-Capillary: 74 mg/dL (ref 70–99)
Glucose-Capillary: 99 mg/dL (ref 70–99)

## 2024-03-16 LAB — ECHOCARDIOGRAM COMPLETE
Height: 61 in
S' Lateral: 3.1 cm
Weight: 2409.19 [oz_av]

## 2024-03-16 LAB — CBC
HCT: 29.2 % — ABNORMAL LOW (ref 36.0–46.0)
Hemoglobin: 8.9 g/dL — ABNORMAL LOW (ref 12.0–15.0)
MCH: 22.6 pg — ABNORMAL LOW (ref 26.0–34.0)
MCHC: 30.5 g/dL (ref 30.0–36.0)
MCV: 74.1 fL — ABNORMAL LOW (ref 80.0–100.0)
Platelets: 535 K/uL — ABNORMAL HIGH (ref 150–400)
RBC: 3.94 MIL/uL (ref 3.87–5.11)
RDW: 19.5 % — ABNORMAL HIGH (ref 11.5–15.5)
WBC: 12 K/uL — ABNORMAL HIGH (ref 4.0–10.5)
nRBC: 0 % (ref 0.0–0.2)

## 2024-03-16 LAB — BASIC METABOLIC PANEL WITH GFR
Anion gap: 9 (ref 5–15)
BUN: 26 mg/dL — ABNORMAL HIGH (ref 8–23)
CO2: 25 mmol/L (ref 22–32)
Calcium: 8.6 mg/dL — ABNORMAL LOW (ref 8.9–10.3)
Chloride: 95 mmol/L — ABNORMAL LOW (ref 98–111)
Creatinine, Ser: 0.97 mg/dL (ref 0.44–1.00)
GFR, Estimated: 58 mL/min — ABNORMAL LOW (ref >60)
Glucose, Bld: 75 mg/dL (ref 70–99)
Potassium: 4.6 mmol/L (ref 3.5–5.1)
Sodium: 129 mmol/L — ABNORMAL LOW (ref 135–145)

## 2024-03-16 MED ORDER — NA SULFATE-K SULFATE-MG SULF 17.5-3.13-1.6 GM/177ML PO SOLN
0.5000 | Freq: Once | ORAL | Status: AC
  Administered 2024-03-16: 177 mL via ORAL
  Filled 2024-03-16: qty 1

## 2024-03-16 MED ORDER — NA SULFATE-K SULFATE-MG SULF 17.5-3.13-1.6 GM/177ML PO SOLN
0.5000 | Freq: Once | ORAL | Status: AC
  Administered 2024-03-16: 177 mL via ORAL

## 2024-03-16 MED ORDER — SIMETHICONE 80 MG PO CHEW
240.0000 mg | CHEWABLE_TABLET | Freq: Once | ORAL | Status: AC
  Administered 2024-03-16: 240 mg via ORAL
  Filled 2024-03-16: qty 3

## 2024-03-16 MED ORDER — DEXTROSE 50 % IV SOLN: 1.0000 | INTRAVENOUS | Status: DC | PRN

## 2024-03-16 MED ORDER — STERILE WATER FOR INJECTION IJ SOLN
INTRAMUSCULAR | Status: AC
  Filled 2024-03-16: qty 10

## 2024-03-16 NOTE — Progress Notes (Signed)
  Echocardiogram 2D Echocardiogram has been performed.  Tinnie FORBES Gosling RDCS 03/16/2024, 2:12 PM

## 2024-03-16 NOTE — Care Management Important Message (Addendum)
 Important Message  Patient Details  Name: [name] MRN: [id number] Date of Birth: 1941/05/01   Important Message Given:  Yes - Medicare IM  Had an interpreter come in and explaine the Important Message to the patient as she did not speak English nor did her visitor   Claretta Deed 03/16/2024, 9:11 AM

## 2024-03-16 NOTE — Progress Notes (Signed)
 [facility] NEPHROLOGY PROGRESS NOTE  Assessment/ Plan: Pt is a 83 y.o. yo female  with past medical history significant for aortic stenosis status post TAVR, hypertension, CAD, osteoporosis, PVD, chronic hyponatremia presented with generalized abdominal pain and peripheral edema for a week, seen as a consultation for the evaluation of hyponatremia and hyperkalemia.  Serum sodium level was 109 on admission.  # Severe acute on chronic hyponatremia: Patient with history of hyponatremia in the past and noted HCTZ on her med list - added to allergies.  Also presented hypervolemic. TSH and cortisol levels unremarkable. UA bland.  Nadir sodium 106 on 7/5.  Started 3% on 7/5 which continued until 7/6 afternoon.   She is being diuresed - net neg 2.8L yesterday, net neg 7.6 for the admission Sodium improving to 129 this morning Volume much improved - hold diuresis today NPO in prep for scopes but once on diet use low na/fluid restricted Check TTE as unclear why presented with hypervolemia   # Hyperkalemia: ARB on hold, K 4.6 this AM.  As above cortisol is normal.   # CKD 3B: Creatinine level around baseline 1.2 now at 1.   # Severe anemia, GI is following for possible bleeding and plans scopes today.  Has required transfusion.  Hb stable in 8s this AM.   Subjective: Seen and examined at bedside.  Awake, alert.  Good appetite. Urine output documented yesterday but suspect incomplete collection in setting of GI bowel prep.    Objective Vital signs in last 24 hours: Vitals:   03/16/24 0002 03/16/24 0500 03/16/24 0504 03/16/24 0824  BP: (!) 167/62  (!) 149/66 (!) 167/75  Pulse: 95   98  Resp:    20  Temp: 97.7 F (36.5 C)   97.9 F (36.6 C)  TempSrc: Oral     SpO2: 98%   95%  Weight:  68.3 kg    Height:       Weight change: -1 kg  Intake/Output Summary (Last 24 hours) at 03/16/2024 0956 Last data filed at 03/15/2024 1500 Gross per 24 hour  Intake --  Output 200 ml  Net  -200 ml       Labs: RENAL PANEL Recent Labs  Lab 03/12/24 1346 03/12/24 1743 03/12/24 2016 03/12/24 2312 03/13/24 0418 03/13/24 1038 03/13/24 2300 03/14/24 0413 03/14/24 1210 03/15/24 0331 03/16/24 0220  NA 109*   < > 110*  109* 111* 114*   < > 122* 123* 124* 127* 129*  K 6.0*   < > 5.0 4.7 4.8  --   --  5.9* 5.1 5.0 4.6  CL 74*   < > 76* 77* 80*  --   --  90* 89* 93* 95*  CO2 20*   < > 22 23 23   --   --  23 25 26 25   GLUCOSE 205*   < > 127* 116* 92  --   --  135* 209* 91 75  BUN 38*   < > 36* 36* 33*  --   --  31* 32* 36* 26*  CREATININE 1.13*   < > 1.12* 1.15* 1.18*  --   --  1.20* 1.29* 1.24* 0.97  CALCIUM 8.1*   < > 8.3* 8.3* 8.4*  --   --  8.5* 8.7* 8.6* 8.6*  MG  --   --  2.7* 2.4 2.5*  --   --  2.3  --   --   --   PHOS  --   --   --  4.5 5.3*  --   --  5.0*  --  3.9  --   ALBUMIN 2.7*  --   --   --   --   --   --   --   --   --   --    < > = values in this interval not displayed.    Liver Function Tests: Recent Labs  Lab 03/12/24 1346  AST 33  ALT 31  ALKPHOS 115  BILITOT 0.5  PROT 7.5  ALBUMIN 2.7*   Recent Labs  Lab 03/12/24 1346  LIPASE 50   No results for input(s): AMMONIA in the last 168 hours. CBC: Recent Labs    03/12/24 1440 03/12/24 2312 03/13/24 0418 03/13/24 1349 03/14/24 0413 03/15/24 0331 03/16/24 0220  HGB  --    < > 6.8* 8.3* 8.2* 8.7* 8.9*  MCV  --    < > 69.2*  --  73.7* 73.9* 74.1*  VITAMINB12 2,263*  --   --   --   --   --   --   FOLATE 18.1  --   --   --   --   --   --   FERRITIN 46  --   --   --   --   --   --   TIBC 447  --   --   --   --   --   --   IRON 9*  --   --   --   --   --   --   RETICCTPCT 3.5*  --   --   --   --   --   --    < > = values in this interval not displayed.    Cardiac Enzymes: No results for input(s): CKTOTAL, CKMB, CKMBINDEX, TROPONINI in the last 168 hours. CBG: Recent Labs  Lab 03/15/24 2042 03/15/24 2358 03/15/24 2359 03/16/24 0503 03/16/24 0822  GLUCAP 208* 65* 71 99  121*    Iron Studies:  No results for input(s): IRON, TIBC, TRANSFERRIN, FERRITIN in the last 72 hours.  Studies/Results: No results found.   Medications: Infusions:    Scheduled Medications:  Chlorhexidine  Gluconate Cloth  6 each Topical Daily   ferrous sulfate   325 mg Oral BID WC   gabapentin   100 mg Oral BID   hydrALAZINE   25 mg Oral Q8H   insulin  aspart  0-15 Units Subcutaneous Q4H   pantoprazole  (PROTONIX ) IV  40 mg Intravenous Q12H   polyethylene glycol  17 g Oral Daily   pravastatin   80 mg Oral QPM    have reviewed scheduled and prn medications.  Physical Exam: General:NAD, comfortable Heart:RRR, s1s2 nl Lungs:clear b/l, no crackle Abdomen:soft, Non-tender, non-distended Extremities: Bilateral extremities pitting edema+ Neurology: Alert, awake and following commands  Krystal Maddox 03/16/2024,9:56 AM  LOS: 4 days

## 2024-03-16 NOTE — Progress Notes (Addendum)
 Daily Progress Note  DOA: 03/12/2024 Hospital Day: 5   Cc: Severe iron deficiency anemia   Brief History:  83 y.o. year old female with a medical history including but not limited to CKD3, severe AS s/p RAVR, chronic hyponatremia. Admitted 7/5 by PCCM for abdominal pain, poor appetite. She has multiple metabolic derangements including severe acute on chronic hyponatremia. Found to have severe anemia with FOBT+. See 7/5 GI consult note   ASSESSMENT    83 yo [place] female ( non-English speaking) with severe iron deficiency anemia  FOBT+ and chronic abdominal pain  Presenting hgb 5.7, down ~ 3 grams compared to two years ago.  >>Today: Hgb stable at 8.9 . On oral iron so stool may turn black.  I was able to reach interpreter by phone today.  Patient gives a history of both intermittent black stools and generalized abdominal pain for years.  She cannot be any more descriptive.    Severe acute on chronic hyponatremia >>Today: Na up to 129   CKD3b Creatinine normal.  GFR 58   Principal Problem:   Acute hyponatremia Active Problems:   Severe aortic stenosis   Peripheral arterial disease (HCC)   CKD (chronic kidney disease), stage III (HCC)   S/P TAVR (transcatheter aortic valve replacement)   Hypertension   Symptomatic anemia   Abdominal pain   Iron deficiency anemia due to chronic blood loss   Heme positive stool   PLAN   --I was able to make contact with interpreter by phone (Risun Ksor).    The risks and benefits of EGD (with possible biopsies) and colonoscopy (with possible biopsies and removal of polyps) were discussed with the patient who agrees to proceed. -- Will plan for procedure to be done tomorrow.  Continue clear liquids today, will prep this evening.    Subjective   No complaints.  Through the interpreter I was able to determine that patient has a history of chronic generalized abdominal pain which she has had for many years.  She also gives a history of  chronic intermittent black stools.  No Pepto bismuth use in years.   Objective    Recent Labs    03/14/24 0413 03/15/24 0331 03/16/24 0220  WBC 10.5 14.1* 12.0*  HGB 8.2* 8.7* 8.9*  HCT 26.3* 27.5* 29.2*  MCV 73.7* 73.9* 74.1*  PLT 437* 488* 535*   Recent Labs    03/14/24 1210 03/15/24 0331 03/16/24 0220  NA 124* 127* 129*  K 5.1 5.0 4.6  CL 89* 93* 95*  CO2 25 26 25   GLUCOSE 209* 91 75  BUN 32* 36* 26*  CREATININE 1.29* 1.24* 0.97  CALCIUM 8.7* 8.6* 8.6*    Imaging:  DG Chest Portable 1 View CLINICAL DATA:  Shortness of breath  EXAM: PORTABLE CHEST 1 VIEW  COMPARISON:  08/16/2021  FINDINGS: Cardiomegaly, vascular congestion. Right basilar opacity. No confluent opacity on the left. No overt edema or effusions. No acute bony abnormality. Aortic atherosclerosis.  IMPRESSION: Cardiomegaly, vascular congestion.  Right basilar atelectasis or infiltrate.  Electronically Signed   By: Franky Crease M.D.   On: 03/12/2024 15:02    Scheduled inpatient medications:   Chlorhexidine  Gluconate Cloth  6 each Topical Daily   ferrous sulfate   325 mg Oral BID WC   gabapentin   100 mg Oral BID   hydrALAZINE   25 mg Oral Q8H   insulin  aspart  0-15 Units Subcutaneous Q4H   pantoprazole  (PROTONIX ) IV  40 mg Intravenous Q12H  polyethylene glycol  17 g Oral Daily   pravastatin   80 mg Oral QPM   Continuous inpatient infusions:  PRN inpatient medications: acetaminophen , docusate sodium , hydrOXYzine , ipratropium-albuterol , Muscle Rub, mouth rinse, oxyCODONE , polyethylene glycol  Vital signs in last 24 hours: Temp:  [97.6 F (36.4 C)-98.1 F (36.7 C)] 97.9 F (36.6 C) (07/09 0824) Pulse Rate:  [86-103] 98 (07/09 0824) Resp:  [18-20] 20 (07/09 0824) BP: (144-182)/(56-75) 167/75 (07/09 0824) SpO2:  [95 %-99 %] 95 % (07/09 0824) Weight:  [68.3 kg] 68.3 kg (07/09 0500) Last BM Date : 03/13/24  Intake/Output Summary (Last 24 hours) at 03/16/2024 1139 Last data filed at  03/15/2024 1500 Gross per 24 hour  Intake --  Output 200 ml  Net -200 ml    Intake/Output from previous day: 07/08 0701 - 07/09 0700 In: -  Out: 200 [Urine:200] Intake/Output this shift: No intake/output data recorded.   Physical Exam:  General: Alert female in NAD Heart:  Regular rate and rhythm.  Pulmonary: Normal respiratory effort Abdomen: Soft, nondistended, nontender. Normal bowel sounds. Extremities: No lower extremity edema  Neurologic: Alert and oriented Psych: Pleasant. Cooperative     LOS: 4 days   Vina Dasen ,NP 03/16/2024, 11:39 AM  I have taken an interval history, thoroughly reviewed the chart and examined the patient. I agree with the Advanced Practitioner's note, impression and recommendations, and have recorded additional findings, impressions and recommendations below. I performed a substantive portion of this encounter (>50% time spent), including a complete performance of the medical decision making.  My additional thoughts are as follows:  Limited history-some abdominal pain, IDA, FOBT positive  Sodium improved after the treatment she has received  EGD and colonoscopy tomorrow.  Need for CTAP dependent on those findings  (High degree medical decision making-extra time necessary for use of interpreter services, endoscopic procedures discussed and planned) [name] [phone]

## 2024-03-16 NOTE — Plan of Care (Signed)

## 2024-03-16 NOTE — Progress Notes (Signed)
 [facility] night cross cover note:   I was notified by the patient's RN that the patient experienced an asymptomatic episode of hypoglycemia with CBG in the 50s, improving into the 70s with juice.  I subsequently added prn amps of D50 for ensuing cbg result less than 70.      Eva Pore, DO Hospitalist

## 2024-03-16 NOTE — H&P (View-Only) (Signed)
 Daily Progress Note  DOA: 03/12/2024 Hospital Day: 5   Cc: Severe iron deficiency anemia   Brief History:  83 y.o. year old female with a medical history including but not limited to CKD3, severe AS s/p RAVR, chronic hyponatremia. Admitted 7/5 by PCCM for abdominal pain, poor appetite. She has multiple metabolic derangements including severe acute on chronic hyponatremia. Found to have severe anemia with FOBT+. See 7/5 GI consult note   ASSESSMENT    83 yo [place] female ( non-English speaking) with severe iron deficiency anemia  FOBT+ and chronic abdominal pain  Presenting hgb 5.7, down ~ 3 grams compared to two years ago.  >>Today: Hgb stable at 8.9 . On oral iron so stool may turn black.  I was able to reach interpreter by phone today.  Patient gives a history of both intermittent black stools and generalized abdominal pain for years.  She cannot be any more descriptive.    Severe acute on chronic hyponatremia >>Today: Na up to 129   CKD3b Creatinine normal.  GFR 58   Principal Problem:   Acute hyponatremia Active Problems:   Severe aortic stenosis   Peripheral arterial disease (HCC)   CKD (chronic kidney disease), stage III (HCC)   S/P TAVR (transcatheter aortic valve replacement)   Hypertension   Symptomatic anemia   Abdominal pain   Iron deficiency anemia due to chronic blood loss   Heme positive stool   PLAN   --I was able to make contact with interpreter by phone (Risun Ksor).    The risks and benefits of EGD (with possible biopsies) and colonoscopy (with possible biopsies and removal of polyps) were discussed with the patient who agrees to proceed. -- Will plan for procedure to be done tomorrow.  Continue clear liquids today, will prep this evening.    Subjective   No complaints.  Through the interpreter I was able to determine that patient has a history of chronic generalized abdominal pain which she has had for many years.  She also gives a history of  chronic intermittent black stools.  No Pepto bismuth use in years.   Objective    Recent Labs    03/14/24 0413 03/15/24 0331 03/16/24 0220  WBC 10.5 14.1* 12.0*  HGB 8.2* 8.7* 8.9*  HCT 26.3* 27.5* 29.2*  MCV 73.7* 73.9* 74.1*  PLT 437* 488* 535*   Recent Labs    03/14/24 1210 03/15/24 0331 03/16/24 0220  NA 124* 127* 129*  K 5.1 5.0 4.6  CL 89* 93* 95*  CO2 25 26 25   GLUCOSE 209* 91 75  BUN 32* 36* 26*  CREATININE 1.29* 1.24* 0.97  CALCIUM 8.7* 8.6* 8.6*    Imaging:  DG Chest Portable 1 View CLINICAL DATA:  Shortness of breath  EXAM: PORTABLE CHEST 1 VIEW  COMPARISON:  08/16/2021  FINDINGS: Cardiomegaly, vascular congestion. Right basilar opacity. No confluent opacity on the left. No overt edema or effusions. No acute bony abnormality. Aortic atherosclerosis.  IMPRESSION: Cardiomegaly, vascular congestion.  Right basilar atelectasis or infiltrate.  Electronically Signed   By: Franky Crease M.D.   On: 03/12/2024 15:02    Scheduled inpatient medications:   Chlorhexidine  Gluconate Cloth  6 each Topical Daily   ferrous sulfate   325 mg Oral BID WC   gabapentin   100 mg Oral BID   hydrALAZINE   25 mg Oral Q8H   insulin  aspart  0-15 Units Subcutaneous Q4H   pantoprazole  (PROTONIX ) IV  40 mg Intravenous Q12H  polyethylene glycol  17 g Oral Daily   pravastatin   80 mg Oral QPM   Continuous inpatient infusions:  PRN inpatient medications: acetaminophen , docusate sodium , hydrOXYzine , ipratropium-albuterol , Muscle Rub, mouth rinse, oxyCODONE , polyethylene glycol  Vital signs in last 24 hours: Temp:  [97.6 F (36.4 C)-98.1 F (36.7 C)] 97.9 F (36.6 C) (07/09 0824) Pulse Rate:  [86-103] 98 (07/09 0824) Resp:  [18-20] 20 (07/09 0824) BP: (144-182)/(56-75) 167/75 (07/09 0824) SpO2:  [95 %-99 %] 95 % (07/09 0824) Weight:  [68.3 kg] 68.3 kg (07/09 0500) Last BM Date : 03/13/24  Intake/Output Summary (Last 24 hours) at 03/16/2024 1139 Last data filed at  03/15/2024 1500 Gross per 24 hour  Intake --  Output 200 ml  Net -200 ml    Intake/Output from previous day: 07/08 0701 - 07/09 0700 In: -  Out: 200 [Urine:200] Intake/Output this shift: No intake/output data recorded.   Physical Exam:  General: Alert female in NAD Heart:  Regular rate and rhythm.  Pulmonary: Normal respiratory effort Abdomen: Soft, nondistended, nontender. Normal bowel sounds. Extremities: No lower extremity edema  Neurologic: Alert and oriented Psych: Pleasant. Cooperative     LOS: 4 days   Vina Dasen ,NP 03/16/2024, 11:39 AM  I have taken an interval history, thoroughly reviewed the chart and examined the patient. I agree with the Advanced Practitioner's note, impression and recommendations, and have recorded additional findings, impressions and recommendations below. I performed a substantive portion of this encounter (>50% time spent), including a complete performance of the medical decision making.  My additional thoughts are as follows:  Limited history-some abdominal pain, IDA, FOBT positive  Sodium improved after the treatment she has received  EGD and colonoscopy tomorrow.  Need for CTAP dependent on those findings  (High degree medical decision making-extra time necessary for use of interpreter services, endoscopic procedures discussed and planned) [name] [phone]

## 2024-03-16 NOTE — Progress Notes (Signed)
  Echocardiogram 2D Echocardiogram has been attempted, pt not available. Will attempt later  Krystal Maddox Gosling RDCS 03/16/2024, 1:17 PM

## 2024-03-16 NOTE — Progress Notes (Signed)
 PROGRESS NOTE    Krystal Maddox  FMW:[id number] DOB: Aug 02, 1941 DOA: 03/12/2024 PCP: Cloria Annabella CROME, DO   Brief Narrative:  Krystal Maddox is a 83 y.o. female with past medical history significant for with past medical history significant for severe AS s/p TAVR, HTN, CAD, DM2, PVD, osteoporosis who presented to [facility] ED on 03/12/2024 with complaints of generalized abdominal pain, decreased appetite.  Patient reports onset of symptoms roughly 1 week prior. Nephrology and gastroenterology were consulted.  Patient was administered IV Lasix , IV fluids, Protonix  IV, insulin , dextrose  and Lokelma .  PCCM consulted for admission and patient was admitted to the intensive care unit given multiple severe electrolyte derangements, anemia.  Significant hospital events: 7/5: Admit to ICU, nephrology and GI consulted, transfuse 1 unit PRBC, Lasix  40 IV x 1, started on 3% saline infusion 7/6: Transfused 1 unit PRBC, Lasix  40 IV x1, 3% saline infusion discontinued 7/7: Lasix  40 IV x 1 7/8: Na 127 7/9: EGD/colonoscopy pending; echo pending  Assessment & Plan:   Principal Problem:   Acute hyponatremia Active Problems:   Severe aortic stenosis   Peripheral arterial disease (HCC)   CKD (chronic kidney disease), stage III (HCC)   S/P TAVR (transcatheter aortic valve replacement)   Hypertension   Symptomatic anemia   Abdominal pain   Iron deficiency anemia due to chronic blood loss   Heme positive stool   Acute on chronic hyponatremia Hypochloremia --Etiology likely multifactorial in the setting of hypervolemia and outpatient use of hydrochlorothiazide.  -- Sodium level on admission 109, chloride 74.  Started on 3% normal saline infusion and intermittent IV Lasix  with gradual improvement of sodium level. -- Nephrology following, appreciate assistance -echo pending -- HCTZ discontinued added to allergy list -- Na 109>106>>124>127 -- Continue) IV Lasix  per nephrology -- BMP daily   Hyperkalemia:  Resolved Patient presenting with a potassium of 6.0 in the setting of losartan  use outpatient.  Patient administered insulin , dextrose  and Lokelma  in the ED. -- K 6.0>>5.1>5.0 -- BMP in am   Symptomatic iron deficiency anemia Concern for chronic GI bleed Patient presenting with a hemoglobin of 5.7, MCV 67.7.  FOBT positive.  Anemia panel with  iron 9, TIBC 447, ferritin 46, folate 18.1, vitamin B12 2263 -- [facility] following, appreciate assistance -- Transfused total of 2 unit PRBCs during his hospitalization, last on 7/6 -- Hgb 5.7>7.0>6.8>8.3>8.2>8.7>8.9 -- Protonix  40 mg IV every 12 hours -- Ferrous sulfate  325 mg p.o. twice daily -- GI planning EGD/colonoscopy -- CBC daily   HTN Home regimen includes amlodipine  10 mg p.o. daily, losartan -HCTZ 100-25 mg p.o. daily, furosemide  20 mg p.o. daily -- HCTZ discontinued and added to allergy list per nephrology -- Holding home losartan , amlodipine  -- Hydralazine  25 mg p.o. every 8 hours  -- Receiving intermittent IV Lasix  per nephrology -- Continue to monitor BP closely   DM2 On glipizide 5 mg p.o. twice daily, Tradjenta 5 mg p.o. daily, and NovoLog  at baseline.  Hemoglobin A1c 6.4 on 03/12/2024, well-controlled. -- Moderate SSI for coverage -- CBG before every meal/at bedtime   HLD -- Pravastatin  80 mg p.o. daily    GERD -- Continue PPI  DVT prophylaxis: SCDs Start: 03/12/24 1642 Code Status:   Code Status: Full Code Family Communication: None present  Status is: Inpatient  Dispo: The patient is from: Home              Anticipated d/c is to: Home  Anticipated d/c date is: 24 to 48 hours              Patient currently not medically stable for discharge  Consultants:  GI, nephrology  Procedures:  Upper lower endoscopy, echo  Antimicrobials:  None  Subjective: No acute issues or events overnight  Objective: Vitals:   03/15/24 2044 03/16/24 0002 03/16/24 0500 03/16/24 0504  BP: (!) 182/65 (!) 167/62   (!) 149/66  Pulse: (!) 103 95    Resp:      Temp: 98.1 F (36.7 C) 97.7 F (36.5 C)    TempSrc: Oral Oral    SpO2: 98% 98%    Weight:   68.3 kg   Height:        Intake/Output Summary (Last 24 hours) at 03/16/2024 0743 Last data filed at 03/15/2024 1500 Gross per 24 hour  Intake --  Output 200 ml  Net -200 ml   Filed Weights   03/14/24 2000 03/15/24 0701 03/16/24 0500  Weight: 70.2 kg 69.2 kg 68.3 kg    Examination:  General:  Pleasantly resting in bed, No acute distress. HEENT:  Normocephalic atraumatic.  Sclerae nonicteric, noninjected.  Extraocular movements intact bilaterally. Neck:  Without mass or deformity.  Trachea is midline. Lungs:  Clear to auscultate bilaterally without rhonchi, wheeze, or rales. Heart:  Regular rate and rhythm.  Without murmurs, rubs, or gallops. Abdomen:  Soft, nontender, nondistended.  Without guarding or rebound. Extremities: Without cyanosis, clubbing, edema, or obvious deformity. Skin:  Warm and dry, no erythema.  Data Reviewed: I have personally reviewed following labs and imaging studies  CBC: Recent Labs  Lab 03/12/24 1346 03/12/24 2312 03/13/24 0418 03/13/24 1349 03/14/24 0413 03/15/24 0331 03/16/24 0220  WBC 13.3* 13.6* 13.0*  --  10.5 14.1* 12.0*  NEUTROABS 10.7*  --   --   --   --   --   --   HGB 5.7* 7.0* 6.8* 8.3* 8.2* 8.7* 8.9*  HCT 18.0* 20.6* 20.9* 24.3* 26.3* 27.5* 29.2*  MCV 67.7* 68.9* 69.2*  --  73.7* 73.9* 74.1*  PLT 366 378 388  --  437* 488* 535*   Basic Metabolic Panel: Recent Labs  Lab 03/12/24 2016 03/12/24 2312 03/13/24 0418 03/13/24 1038 03/13/24 2300 03/14/24 0413 03/14/24 1210 03/15/24 0331 03/16/24 0220  NA 110*  109* 111* 114*   < > 122* 123* 124* 127* 129*  K 5.0 4.7 4.8  --   --  5.9* 5.1 5.0 4.6  CL 76* 77* 80*  --   --  90* 89* 93* 95*  CO2 22 23 23   --   --  23 25 26 25   GLUCOSE 127* 116* 92  --   --  135* 209* 91 75  BUN 36* 36* 33*  --   --  31* 32* 36* 26*  CREATININE 1.12* 1.15*  1.18*  --   --  1.20* 1.29* 1.24* 0.97  CALCIUM 8.3* 8.3* 8.4*  --   --  8.5* 8.7* 8.6* 8.6*  MG 2.7* 2.4 2.5*  --   --  2.3  --   --   --   PHOS  --  4.5 5.3*  --   --  5.0*  --  3.9  --    < > = values in this interval not displayed.   GFR: Estimated Creatinine Clearance: 38.2 mL/min (by C-G formula based on SCr of 0.97 mg/dL). Liver Function Tests: Recent Labs  Lab 03/12/24 1346  AST 33  ALT 31  ALKPHOS 115  BILITOT 0.5  PROT 7.5  ALBUMIN 2.7*   Recent Labs  Lab 03/12/24 1346  LIPASE 50   Coagulation Profile: Recent Labs  Lab 03/12/24 1428  INR 1.0   CBG: Recent Labs  Lab 03/15/24 1547 03/15/24 2042 03/15/24 2358 03/15/24 2359 03/16/24 0503  GLUCAP 124* 208* 65* 71 99    Recent Results (from the past 240 hours)  MRSA Next Gen by PCR, Nasal     Status: None   Collection Time: 03/12/24  5:44 PM   Specimen: Nasal Mucosa; Nasal Swab  Result Value Ref Range Status   MRSA by PCR Next Gen NOT DETECTED NOT DETECTED Final    Comment: (NOTE) The GeneXpert MRSA Assay (FDA approved for NASAL specimens only), is one component of a comprehensive MRSA colonization surveillance program. It is not intended to diagnose MRSA infection nor to guide or monitor treatment for MRSA infections. Test performance is not FDA approved in patients less than 30 years old. Performed at [facility], [address], [place], [place] [address]          Radiology Studies: No results found.      Scheduled Meds:  Chlorhexidine  Gluconate Cloth  6 each Topical Daily   ferrous sulfate   325 mg Oral BID WC   gabapentin   100 mg Oral BID   hydrALAZINE   25 mg Oral Q8H   insulin  aspart  0-15 Units Subcutaneous Q4H   pantoprazole  (PROTONIX ) IV  40 mg Intravenous Q12H   polyethylene glycol  17 g Oral Daily   pravastatin   80 mg Oral QPM   Continuous Infusions:   LOS: 4 days   Time spent:  Elsie JAYSON Montclair, DO [facility]  If 7PM-7AM, please contact  night-coverage [email or web address]  03/16/2024, 7:43 AM

## 2024-03-17 ENCOUNTER — Encounter (HOSPITAL_COMMUNITY): Payer: Self-pay | Admitting: Pulmonary Disease

## 2024-03-17 ENCOUNTER — Inpatient Hospital Stay (HOSPITAL_COMMUNITY): Payer: Medicare (Managed Care)

## 2024-03-17 ENCOUNTER — Encounter (HOSPITAL_COMMUNITY)
Admission: EM | Disposition: A | Discharge status: Home Health | Payer: Self-pay | Source: Home / Self Care | Attending: Internal Medicine

## 2024-03-17 ENCOUNTER — Inpatient Hospital Stay (HOSPITAL_COMMUNITY): Payer: Medicare (Managed Care) | Admitting: Anesthesiology

## 2024-03-17 DIAGNOSIS — D122 Benign neoplasm of ascending colon: Secondary | ICD-10-CM | POA: Diagnosis not present

## 2024-03-17 DIAGNOSIS — N1831 Chronic kidney disease, stage 3a: Secondary | ICD-10-CM | POA: Diagnosis not present

## 2024-03-17 DIAGNOSIS — I129 Hypertensive chronic kidney disease with stage 1 through stage 4 chronic kidney disease, or unspecified chronic kidney disease: Secondary | ICD-10-CM | POA: Diagnosis not present

## 2024-03-17 DIAGNOSIS — K2950 Unspecified chronic gastritis without bleeding: Secondary | ICD-10-CM

## 2024-03-17 DIAGNOSIS — K5521 Angiodysplasia of colon with hemorrhage: Secondary | ICD-10-CM

## 2024-03-17 DIAGNOSIS — K2970 Gastritis, unspecified, without bleeding: Secondary | ICD-10-CM | POA: Diagnosis not present

## 2024-03-17 DIAGNOSIS — Q438 Other specified congenital malformations of intestine: Secondary | ICD-10-CM

## 2024-03-17 DIAGNOSIS — E871 Hypo-osmolality and hyponatremia: Secondary | ICD-10-CM | POA: Diagnosis not present

## 2024-03-17 DIAGNOSIS — D124 Benign neoplasm of descending colon: Secondary | ICD-10-CM

## 2024-03-17 DIAGNOSIS — K6289 Other specified diseases of anus and rectum: Secondary | ICD-10-CM

## 2024-03-17 DIAGNOSIS — K2920 Alcoholic gastritis without bleeding: Secondary | ICD-10-CM

## 2024-03-17 HISTORY — PX: COLONOSCOPY: SHX5424

## 2024-03-17 HISTORY — PX: ESOPHAGOGASTRODUODENOSCOPY: SHX5428

## 2024-03-17 LAB — GLUCOSE, CAPILLARY
Glucose-Capillary: 126 mg/dL — ABNORMAL HIGH (ref 70–99)
Glucose-Capillary: 109 mg/dL — ABNORMAL HIGH (ref 70–99)
Glucose-Capillary: 125 mg/dL — ABNORMAL HIGH (ref 70–99)
Glucose-Capillary: 109 mg/dL — ABNORMAL HIGH (ref 70–99)
Glucose-Capillary: 139 mg/dL — ABNORMAL HIGH (ref 70–99)
Glucose-Capillary: 138 mg/dL — ABNORMAL HIGH (ref 70–99)

## 2024-03-17 LAB — RENAL FUNCTION PANEL
Albumin: 2.7 g/dL — ABNORMAL LOW (ref 3.5–5.0)
Anion gap: 11 (ref 5–15)
BUN: 17 mg/dL (ref 8–23)
CO2: 24 mmol/L (ref 22–32)
Calcium: 9.1 mg/dL (ref 8.9–10.3)
Chloride: 103 mmol/L (ref 98–111)
Creatinine, Ser: 1.06 mg/dL — ABNORMAL HIGH (ref 0.44–1.00)
GFR, Estimated: 52 mL/min — ABNORMAL LOW (ref >60)
Glucose, Bld: 118 mg/dL — ABNORMAL HIGH (ref 70–99)
Phosphorus: 4.7 mg/dL — ABNORMAL HIGH (ref 2.5–4.6)
Potassium: 4.5 mmol/L (ref 3.5–5.1)
Sodium: 138 mmol/L (ref 135–145)

## 2024-03-17 LAB — CBC
HCT: 30 % — ABNORMAL LOW (ref 36.0–46.0)
Hemoglobin: 9.2 g/dL — ABNORMAL LOW (ref 12.0–15.0)
MCH: 22.8 pg — ABNORMAL LOW (ref 26.0–34.0)
MCHC: 30.7 g/dL (ref 30.0–36.0)
MCV: 74.4 fL — ABNORMAL LOW (ref 80.0–100.0)
Platelets: 533 K/uL — ABNORMAL HIGH (ref 150–400)
RBC: 4.03 MIL/uL (ref 3.87–5.11)
RDW: 20.4 % — ABNORMAL HIGH (ref 11.5–15.5)
WBC: 9.7 K/uL (ref 4.0–10.5)
nRBC: 0 % (ref 0.0–0.2)

## 2024-03-17 SURGERY — COLONOSCOPY
Anesthesia: Monitor Anesthesia Care

## 2024-03-17 MED ORDER — STERILE WATER FOR INJECTION IJ SOLN
INTRAMUSCULAR | Status: AC
  Administered 2024-03-17: 10 mL
  Filled 2024-03-17: qty 10

## 2024-03-17 MED ORDER — PROPOFOL 500 MG/50ML IV EMUL
INTRAVENOUS | Status: DC | PRN
  Administered 2024-03-17: 100 ug/kg/min via INTRAVENOUS

## 2024-03-17 MED ORDER — SODIUM CHLORIDE 0.9 % IV SOLN: INTRAVENOUS | Status: DC | PRN

## 2024-03-17 MED ORDER — IOHEXOL 350 MG/ML SOLN
60.0000 mL | Freq: Once | INTRAVENOUS | Status: AC | PRN
  Administered 2024-03-17: 60 mL via INTRAVENOUS

## 2024-03-17 MED ORDER — PROPOFOL 10 MG/ML IV BOLUS
INTRAVENOUS | Status: DC | PRN
  Administered 2024-03-17: 15 mg via INTRAVENOUS
  Administered 2024-03-17: 50 mg via INTRAVENOUS

## 2024-03-17 MED ORDER — PHENYLEPHRINE HCL-NACL 20-0.9 MG/250ML-% IV SOLN
INTRAVENOUS | Status: DC | PRN
  Administered 2024-03-17: 20 ug/min via INTRAVENOUS

## 2024-03-17 NOTE — Transfer of Care (Signed)
 Immediate Anesthesia Transfer of Care Note  Patient: Krystal Maddox  Procedure(s) Performed: COLONOSCOPY EGD (ESOPHAGOGASTRODUODENOSCOPY)  Patient Location: PACU  Anesthesia Type:MAC  Level of Consciousness: awake, alert , and oriented  Airway & Oxygen Therapy: Patient Spontanous Breathing  Post-op Assessment: Report given to RN, Post -op Vital signs reviewed and stable, and Patient moving all extremities X 4  Post vital signs: Reviewed and stable  Last Vitals:  Vitals Value Taken Time  BP 139/76 03/17/24 12:56  Temp    Pulse 86 03/17/24 13:00  Resp 19 03/17/24 13:00  SpO2 95 % 03/17/24 13:00  Vitals shown include unfiled device data.  Last Pain:  Vitals:   03/17/24 1138  TempSrc: Temporal  PainSc:       Patients Stated Pain Goal: 3 (03/13/24 1200)  Complications: No notable events documented.

## 2024-03-17 NOTE — Anesthesia Preprocedure Evaluation (Addendum)
 Anesthesia Evaluation  Patient identified by MRN, date of birth, ID band Patient awake    Reviewed: Allergy & Precautions, H&P , NPO status , Patient's Chart, lab work & pertinent test results  Airway Mallampati: III  TM Distance: >3 FB Neck ROM: Full    Dental no notable dental hx. (+) Edentulous Upper, Partial Lower, Dental Advisory Given   Pulmonary neg pulmonary ROS   Pulmonary exam normal breath sounds clear to auscultation       Cardiovascular hypertension, Pt. on medications + Peripheral Vascular Disease  + Valvular Problems/Murmurs MR  Rhythm:Regular Rate:Normal  S/p TAVR   Neuro/Psych negative neurological ROS  negative psych ROS   GI/Hepatic negative GI ROS, Neg liver ROS,,,  Endo/Other  negative endocrine ROS    Renal/GU Renal InsufficiencyRenal disease  negative genitourinary   Musculoskeletal  (+) Arthritis ,    Abdominal   Peds  Hematology  (+) Blood dyscrasia, anemia   Anesthesia Other Findings   Reproductive/Obstetrics negative OB ROS                              Anesthesia Physical Anesthesia Plan  ASA: 3  Anesthesia Plan: MAC   Post-op Pain Management: Minimal or no pain anticipated   Induction: Intravenous  PONV Risk Score and Plan: 2 and Propofol  infusion and Treatment may vary due to age or medical condition  Airway Management Planned: Natural Airway and Simple Face Mask  Additional Equipment:   Intra-op Plan:   Post-operative Plan:   Informed Consent: I have reviewed the patients History and Physical, chart, labs and discussed the procedure including the risks, benefits and alternatives for the proposed anesthesia with the patient or authorized representative who has indicated his/her understanding and acceptance.     Dental advisory given and Interpreter used for interview  Plan Discussed with: CRNA  Anesthesia Plan Comments:           Anesthesia Quick Evaluation

## 2024-03-17 NOTE — Plan of Care (Signed)

## 2024-03-17 NOTE — Progress Notes (Signed)
 PROGRESS NOTE    Krystal Maddox  FMW:[id number] DOB: 07/26/1941 DOA: 03/12/2024 PCP: Cloria Annabella CROME, DO   Brief Narrative:  Krystal Maddox is a 83 y.o. female with past medical history significant for with past medical history significant for severe AS s/p TAVR, HTN, CAD, DM2, PVD, osteoporosis who presented to [facility] ED on 03/12/2024 with complaints of generalized abdominal pain, decreased appetite.  Patient reports onset of symptoms roughly 1 week prior. Nephrology and gastroenterology were consulted.  Patient was administered IV Lasix , IV fluids, Protonix  IV, insulin , dextrose  and Lokelma .  PCCM consulted for admission and patient was admitted to the intensive care unit given multiple severe electrolyte derangements, need for 3% saline, and symptomatic anemia.  Significant hospital events: 7/5: Admit to ICU, nephrology and GI consulted, transfuse 1 unit PRBC, Lasix  40 IV x 1, started on 3% saline infusion 7/6: Transfused 1 unit PRBC, Lasix  40 IV x1, 3% saline infusion discontinued 7/7: Lasix  40 IV x 1 7/8: Na 127 7/9: Echo shows mod/severe MR - cardiology to set up for outpatient evaluation 7/10: EGD/colonoscopy pending  Assessment & Plan:   Principal Problem:   Acute hyponatremia Active Problems:   Severe aortic stenosis   Peripheral arterial disease (HCC)   CKD (chronic kidney disease), stage III (HCC)   S/P TAVR (transcatheter aortic valve replacement)   Hypertension   Symptomatic anemia   Abdominal pain   Iron deficiency anemia due to chronic blood loss   Heme positive stool   Acute on chronic hyponatremia Hypochloremia --Etiology likely multifactorial in the setting of hypervolemia and outpatient use of hydrochlorothiazide.  -- Sodium level on admission 109, chloride 74.  Started on 3% normal saline infusion and intermittent IV Lasix  with gradual improvement of sodium level. -- Nephrology following, appreciate assistance -echo pending -- HCTZ discontinued added to allergy  list -- Na 109 at intake - currently 129 -- Continue IV Lasix  per nephrology -- BMP daily   Hyperkalemia: Resolved Patient presenting with a potassium of 6.0 in the setting of losartan  use outpatient.  Patient administered insulin , dextrose  and Lokelma  in the ED. -- K 6.0>>5.1>5.0 -- BMP in am   Symptomatic iron deficiency anemia Rule out concurrent acute on chronic GI bleed Patient presenting with a hemoglobin of 5.7, MCV 67.7.  FOBT positive.  Anemia panel with  iron 9, TIBC 447, ferritin 46, folate 18.1, vitamin B12 2263 -- [facility] following, appreciate assistance -- Transfused total of 2 unit PRBCs during his hospitalization, last on 7/6 -- Hgb 5.7>7.0>6.8>8.3>8.2>8.7>8.9 -- Protonix  40 mg IV every 12 hours -- Ferrous sulfate  325 mg p.o. twice daily -- GI planning EGD/colonoscopy -- CBC daily   Moderate to Severe mitral regurgitation TAVR history -Discussed with cardiology -Outpatient follow up planned for further evaluation, no indication for further imaging or workup at this time  HTN Home regimen includes amlodipine  10 mg p.o. daily, losartan -HCTZ 100-25 mg p.o. daily, furosemide  20 mg p.o. daily -- HCTZ discontinued and added to allergy list per nephrology -- Holding home losartan , amlodipine  -- Hydralazine  25 mg p.o. every 8 hours  -- Receiving intermittent IV Lasix  per nephrology -- Continue to monitor BP closely   DM2 On glipizide 5 mg p.o. twice daily, Tradjenta 5 mg p.o. daily, and NovoLog  at baseline.  Hemoglobin A1c 6.4 on 03/12/2024, well-controlled. -- Moderate SSI for coverage -- CBG before every meal/at bedtime   HLD -- Pravastatin  80 mg p.o. daily    GERD -- Continue PPI  DVT prophylaxis: SCDs Start: 03/12/24 1642 Code  Status:   Code Status: Full Code Family Communication: None present  Status is: Inpatient  Dispo: The patient is from: Home              Anticipated d/c is to: Home              Anticipated d/c date is: 24 to 48 hours               Patient currently not medically stable for discharge  Consultants:  GI, nephrology, PCCM  Procedures:  Upper lower endoscopy, echo  Antimicrobials:  None  Subjective: No acute issues or events overnight  Objective: Vitals:   03/16/24 2332 03/17/24 0004 03/17/24 0413 03/17/24 0500  BP: (!) 166/60 (!) 173/72 (!) 151/57   Pulse: 94 97 92   Resp: 18  19   Temp: 97.6 F (36.4 C)  (!) 97.4 F (36.3 C)   TempSrc: Oral     SpO2: 97%  98%   Weight:    66.1 kg  Height:       No intake or output data in the 24 hours ending 03/17/24 0757  Filed Weights   03/15/24 0701 03/16/24 0500 03/17/24 0500  Weight: 69.2 kg 68.3 kg 66.1 kg    Examination:  General:  Pleasantly resting in bed, No acute distress. HEENT:  Normocephalic atraumatic.  Sclerae nonicteric, noninjected.  Extraocular movements intact bilaterally. Neck:  Without mass or deformity.  Trachea is midline. Lungs:  Clear to auscultate bilaterally without rhonchi, wheeze, or rales. Heart:  Regular rate and rhythm.  Without murmurs, rubs, or gallops. Abdomen:  Soft, nontender, nondistended.  Without guarding or rebound. Extremities: Without cyanosis, clubbing, edema, or obvious deformity. Skin:  Warm and dry, no erythema.  Data Reviewed: I have personally reviewed following labs and imaging studies  CBC: Recent Labs  Lab 03/12/24 1346 03/12/24 2312 03/13/24 0418 03/13/24 1349 03/14/24 0413 03/15/24 0331 03/16/24 0220  WBC 13.3* 13.6* 13.0*  --  10.5 14.1* 12.0*  NEUTROABS 10.7*  --   --   --   --   --   --   HGB 5.7* 7.0* 6.8* 8.3* 8.2* 8.7* 8.9*  HCT 18.0* 20.6* 20.9* 24.3* 26.3* 27.5* 29.2*  MCV 67.7* 68.9* 69.2*  --  73.7* 73.9* 74.1*  PLT 366 378 388  --  437* 488* 535*   Basic Metabolic Panel: Recent Labs  Lab 03/12/24 2016 03/12/24 2312 03/13/24 0418 03/13/24 1038 03/13/24 2300 03/14/24 0413 03/14/24 1210 03/15/24 0331 03/16/24 0220  NA 110*  109* 111* 114*   < > 122* 123* 124* 127* 129*   K 5.0 4.7 4.8  --   --  5.9* 5.1 5.0 4.6  CL 76* 77* 80*  --   --  90* 89* 93* 95*  CO2 22 23 23   --   --  23 25 26 25   GLUCOSE 127* 116* 92  --   --  135* 209* 91 75  BUN 36* 36* 33*  --   --  31* 32* 36* 26*  CREATININE 1.12* 1.15* 1.18*  --   --  1.20* 1.29* 1.24* 0.97  CALCIUM 8.3* 8.3* 8.4*  --   --  8.5* 8.7* 8.6* 8.6*  MG 2.7* 2.4 2.5*  --   --  2.3  --   --   --   PHOS  --  4.5 5.3*  --   --  5.0*  --  3.9  --    < > = values in this  interval not displayed.   GFR: Estimated Creatinine Clearance: 37.6 mL/min (by C-G formula based on SCr of 0.97 mg/dL). Liver Function Tests: Recent Labs  Lab 03/12/24 1346  AST 33  ALT 31  ALKPHOS 115  BILITOT 0.5  PROT 7.5  ALBUMIN 2.7*   Recent Labs  Lab 03/12/24 1346  LIPASE 50   Coagulation Profile: Recent Labs  Lab 03/12/24 1428  INR 1.0   CBG: Recent Labs  Lab 03/16/24 1959 03/16/24 2036 03/16/24 2126 03/16/24 2332 03/17/24 0414  GLUCAP 53* 67* 74 72 126*    Recent Results (from the past 240 hours)  MRSA Next Gen by PCR, Nasal     Status: None   Collection Time: 03/12/24  5:44 PM   Specimen: Nasal Mucosa; Nasal Swab  Result Value Ref Range Status   MRSA by PCR Next Gen NOT DETECTED NOT DETECTED Final    Comment: (NOTE) The GeneXpert MRSA Assay (FDA approved for NASAL specimens only), is one component of a comprehensive MRSA colonization surveillance program. It is not intended to diagnose MRSA infection nor to guide or monitor treatment for MRSA infections. Test performance is not FDA approved in patients less than 3 years old. Performed at [facility], [address], [place], [place] [address]          Radiology Studies: ECHOCARDIOGRAM COMPLETE Result Date: 03/16/2024    ECHOCARDIOGRAM REPORT   Patient Name:   [facility] Date of Exam: 03/16/2024 Medical Rec #:  [id number]     Height:       61.0 in Accession #:    [id number]    Weight:       150.6 lb Date of Birth:  [birth year]      BSA:           1.674 m Patient Age:    83 years      BP:           160/68 mmHg Patient Gender: F             HR:           95 bpm. Exam Location:  Inpatient Procedure: 2D Echo, Color Doppler and Cardiac Doppler (Both Spectral and Color            Flow Doppler were utilized during procedure). Indications:    CHF I50.9  History:        Patient has prior history of Echocardiogram examinations, most                 recent 09/12/2022.                 Aortic Valve: 26 mm Medtronic stented TAVR valve is present in                 the aortic position. Procedure Date: 08/13/21.  Sonographer:    Tinnie Gosling RDCS Referring Phys: [phone] LINDSAY A KRUSKA IMPRESSIONS  1. S/P TAVR with mean gradient 7 mmHg and no AI; moderate to severe MR (new compared to previous).  2. Left ventricular ejection fraction, by estimation, is 60 to 65%. The left ventricle has normal function. The left ventricle has no regional wall motion abnormalities. Left ventricular diastolic parameters are indeterminate.  3. Right ventricular systolic function is normal. The right ventricular size is normal.  4. The mitral valve is normal in structure. Moderate to severe mitral valve regurgitation. No evidence of mitral stenosis.  5. The aortic valve has been repaired/replaced. Aortic valve regurgitation  is not visualized. No aortic stenosis is present. There is a 26 mm Medtronic stented TAVR valve present in the aortic position. Procedure Date: 08/13/21.  6. The inferior vena cava is normal in size with greater than 50% respiratory variability, suggesting right atrial pressure of 3 mmHg. FINDINGS  Left Ventricle: Left ventricular ejection fraction, by estimation, is 60 to 65%. The left ventricle has normal function. The left ventricle has no regional wall motion abnormalities. The left ventricular internal cavity size was normal in size. There is  no left ventricular hypertrophy. Left ventricular diastolic parameters are indeterminate. Right Ventricle: The right ventricular  size is normal. Right ventricular systolic function is normal. Left Atrium: Left atrial size was normal in size. Right Atrium: Right atrial size was normal in size. Pericardium: There is no evidence of pericardial effusion. Mitral Valve: The mitral valve is normal in structure. Mild mitral annular calcification. Moderate to severe mitral valve regurgitation. No evidence of mitral valve stenosis. Tricuspid Valve: The tricuspid valve is normal in structure. Tricuspid valve regurgitation is mild . No evidence of tricuspid stenosis. Aortic Valve: The aortic valve has been repaired/replaced. Aortic valve regurgitation is not visualized. No aortic stenosis is present. There is a 26 mm Medtronic stented TAVR valve present in the aortic position. Procedure Date: 08/13/21. Pulmonic Valve: The pulmonic valve was normal in structure. Pulmonic valve regurgitation is trivial. No evidence of pulmonic stenosis. Aorta: The aortic root is normal in size and structure. Venous: The inferior vena cava is normal in size with greater than 50% respiratory variability, suggesting right atrial pressure of 3 mmHg. IAS/Shunts: No atrial level shunt detected by color flow Doppler. Additional Comments: S/P TAVR with mean gradient 7 mmHg and no AI; moderate to severe MR (new compared to previous).  LEFT VENTRICLE PLAX 2D LVIDd:         4.60 cm LVIDs:         3.10 cm LV PW:         0.90 cm LV IVS:        0.90 cm LVOT diam:     1.90 cm LV SV:         54 LV SV Index:   32 LVOT Area:     2.84 cm  RIGHT VENTRICLE             IVC RV S prime:     11.20 cm/s  IVC diam: 1.40 cm TAPSE (M-mode): 2.0 cm LEFT ATRIUM           Index        RIGHT ATRIUM           Index LA diam:      4.60 cm 2.75 cm/m   RA Area:     15.20 cm LA Vol (A2C): 49.7 ml 29.69 ml/m  RA Volume:   38.10 ml  22.76 ml/m LA Vol (A4C): 42.5 ml 25.39 ml/m  AORTIC VALVE LVOT Vmax:   100.00 cm/s LVOT Vmean:  66.500 cm/s LVOT VTI:    0.191 m  AORTA Ao Root diam: 2.60 cm Ao Asc diam:  3.20  cm  SHUNTS Systemic VTI:  0.19 m Systemic Diam: 1.90 cm Redell Shallow MD Electronically signed by Redell Shallow MD Signature Date/Time: 03/16/2024/2:57:29 PM    Final         Scheduled Meds:  Chlorhexidine  Gluconate Cloth  6 each Topical Daily   ferrous sulfate   325 mg Oral BID WC   gabapentin   100 mg Oral BID  hydrALAZINE   25 mg Oral Q8H   insulin  aspart  0-15 Units Subcutaneous Q4H   pantoprazole  (PROTONIX ) IV  40 mg Intravenous Q12H   polyethylene glycol  17 g Oral Daily   pravastatin   80 mg Oral QPM   Continuous Infusions:   LOS: 5 days   Time spent:  Elsie JAYSON Montclair, DO [facility]  If 7PM-7AM, please contact night-coverage [email or web address]  03/17/2024, 7:57 AM

## 2024-03-17 NOTE — Interval H&P Note (Signed)
 History and Physical Interval Note:  03/17/2024 11:52 AM  Krystal Maddox  has presented today for surgery, with the diagnosis of Iron deficiency anemia.  The various methods of treatment have been discussed with the patient and family. After consideration of risks, benefits and other options for treatment, the patient has consented to  Procedure(s): COLONOSCOPY (N/A) EGD (ESOPHAGOGASTRODUODENOSCOPY) (N/A) as a surgical intervention.  The patient's history has been reviewed, patient examined, no change in status, stable for surgery.  I have reviewed the patient's chart and labs.  Questions were answered to the patient's satisfaction.    Patient seen in endoscopy preprocedure area with interpreter.  Sodium 129 yesterday, Hgb 9.2 today [name]

## 2024-03-17 NOTE — Op Note (Signed)
 [facility] Patient Name: Krystal Maddox Procedure Date : 03/17/2024 MRN: [id number] Attending MD: Victory CROME. Legrand , MD, [id number] Date of Birth: 22-Sep-1940 CSN: [id number] Age: 83 Admit Type: Inpatient Procedure:                Colonoscopy Indications:              Upper abdominal pain, Unexplained iron deficiency                            anemia Providers:                Victory L. Legrand, MD, Randall Lines, RN, Coye Bade, Technician Referring MD:              Medicines:                Monitored Anesthesia Care Complications:            No immediate complications. Estimated Blood Loss:     Estimated blood loss was minimal. Procedure:                Pre-Anesthesia Assessment:                           - Prior to the procedure, a History and Physical                            was performed, and patient medications and                            allergies were reviewed. The patient's tolerance of                            previous anesthesia was also reviewed. The risks                            and benefits of the procedure and the sedation                            options and risks were discussed with the patient.                            All questions were answered, and informed consent                            was obtained. Prior Anticoagulants: The patient has                            taken no anticoagulant or antiplatelet agents. ASA                            Grade Assessment: IV - A patient with severe  systemic disease that is a constant threat to life.                            After reviewing the risks and benefits, the patient                            was deemed in satisfactory condition to undergo the                            procedure.                           - Prior to the procedure, a History and Physical                            was performed, and patient medications and                             allergies were reviewed. The patient's tolerance of                            previous anesthesia was also reviewed. The risks                            and benefits of the procedure and the sedation                            options and risks were discussed with the patient.                            All questions were answered, and informed consent                            was obtained. Prior Anticoagulants: The patient has                            taken no anticoagulant or antiplatelet agents. ASA                            Grade Assessment: IV - A patient with severe                            systemic disease that is a constant threat to life.                            After reviewing the risks and benefits, the patient                            was deemed in satisfactory condition to undergo the                            procedure.  After obtaining informed consent, the colonoscope                            was passed under direct vision. Throughout the                            procedure, the patient's blood pressure, pulse, and                            oxygen saturations were monitored continuously. The                            PCF-HQ190L ([id number] Olympus colonoscope was                            introduced through the anus and advanced to the the                            terminal ileum, with identification of the                            appendiceal orifice and IC valve. The colonoscopy                            was somewhat difficult due to a redundant colon.                            Successful completion of the procedure was aided by                            using manual pressure and straightening and                            shortening the scope to obtain bowel loop                            reduction. The patient tolerated the procedure                            well. The quality of the bowel preparation  was                            fair. The terminal ileum, ileocecal valve,                            appendiceal orifice, and rectum were photographed. Scope In: 12:16:04 PM Scope Out: 12:47:17 PM Scope Withdrawal Time: 0 hours 26 minutes 51 seconds  Total Procedure Duration: 0 hours 31 minutes 13 seconds  Findings:      The digital rectal exam findings include decreased sphincter tone.      The terminal ileum appeared normal.      A single small angiodysplastic lesion with bleeding on contact was found       in the cecum. Coagulation for hemostasis using argon plasma was  successful. To prevent bleeding post-intervention, two hemostatic clips       were successfully placed (MR conditional).      Two sessile polyps were found in the descending colon and ascending       colon. The polyps were diminutive in size. These polyps were removed       with a cold snare. Resection and retrieval were complete.      Three pedunculated polyps were found in the descending colon and       ascending colon. The polyps were 6 to 10 mm in size. These polyps were       removed with a hot snare. Resection and retrieval were complete.      Retroflexion in the rectum was not performed due to anatomy.      The exam was otherwise without abnormality. Impression:               - Preparation of the colon was fair.                           - Decreased sphincter tone found on digital rectal                            exam.                           - The examined portion of the ileum was normal.                           - A single colonic angiodysplastic lesion. Treated                            with argon plasma coagulation (APC). Clips (MR                            conditional) were placed.                           - Two diminutive polyps in the descending colon and                            in the ascending colon, removed with a cold snare.                            Resected and retrieved.                            - Three 6 to 10 mm polyps in the descending colon                            and in the ascending colon, removed with a hot                            snare. Resected and retrieved.                           - The examination was otherwise normal. Recommendation:           -  Return patient to hospital ward for ongoing care.                           - Resume regular diet.                           - Monitor hemoglobin and iron levels and supplement                            with iron as needed                           Follow-up biopsies from upper endoscopy.                           Follow-up polyp pathology, though polyps appear                            benign and patient will not need any future                            surveillance colonoscopies at her age                           See initial GI consult for consideration of CT                            abdomen and pelvis to evaluate patient's abdominal                            pain that is difficult to characterize.                           Since it is not clear if the upper endoscopic                            findings explain her abdominal pain (and biopsy                            still pending) or if the EGD and colonoscopy                            findings fully explain her acute on chronic iron                            deficiency anemia, I recommend proceeding with that                            CT abdomen pelvis with oral and IV contrast if her                            renal function is felt to have recovered  sufficiently for the use of IV contrast.                           Findings conveyed to patient and family through                            interpreter at bedside                           Inpatient GI service signing off-please reconsult                            as necessary, particularly if significant pertinent                            findings on CT  abdomen and pelvis Procedure Code(s):        --- Professional ---                           [phone], 59, Colonoscopy, flexible; with control of                            bleeding, any method                           45385, Colonoscopy, flexible; with removal of                            tumor(s), polyp(s), or other lesion(s) by snare                            technique Diagnosis Code(s):        --- Professional ---                           K62.89, Other specified diseases of anus and rectum                           K55.20, Angiodysplasia of colon without hemorrhage                           D12.4, Benign neoplasm of descending colon                           D12.2, Benign neoplasm of ascending colon                           R10.10, Upper abdominal pain, unspecified                           D50.9, Iron deficiency anemia, unspecified CPT copyright 2022 American Medical Association. All rights reserved. The codes documented in this report are preliminary and upon coder review may  be revised to meet current compliance requirements. Cristin Penaflor L. Legrand, MD 03/17/2024 1:07:43 PM This report has been signed electronically. Number of Addenda: 0

## 2024-03-17 NOTE — Anesthesia Postprocedure Evaluation (Signed)
 Anesthesia Post Note  Patient: Krystal Maddox  Procedure(s) Performed: COLONOSCOPY EGD (ESOPHAGOGASTRODUODENOSCOPY)     Patient location during evaluation: Endoscopy Anesthesia Type: MAC Level of consciousness: awake and alert Pain management: pain level controlled Vital Signs Assessment: post-procedure vital signs reviewed and stable Respiratory status: spontaneous breathing, nonlabored ventilation and respiratory function stable Cardiovascular status: stable and blood pressure returned to baseline Postop Assessment: no apparent nausea or vomiting Anesthetic complications: no   No notable events documented.  Last Vitals:  Vitals:   03/17/24 1310 03/17/24 1320  BP: (!) 156/72 (!) 158/69  Pulse: 96 99  Resp: (!) 25 (!) 23  Temp:    SpO2: 97% 97%    Last Pain:  Vitals:   03/17/24 1320  TempSrc:   PainSc: 0-No pain                 Jonique Kulig,W. EDMOND

## 2024-03-17 NOTE — Op Note (Signed)
 [facility] Patient Name: Krystal Maddox Procedure Date : 03/17/2024 MRN: [id number] Attending MD: Victory CROME. Legrand , MD, [id number] Date of Birth: 1941/04/03 CSN: [id number] Age: 83 Admit Type: Inpatient Procedure:                Upper GI endoscopy Indications:              Upper abdominal pain, Unexplained iron deficiency                            anemia Providers:                Victory L. Legrand, MD, Randall Lines, RN, Coye Bade, Technician Referring MD:             [facility] hospitalist Medicines:                Monitored Anesthesia Care Complications:            No immediate complications. Estimated Blood Loss:     Estimated blood loss was minimal. Procedure:                Pre-Anesthesia Assessment:                           - Prior to the procedure, a History and Physical                            was performed, and patient medications and                            allergies were reviewed. The patient's tolerance of                            previous anesthesia was also reviewed. The risks                            and benefits of the procedure and the sedation                            options and risks were discussed with the patient.                            All questions were answered, and informed consent                            was obtained. Prior Anticoagulants: The patient has                            taken no anticoagulant or antiplatelet agents. ASA                            Grade Assessment: IV - A patient with severe  systemic disease that is a constant threat to life.                            After reviewing the risks and benefits, the patient                            was deemed in satisfactory condition to undergo the                            procedure.                           After obtaining informed consent, the endoscope was                            passed under direct  vision. Throughout the                            procedure, the patient's blood pressure, pulse, and                            oxygen saturations were monitored continuously. The                            [id number]-[id number] ([id number]) Olympus endoscope was introduced                            through the mouth, and advanced to the third part                            of duodenum. Scope In: Scope Out: Findings:      The esophagus was normal.      Diffuse moderate inflammation characterized by congestion (edema),       erosions, erythema and friability was found in the entire examined       stomach. Biopsies were taken with a cold forceps for histology. Antrum       and body in same pathology jar to rule out H. pylori. There was a focal       erosion on the cardia side of the EGJ with friability, and other areas       had contact bleeding with minor scope contact. (Nothing amenable to       endoscopic therapy)      There was adherent debris along much of the stomach lining that was       flushed away for better visualization of the mucosa.      The cardia and gastric fundus were normal on retroflexion.      The examined duodenum was normal. Impression:               - Normal esophagus.                           - Gastritis. Biopsied.                           - Normal examined duodenum. Recommendation:           -  Resume previous diet.                           - Continue present medications.                           - Await pathology results.                           - See the other procedure note for documentation of                            additional recommendations.                           - Twice daily oral PPI Procedure Code(s):        --- Professional ---                           [phone], Esophagogastroduodenoscopy, flexible,                            transoral; with biopsy, single or multiple Diagnosis Code(s):        --- Professional ---                           K29.70,  Gastritis, unspecified, without bleeding CPT copyright 2022 American Medical Association. All rights reserved. The codes documented in this report are preliminary and upon coder review may  be revised to meet current compliance requirements. Renell Coaxum L. Legrand, MD 03/17/2024 1:00:02 PM This report has been signed electronically. Number of Addenda: 0

## 2024-03-18 DIAGNOSIS — E871 Hypo-osmolality and hyponatremia: Secondary | ICD-10-CM | POA: Diagnosis not present

## 2024-03-18 LAB — BASIC METABOLIC PANEL WITH GFR
Anion gap: 12 (ref 5–15)
BUN: 15 mg/dL (ref 8–23)
CO2: 21 mmol/L — ABNORMAL LOW (ref 22–32)
Calcium: 8.7 mg/dL — ABNORMAL LOW (ref 8.9–10.3)
Chloride: 100 mmol/L (ref 98–111)
Creatinine, Ser: 1.03 mg/dL — ABNORMAL HIGH (ref 0.44–1.00)
GFR, Estimated: 54 mL/min — ABNORMAL LOW (ref >60)
Glucose, Bld: 102 mg/dL — ABNORMAL HIGH (ref 70–99)
Potassium: 4.6 mmol/L (ref 3.5–5.1)
Sodium: 133 mmol/L — ABNORMAL LOW (ref 135–145)

## 2024-03-18 LAB — GLUCOSE, CAPILLARY
Glucose-Capillary: 84 mg/dL (ref 70–99)
Glucose-Capillary: 96 mg/dL (ref 70–99)
Glucose-Capillary: 123 mg/dL — ABNORMAL HIGH (ref 70–99)
Glucose-Capillary: 159 mg/dL — ABNORMAL HIGH (ref 70–99)

## 2024-03-18 MED ORDER — DOCUSATE SODIUM 100 MG PO CAPS: 100.0000 mg | ORAL_CAPSULE | Freq: Two times a day (BID) | ORAL | 0 refills | Status: AC | PRN

## 2024-03-18 MED ORDER — PANTOPRAZOLE SODIUM 40 MG PO TBEC: 40.0000 mg | DELAYED_RELEASE_TABLET | Freq: Two times a day (BID) | ORAL | Status: DC

## 2024-03-18 MED ORDER — POLYETHYLENE GLYCOL 3350 17 G PO PACK: 17.0000 g | PACK | Freq: Every day | ORAL | 0 refills | Status: AC | PRN

## 2024-03-18 MED ORDER — HYDRALAZINE HCL 25 MG PO TABS: 25.0000 mg | ORAL_TABLET | Freq: Three times a day (TID) | ORAL | 0 refills | Status: DC

## 2024-03-18 MED ORDER — MUSCLE RUB 10-15 % EX CREA: 1.0000 | TOPICAL_CREAM | Freq: Four times a day (QID) | CUTANEOUS | 0 refills | Status: AC | PRN

## 2024-03-18 MED ORDER — FUROSEMIDE 20 MG PO TABS: 20.0000 mg | ORAL_TABLET | Freq: Every day | ORAL | 0 refills | Status: DC

## 2024-03-18 MED ORDER — PANTOPRAZOLE SODIUM 40 MG PO TBEC: 40.0000 mg | DELAYED_RELEASE_TABLET | Freq: Two times a day (BID) | ORAL | 0 refills | Status: AC

## 2024-03-18 NOTE — TOC Transition Note (Addendum)
 Transition of Care [facility]) - Discharge Note   Patient Details  Name: Krystal Maddox MRN: [id number] Date of Birth: May 24, 1941  [facility] CM/SW Contact:  Rosaline JONELLE Joe, RN Phone Number: 03/18/2024, 1:48 PM   Clinical Narrative:    CM called and spoke with patient's family, Mia Acre [phone] and she requested a work excuse for the patient's daughter, Eleni Saltness that is in the room with the patient since admission to the hospital.  Acre is aware that the patient's prescriptions were sent to [facility] on [address] and family plans to pick them up tomorrow.  I called Randine, MSW with [facility] and requested transportation to home today by [facility] along with providing a RW for the patient upon discharge.  Discharge summary will be completed by Dr. Lue and sent to [facility] for continued care.  Discharge Summary was faxed to [facility] to [phone].  Work excuse will be given to the patient's daughter that is present in the room.  03/18/2024 1454 - I spoke with Randine, MSW with [facility] and [facility] will pick patient/ daughter up from the hospital today and will transport her home.  [facility] is aware of correct address. [facility] will provide a RW for the patient at her residence today.  Discharge summary was faxed to [facility] office and MD received the fax.           Patient Goals and CMS Choice            Discharge Placement                       Discharge Plan and Services Additional resources added to the After Visit Summary for                                       Social Drivers of Health (SDOH) Interventions SDOH Screenings   Food Insecurity: Patient Unable To Answer (03/15/2024)  Housing: Patient Unable To Answer (03/15/2024)  Transportation Needs: Patient Unable To Answer (03/15/2024)  Utilities: Patient Unable To Answer (03/15/2024)  Social Connections: Patient Unable To Answer (03/15/2024)  Tobacco Use: Unknown (03/17/2024)      Readmission Risk Interventions    03/15/2024    1:18 PM  Readmission Risk Prevention Plan  Transportation Screening Complete  PCP or Specialist Appt within 3-5 Days Complete  HRI or Home Care Consult Complete  Social Work Consult for Recovery Care Planning/Counseling Complete  Palliative Care Screening Complete  Medication Review Oceanographer) Referral to Pharmacy

## 2024-03-18 NOTE — Progress Notes (Signed)
 Physical Therapy Quick Note  PT has completed initial evaluation.    Overall, patient at contact guard-supervision assistance level.   PT Follow up recommended: Home Health PT Equipment recommended:  Youth rolling walker Complete evaluation note to follow.     Leontine Hilt, [place] [facility] [phone]

## 2024-03-18 NOTE — Progress Notes (Signed)
 [facility] NEPHROLOGY PROGRESS NOTE  Assessment/ Plan: Pt is a 83 y.o. yo female  with past medical history significant for aortic stenosis status post TAVR, hypertension, CAD, osteoporosis, PVD, chronic hyponatremia presented with generalized abdominal pain and peripheral edema for a week, seen as a consultation for the evaluation of hyponatremia and hyperkalemia.  Serum sodium level was 109 on admission.  # Severe acute on chronic hyponatremia: Patient with history of hyponatremia in the past and noted HCTZ on her med list - added to allergies.  Also presented hypervolemic. TSH and cortisol levels unremarkable. UA bland.  Nadir sodium 106 on 7/5.  Started 3% on 7/5 which continued until 7/6 afternoon.   She has been diuresed and by I/Os is -7 for admission but this is incomplete collection even Sodium 133 this AM Volume much improved - given she presented markedly overloaded would d/c with lasix  20 daily with 2nd prn 20mg  dose if edema  low na/fluid restricted diet at d/c TTE showed new severe MR - likely inciting factor for volume issue - will have cardiology f/u at d/c.    # Hyperkalemia: ARB on hold, K 4.6 this AM.  As above cortisol is normal.  Would hold ARB for now until outpt f/u.   #HTN: hold ARB as above, BP this AM 130-150s,  On hydralazine , could d/c on amlodipine .   # CKD 3A: Creatinine level around baseline 1.2 now at 1.  Can f/u with PCP.   # Severe anemia: did require transfusion but stabilized out in the 8s, s/p EGD and colo yesterday, reports pending.  Per primary.   OK for d/c today, diuretics as above - no need for nephrology f/u at thsi time.   Subjective: Sp EGD and colo yesterday.  For d/c today.  Awake, alert.  Good appetite. No UOP documented but is urinating.   Objective Vital signs in last 24 hours: Vitals:   03/18/24 0011 03/18/24 0400 03/18/24 0451 03/18/24 0753  BP: (!) 137/49 (!) 146/55  (!) 154/60  Pulse: 98 98  (!) 108  Resp: 18 18  19    Temp: 98.4 F (36.9 C) 98.1 F (36.7 C)  98.3 F (36.8 C)  TempSrc:      SpO2: 95% 97%  97%  Weight:   68.3 kg   Height:       Weight change: 2.2 kg  Intake/Output Summary (Last 24 hours) at 03/18/2024 0926 Last data filed at 03/17/2024 2130 Gross per 24 hour  Intake 555 ml  Output --  Net 555 ml       Labs: RENAL PANEL Recent Labs  Lab 03/12/24 1346 03/12/24 1743 03/12/24 2016 03/12/24 2312 03/13/24 0418 03/13/24 1038 03/14/24 0413 03/14/24 1210 03/15/24 0331 03/16/24 0220 03/17/24 0903 03/18/24 0219  NA 109*   < > 110*  109* 111* 114*   < > 123* 124* 127* 129* 138 133*  K 6.0*   < > 5.0 4.7 4.8  --  5.9* 5.1 5.0 4.6 4.5 4.6  CL 74*   < > 76* 77* 80*  --  90* 89* 93* 95* 103 100  CO2 20*   < > 22 23 23   --  23 25 26 25 24  21*  GLUCOSE 205*   < > 127* 116* 92  --  135* 209* 91 75 118* 102*  BUN 38*   < > 36* 36* 33*  --  31* 32* 36* 26* 17 15  CREATININE 1.13*   < > 1.12* 1.15* 1.18*  --  1.20* 1.29* 1.24* 0.97 1.06* 1.03*  CALCIUM 8.1*   < > 8.3* 8.3* 8.4*  --  8.5* 8.7* 8.6* 8.6* 9.1 8.7*  MG  --   --  2.7* 2.4 2.5*  --  2.3  --   --   --   --   --   PHOS  --   --   --  4.5 5.3*  --  5.0*  --  3.9  --  4.7*  --   ALBUMIN 2.7*  --   --   --   --   --   --   --   --   --  2.7*  --    < > = values in this interval not displayed.    Liver Function Tests: Recent Labs  Lab 03/12/24 1346 03/17/24 0903  AST 33  --   ALT 31  --   ALKPHOS 115  --   BILITOT 0.5  --   PROT 7.5  --   ALBUMIN 2.7* 2.7*   Recent Labs  Lab 03/12/24 1346  LIPASE 50   No results for input(s): AMMONIA in the last 168 hours. CBC: Recent Labs    03/12/24 1440 03/12/24 2312 03/13/24 1349 03/14/24 0413 03/15/24 0331 03/16/24 0220 03/17/24 0903  HGB  --    < > 8.3* 8.2* 8.7* 8.9* 9.2*  MCV  --    < >  --  73.7* 73.9* 74.1* 74.4*  VITAMINB12 2,263*  --   --   --   --   --   --   FOLATE 18.1  --   --   --   --   --   --   FERRITIN 46  --   --   --   --   --   --   TIBC  447  --   --   --   --   --   --   IRON 9*  --   --   --   --   --   --   RETICCTPCT 3.5*  --   --   --   --   --   --    < > = values in this interval not displayed.    Cardiac Enzymes: No results for input(s): CKTOTAL, CKMB, CKMBINDEX, TROPONINI in the last 168 hours. CBG: Recent Labs  Lab 03/17/24 1705 03/17/24 2005 03/18/24 0116 03/18/24 0402 03/18/24 0754  GLUCAP 139* 138* 84 96 123*    Iron Studies:  No results for input(s): IRON, TIBC, TRANSFERRIN, FERRITIN in the last 72 hours.  Studies/Results: CT ABDOMEN PELVIS W CONTRAST Result Date: 03/17/2024 CLINICAL DATA:  Nonlocalized abdominal pain EXAM: CT ABDOMEN AND PELVIS WITH CONTRAST TECHNIQUE: Multidetector CT imaging of the abdomen and pelvis was performed using the standard protocol following bolus administration of intravenous contrast. RADIATION DOSE REDUCTION: This exam was performed according to the departmental dose-optimization program which includes automated exposure control, adjustment of the mA and/or kV according to patient size and/or use of iterative reconstruction technique. CONTRAST:  60mL OMNIPAQUE  IOHEXOL  350 MG/ML SOLN COMPARISON:  CT 06/14/2021 FINDINGS: Lower chest: Lung bases demonstrate scattered hazy pulmonary densities which may be due to minimal edema or small airways disease. Cardiomegaly. TAVR. Coronary vascular calcification Hepatobiliary: Gallstone. No biliary dilatation or focal hepatic abnormality Pancreas: Unremarkable. No pancreatic ductal dilatation or surrounding inflammatory changes. Spleen: Stable hypodense lesion in the medial spleen, no specific imaging follow-up is recommended Adrenals/Urinary Tract: Adrenal glands are  normal. Kidneys show no hydronephrosis. Renal cysts and subcentimeter hypodensities too small to further characterize. The bladder is normal Stomach/Bowel: The stomach is nonenlarged. There is no dilated small bowel. No acute bowel wall thickening  Vascular/Lymphatic: Aortic atherosclerosis. No enlarged abdominal or pelvic lymph nodes. Reproductive: No suspicious adnexal mass. Endometrial stripe up to 8 mm on sagittal views. Other: Negative for pelvic effusion or free air Musculoskeletal: No acute or suspicious osseous abnormality IMPRESSION: 1. No CT evidence for acute intra-abdominal or pelvic abnormality. 2. Gallstone. 3. Endometrial stripe up to 8 mm on sagittal views, recommend correlation with pelvic ultrasound. 4. Aortic atherosclerosis. Aortic Atherosclerosis (ICD10-I70.0). Electronically Signed   By: Luke Bun M.D.   On: 03/17/2024 23:43   ECHOCARDIOGRAM COMPLETE Result Date: 03/16/2024    ECHOCARDIOGRAM REPORT   Patient Name:   [facility] Date of Exam: 03/16/2024 Medical Rec #:  [id number]     Height:       61.0 in Accession #:    [id number]    Weight:       150.6 lb Date of Birth:  Jan 11, 1941      BSA:          1.674 m Patient Age:    83 years      BP:           160/68 mmHg Patient Gender: F             HR:           95 bpm. Exam Location:  Inpatient Procedure: 2D Echo, Color Doppler and Cardiac Doppler (Both Spectral and Color            Flow Doppler were utilized during procedure). Indications:    CHF I50.9  History:        Patient has prior history of Echocardiogram examinations, most                 recent 09/12/2022.                 Aortic Valve: 26 mm Medtronic stented TAVR valve is present in                 the aortic position. Procedure Date: 08/13/21.  Sonographer:    Tinnie Gosling RDCS Referring Phys: [phone] Akshay Spang A Chayce Rullo IMPRESSIONS  1. S/P TAVR with mean gradient 7 mmHg and no AI; moderate to severe MR (new compared to previous).  2. Left ventricular ejection fraction, by estimation, is 60 to 65%. The left ventricle has normal function. The left ventricle has no regional wall motion abnormalities. Left ventricular diastolic parameters are indeterminate.  3. Right ventricular systolic function is normal. The right ventricular size  is normal.  4. The mitral valve is normal in structure. Moderate to severe mitral valve regurgitation. No evidence of mitral stenosis.  5. The aortic valve has been repaired/replaced. Aortic valve regurgitation is not visualized. No aortic stenosis is present. There is a 26 mm Medtronic stented TAVR valve present in the aortic position. Procedure Date: 08/13/21.  6. The inferior vena cava is normal in size with greater than 50% respiratory variability, suggesting right atrial pressure of 3 mmHg. FINDINGS  Left Ventricle: Left ventricular ejection fraction, by estimation, is 60 to 65%. The left ventricle has normal function. The left ventricle has no regional wall motion abnormalities. The left ventricular internal cavity size was normal in size. There is  no left ventricular hypertrophy. Left ventricular diastolic parameters are indeterminate. Right Ventricle: The right ventricular  size is normal. Right ventricular systolic function is normal. Left Atrium: Left atrial size was normal in size. Right Atrium: Right atrial size was normal in size. Pericardium: There is no evidence of pericardial effusion. Mitral Valve: The mitral valve is normal in structure. Mild mitral annular calcification. Moderate to severe mitral valve regurgitation. No evidence of mitral valve stenosis. Tricuspid Valve: The tricuspid valve is normal in structure. Tricuspid valve regurgitation is mild . No evidence of tricuspid stenosis. Aortic Valve: The aortic valve has been repaired/replaced. Aortic valve regurgitation is not visualized. No aortic stenosis is present. There is a 26 mm Medtronic stented TAVR valve present in the aortic position. Procedure Date: 08/13/21. Pulmonic Valve: The pulmonic valve was normal in structure. Pulmonic valve regurgitation is trivial. No evidence of pulmonic stenosis. Aorta: The aortic root is normal in size and structure. Venous: The inferior vena cava is normal in size with greater than 50% respiratory  variability, suggesting right atrial pressure of 3 mmHg. IAS/Shunts: No atrial level shunt detected by color flow Doppler. Additional Comments: S/P TAVR with mean gradient 7 mmHg and no AI; moderate to severe MR (new compared to previous).  LEFT VENTRICLE PLAX 2D LVIDd:         4.60 cm LVIDs:         3.10 cm LV PW:         0.90 cm LV IVS:        0.90 cm LVOT diam:     1.90 cm LV SV:         54 LV SV Index:   32 LVOT Area:     2.84 cm  RIGHT VENTRICLE             IVC RV S prime:     11.20 cm/s  IVC diam: 1.40 cm TAPSE (M-mode): 2.0 cm LEFT ATRIUM           Index        RIGHT ATRIUM           Index LA diam:      4.60 cm 2.75 cm/m   RA Area:     15.20 cm LA Vol (A2C): 49.7 ml 29.69 ml/m  RA Volume:   38.10 ml  22.76 ml/m LA Vol (A4C): 42.5 ml 25.39 ml/m  AORTIC VALVE LVOT Vmax:   100.00 cm/s LVOT Vmean:  66.500 cm/s LVOT VTI:    0.191 m  AORTA Ao Root diam: 2.60 cm Ao Asc diam:  3.20 cm  SHUNTS Systemic VTI:  0.19 m Systemic Diam: 1.90 cm Redell Shallow MD Electronically signed by Redell Shallow MD Signature Date/Time: 03/16/2024/2:57:29 PM    Final      Medications: Infusions:    Scheduled Medications:  Chlorhexidine  Gluconate Cloth  6 each Topical Daily   ferrous sulfate   325 mg Oral BID WC   gabapentin   100 mg Oral BID   hydrALAZINE   25 mg Oral Q8H   insulin  aspart  0-15 Units Subcutaneous Q4H   pantoprazole  (PROTONIX ) IV  40 mg Intravenous Q12H   polyethylene glycol  17 g Oral Daily   pravastatin   80 mg Oral QPM    have reviewed scheduled and prn medications.  Physical Exam: General:NAD, comfortable Heart:RRR, s1s2 nl Lungs:clear b/l, no crackle Abdomen:soft, Non-tender, non-distended Extremities: Bilateral extremities pitting edema trace Neurology: Alert, awake and following commands  Manuelita DELENA Barters 03/18/2024,9:26 AM  LOS: 6 days

## 2024-03-18 NOTE — Discharge Summary (Signed)
 Physician Discharge Summary  Krystal Maddox FMW:[id number] DOB: 12-07-1940 DOA: 03/12/2024  PCP: Cloria Annabella CROME, DO  Admit date: 03/12/2024 Discharge date: 03/18/2024  Admitted From: Home Disposition: Home  Recommendations for Outpatient Follow-up:  Follow up with PCP in 1-2 weeks  Home Health: PT Equipment/Devices: Use walker  Discharge Condition: Stable CODE STATUS: Full Diet recommendation: Low-salt low-fat low-carb diet  Brief/Interim Summary: Krystal Maddox is a 83 y.o. female with past medical history significant for with past medical history significant for severe AS s/p TAVR, HTN, CAD, DM2, PVD, osteoporosis who presented to [facility] ED on 03/12/2024 with complaints of generalized abdominal pain, decreased appetite.  Patient reports onset of symptoms roughly 1 week prior. Nephrology and gastroenterology were consulted.  Patient was administered IV Lasix , IV fluids, Protonix  IV, insulin , dextrose  and Lokelma .  PCCM consulted for admission and patient was admitted to the intensive care unit given multiple severe electrolyte derangements, need for 3% saline, and symptomatic anemia.   Significant hospital events: 7/5: Admit to ICU, nephrology and GI consulted, transfuse 1 unit PRBC, Lasix  40 IV x 1, started on 3% saline infusion 7/6: Transfused 1 unit PRBC, Lasix  40 IV x1, 3% saline infusion discontinued 7/7: Lasix  40 IV x 1 7/8: Na 127 7/9: Echo shows mod/severe MR - cardiology to set up for outpatient evaluation 7/10: EGD/colonoscopy unremarkable for acute findings that would explain patient's abdominal discomfort.  Notably multiple small polyps removed per report- path pending - follow up with GI as scheduled.  CT abdomen pelvis without any acute findings  Discharge Diagnoses:  Principal Problem:   Acute hyponatremia Active Problems:   Severe aortic stenosis   Peripheral arterial disease (HCC)   CKD (chronic kidney disease), stage III (HCC)   S/P TAVR (transcatheter aortic valve  replacement)   Hypertension   Symptomatic anemia   Abdominal pain   Iron deficiency anemia due to chronic blood loss   Heme positive stool   Chronic alcoholic gastritis   Benign neoplasm of ascending colon   Benign neoplasm of descending colon   AVM (arteriovenous malformation) of colon with hemorrhage  Acute on chronic hyponatremia Hypochloremia - Etiology likely multifactorial in the setting of hypervolemia and hydrochlorothiazide prescription.  - Sodium level 109 at intake, resolved with hypertonic saline and intermittent Lasix  currently borderline low normal.   - Nephrology following, appreciate assistance -echo remarkable for moderate to severe mitral regurgitation, cardiology aware -Continue Lasix  scheduled and as needed per nephrology -Follow-up labs with PCP in the next 1 to 2 weeks to ensure stable sodium and chloride   Hyperkalemia: Resolved -Secondary to losartan , continue to hold per nephrology   Symptomatic iron deficiency anemia Rule out concurrent acute on chronic GI bleed - Patient presenting with a hemoglobin of 5.7 - Transfused total of 2 unit PRBCs-labs stabilizing -Upper and lower endoscopy completed 7/10 with multiple polyps but no clear signs or symptoms of bleeding -Continue iron supplementation, increased diet as discussed   Moderate to Severe mitral regurgitation TAVR history -Discussed with cardiology -Outpatient follow up planned for further evaluation, no indication for further imaging or workup at this time   HTN -Home losartan /HCTZ discontinued -Continue home amlodipine , new scheduled and as needed furosemide  as well as hydralazine   DM2, well-controlled A1c 6.4 - Resume diabetic diet, home medications at discharge   HLD -Continue pravastatin  80 mg p.o. daily  GERD - Continue PPI   Discharge Instructions  Discharge Instructions     Call MD for:  difficulty breathing, headache or visual disturbances  Complete by: As directed    Call MD  for:  extreme fatigue   Complete by: As directed    Call MD for:  hives   Complete by: As directed    Call MD for:  persistant dizziness or light-headedness   Complete by: As directed    Call MD for:  persistant nausea and vomiting   Complete by: As directed    Call MD for:  severe uncontrolled pain   Complete by: As directed    Call MD for:  temperature >100.4   Complete by: As directed    Diet - low sodium heart healthy   Complete by: As directed    Increase activity slowly   Complete by: As directed       Allergies as of 03/18/2024       Reactions   Hydrochlorothiazide Other (See Comments)   Severe hyponatremia   Tylenol  [acetaminophen ]    Pt reports long time ago she got sick on tylenol  she not sure what happen but takes Tylenol  now.        Medication List     STOP taking these medications    BIOFREEZE EX Replaced by: Muscle Rub 10-15 % Crea   cefUROXime 500 MG tablet Commonly known as: CEFTIN   losartan  50 MG tablet Commonly known as: COZAAR        TAKE these medications    amLODipine  10 MG tablet Commonly known as: NORVASC  Take 10 mg by mouth daily.   amoxicillin  500 MG tablet Commonly known as: AMOXIL  Take 4 tablets by mouth 1 hour prior to dental procedures and cleanings   Arctic Relief Pain Relieving 0.2-3.5 % Gel Generic drug: Camphor-Menthol  Apply 1 Application topically 4 (four) times daily as needed (joint or muscle pain).   aspirin  EC 81 MG tablet Take 81 mg by mouth daily.   chlorhexidine  0.12 % solution Commonly known as: PERIDEX  Use as directed 15 mLs in the mouth or throat every evening. Swish for 30 seconds and spit   colchicine  0.6 MG tablet Take 1 tablet (0.6 mg total) by mouth daily.   dextrose  40 % Gel Commonly known as: GLUTOSE Take 1 Tube by mouth every 15 (fifteen) minutes as needed for low blood sugar.   docusate sodium  100 MG capsule Commonly known as: COLACE Take 1 capsule (100 mg total) by mouth 2 (two) times  daily as needed for mild constipation.   ferrous sulfate  325 (65 FE) MG tablet Take 325 mg by mouth daily.   furosemide  20 MG tablet Commonly known as: LASIX  Take 1 tablet (20 mg total) by mouth daily. Take 1 extra tablet (20mg ) at night if swelling is present What changed: additional instructions   glipiZIDE 5 MG tablet Commonly known as: GLUCOTROL Take 5 mg by mouth 2 (two) times daily before a meal.   GNP Calcium 600 +D/Minerals 600-800 MG-UNIT Tabs Take 1 tablet by mouth in the morning and at bedtime.   hydrALAZINE  25 MG tablet Commonly known as: APRESOLINE  Take 1 tablet (25 mg total) by mouth every 8 (eight) hours.   insulin  aspart 100 UNIT/ML injection Commonly known as: novoLOG  Inject 10 Units into the skin See admin instructions. For CBG greater than 400   linagliptin 5 MG Tabs tablet Commonly known as: TRADJENTA Take 5 mg by mouth daily.   mineral oil-hydrophilic petrolatum ointment Apply 1 Application topically 2 (two) times daily as needed for dry skin.   Muscle Rub 10-15 % Crea Apply 1 Application topically 4 (four)  times daily as needed (joint/muscle pain). Replaces: BIOFREEZE EX   Orajel 2X Toothache & Gum 20-0.26 % Gel Generic drug: Benzocaine-Menthol  Use as directed 1 Application in the mouth or throat 3 (three) times daily as needed (painful gum).   pantoprazole  40 MG tablet Commonly known as: PROTONIX  Take 1 tablet (40 mg total) by mouth 2 (two) times daily. What changed: when to take this   polyethylene glycol 17 g packet Commonly known as: MIRALAX  / GLYCOLAX  Take 17 g by mouth daily as needed for moderate constipation.   pravastatin  80 MG tablet Commonly known as: PRAVACHOL  Take 80 mg by mouth every evening.   PRESERVISION AREDS 2 PO Take 1 tablet by mouth in the morning and at bedtime.   psyllium 95 % Pack Commonly known as: HYDROCIL/METAMUCIL Take 1 packet by mouth daily.   senna-docusate 8.6-50 MG tablet Commonly known as:  Senokot-S Take 2 tablets by mouth at bedtime.   sodium bicarbonate 650 MG tablet Take 650 mg by mouth 2 (two) times daily.   Systane Hydration PF 0.4-0.3 % Soln Generic drug: Polyethyl Glyc-Propyl Glyc PF Place 1 drop into both eyes in the morning, at noon, in the evening, and at bedtime.   Systane Nighttime Oint Place 1 Application into both eyes at bedtime.   VITRON-C PO Take 1 tablet by mouth daily in the afternoon.               Durable Medical Equipment  (From admission, onward)           Start     Ordered   03/18/24 1300  For home use only DME Walker youth  Once       Question:  Patient needs a walker to treat with the following condition  Answer:  Ambulatory dysfunction   03/18/24 1259            Allergies  Allergen Reactions   Hydrochlorothiazide Other (See Comments)    Severe hyponatremia    Tylenol  [Acetaminophen ]     Pt reports long time ago she got sick on tylenol  she not sure what happen but takes Tylenol  now.    Consultations: Nephrology, GI  Procedures/Studies: CT ABDOMEN PELVIS W CONTRAST Result Date: 03/17/2024 CLINICAL DATA:  Nonlocalized abdominal pain EXAM: CT ABDOMEN AND PELVIS WITH CONTRAST TECHNIQUE: Multidetector CT imaging of the abdomen and pelvis was performed using the standard protocol following bolus administration of intravenous contrast. RADIATION DOSE REDUCTION: This exam was performed according to the departmental dose-optimization program which includes automated exposure control, adjustment of the mA and/or kV according to patient size and/or use of iterative reconstruction technique. CONTRAST:  60mL OMNIPAQUE  IOHEXOL  350 MG/ML SOLN COMPARISON:  CT 06/14/2021 FINDINGS: Lower chest: Lung bases demonstrate scattered hazy pulmonary densities which may be due to minimal edema or small airways disease. Cardiomegaly. TAVR. Coronary vascular calcification Hepatobiliary: Gallstone. No biliary dilatation or focal hepatic abnormality  Pancreas: Unremarkable. No pancreatic ductal dilatation or surrounding inflammatory changes. Spleen: Stable hypodense lesion in the medial spleen, no specific imaging follow-up is recommended Adrenals/Urinary Tract: Adrenal glands are normal. Kidneys show no hydronephrosis. Renal cysts and subcentimeter hypodensities too small to further characterize. The bladder is normal Stomach/Bowel: The stomach is nonenlarged. There is no dilated small bowel. No acute bowel wall thickening Vascular/Lymphatic: Aortic atherosclerosis. No enlarged abdominal or pelvic lymph nodes. Reproductive: No suspicious adnexal mass. Endometrial stripe up to 8 mm on sagittal views. Other: Negative for pelvic effusion or free air Musculoskeletal: No acute or suspicious osseous abnormality  IMPRESSION: 1. No CT evidence for acute intra-abdominal or pelvic abnormality. 2. Gallstone. 3. Endometrial stripe up to 8 mm on sagittal views, recommend correlation with pelvic ultrasound. 4. Aortic atherosclerosis. Aortic Atherosclerosis (ICD10-I70.0). Electronically Signed   By: Luke Bun M.D.   On: 03/17/2024 23:43   ECHOCARDIOGRAM COMPLETE Result Date: 03/16/2024    ECHOCARDIOGRAM REPORT   Patient Name:   [facility] Date of Exam: 03/16/2024 Medical Rec #:  [id number]     Height:       61.0 in Accession #:    [id number]    Weight:       150.6 lb Date of Birth:  [birth year]      BSA:          1.674 m Patient Age:    83 years      BP:           160/68 mmHg Patient Gender: F             HR:           95 bpm. Exam Location:  Inpatient Procedure: 2D Echo, Color Doppler and Cardiac Doppler (Both Spectral and Color            Flow Doppler were utilized during procedure). Indications:    CHF I50.9  History:        Patient has prior history of Echocardiogram examinations, most                 recent 09/12/2022.                 Aortic Valve: 26 mm Medtronic stented TAVR valve is present in                 the aortic position. Procedure Date: 08/13/21.   Sonographer:    Tinnie Gosling RDCS Referring Phys: [phone] LINDSAY A KRUSKA IMPRESSIONS  1. S/P TAVR with mean gradient 7 mmHg and no AI; moderate to severe MR (new compared to previous).  2. Left ventricular ejection fraction, by estimation, is 60 to 65%. The left ventricle has normal function. The left ventricle has no regional wall motion abnormalities. Left ventricular diastolic parameters are indeterminate.  3. Right ventricular systolic function is normal. The right ventricular size is normal.  4. The mitral valve is normal in structure. Moderate to severe mitral valve regurgitation. No evidence of mitral stenosis.  5. The aortic valve has been repaired/replaced. Aortic valve regurgitation is not visualized. No aortic stenosis is present. There is a 26 mm Medtronic stented TAVR valve present in the aortic position. Procedure Date: 08/13/21.  6. The inferior vena cava is normal in size with greater than 50% respiratory variability, suggesting right atrial pressure of 3 mmHg. FINDINGS  Left Ventricle: Left ventricular ejection fraction, by estimation, is 60 to 65%. The left ventricle has normal function. The left ventricle has no regional wall motion abnormalities. The left ventricular internal cavity size was normal in size. There is  no left ventricular hypertrophy. Left ventricular diastolic parameters are indeterminate. Right Ventricle: The right ventricular size is normal. Right ventricular systolic function is normal. Left Atrium: Left atrial size was normal in size. Right Atrium: Right atrial size was normal in size. Pericardium: There is no evidence of pericardial effusion. Mitral Valve: The mitral valve is normal in structure. Mild mitral annular calcification. Moderate to severe mitral valve regurgitation. No evidence of mitral valve stenosis. Tricuspid Valve: The tricuspid valve is normal in structure. Tricuspid valve regurgitation is mild .  No evidence of tricuspid stenosis. Aortic Valve: The aortic  valve has been repaired/replaced. Aortic valve regurgitation is not visualized. No aortic stenosis is present. There is a 26 mm Medtronic stented TAVR valve present in the aortic position. Procedure Date: 08/13/21. Pulmonic Valve: The pulmonic valve was normal in structure. Pulmonic valve regurgitation is trivial. No evidence of pulmonic stenosis. Aorta: The aortic root is normal in size and structure. Venous: The inferior vena cava is normal in size with greater than 50% respiratory variability, suggesting right atrial pressure of 3 mmHg. IAS/Shunts: No atrial level shunt detected by color flow Doppler. Additional Comments: S/P TAVR with mean gradient 7 mmHg and no AI; moderate to severe MR (new compared to previous).  LEFT VENTRICLE PLAX 2D LVIDd:         4.60 cm LVIDs:         3.10 cm LV PW:         0.90 cm LV IVS:        0.90 cm LVOT diam:     1.90 cm LV SV:         54 LV SV Index:   32 LVOT Area:     2.84 cm  RIGHT VENTRICLE             IVC RV S prime:     11.20 cm/s  IVC diam: 1.40 cm TAPSE (M-mode): 2.0 cm LEFT ATRIUM           Index        RIGHT ATRIUM           Index LA diam:      4.60 cm 2.75 cm/m   RA Area:     15.20 cm LA Vol (A2C): 49.7 ml 29.69 ml/m  RA Volume:   38.10 ml  22.76 ml/m LA Vol (A4C): 42.5 ml 25.39 ml/m  AORTIC VALVE LVOT Vmax:   100.00 cm/s LVOT Vmean:  66.500 cm/s LVOT VTI:    0.191 m  AORTA Ao Root diam: 2.60 cm Ao Asc diam:  3.20 cm  SHUNTS Systemic VTI:  0.19 m Systemic Diam: 1.90 cm Redell Shallow MD Electronically signed by Redell Shallow MD Signature Date/Time: 03/16/2024/2:57:29 PM    Final    DG Chest Portable 1 View Result Date: 03/12/2024 CLINICAL DATA:  Shortness of breath EXAM: PORTABLE CHEST 1 VIEW COMPARISON:  08/16/2021 FINDINGS: Cardiomegaly, vascular congestion. Right basilar opacity. No confluent opacity on the left. No overt edema or effusions. No acute bony abnormality. Aortic atherosclerosis. IMPRESSION: Cardiomegaly, vascular congestion. Right basilar  atelectasis or infiltrate. Electronically Signed   By: Franky Crease M.D.   On: 03/12/2024 15:02     Subjective: No acute issues or events overnight   Discharge Exam: Vitals:   03/18/24 0753 03/18/24 1234  BP: (!) 154/60 (!) 152/58  Pulse: (!) 108 (!) 102  Resp: 19 19  Temp: 98.3 F (36.8 C) 99.1 F (37.3 C)  SpO2: 97% 100%   Vitals:   03/18/24 0400 03/18/24 0451 03/18/24 0753 03/18/24 1234  BP: (!) 146/55  (!) 154/60 (!) 152/58  Pulse: 98  (!) 108 (!) 102  Resp: 18  19 19   Temp: 98.1 F (36.7 C)  98.3 F (36.8 C) 99.1 F (37.3 C)  TempSrc:      SpO2: 97%  97% 100%  Weight:  68.3 kg    Height:        General: Pt is alert, awake, not in acute distress Cardiovascular: RRR, S1/S2 +, no rubs, no  gallops Respiratory: CTA bilaterally, no wheezing, no rhonchi Abdominal: Soft, NT, ND, bowel sounds + Extremities: no edema, no cyanosis    The results of significant diagnostics from this hospitalization (including imaging, microbiology, ancillary and laboratory) are listed below for reference.     Microbiology: Recent Results (from the past 240 hours)  MRSA Next Gen by PCR, Nasal     Status: None   Collection Time: 03/12/24  5:44 PM   Specimen: Nasal Mucosa; Nasal Swab  Result Value Ref Range Status   MRSA by PCR Next Gen NOT DETECTED NOT DETECTED Final    Comment: (NOTE) The GeneXpert MRSA Assay (FDA approved for NASAL specimens only), is one component of a comprehensive MRSA colonization surveillance program. It is not intended to diagnose MRSA infection nor to guide or monitor treatment for MRSA infections. Test performance is not FDA approved in patients less than 11 years old. Performed at [facility], [address] [place], [place] [address]      Labs: BNP (last 3 results) Recent Labs    03/12/24 1743  BNP 795.1*   Basic Metabolic Panel: Recent Labs  Lab 03/12/24 2016 03/12/24 2312 03/13/24 0418 03/13/24 1038 03/14/24 0413 03/14/24 1210  03/15/24 0331 03/16/24 0220 03/17/24 0903 03/18/24 0219  NA 110*  109* 111* 114*   < > 123* 124* 127* 129* 138 133*  K 5.0 4.7 4.8  --  5.9* 5.1 5.0 4.6 4.5 4.6  CL 76* 77* 80*  --  90* 89* 93* 95* 103 100  CO2 22 23 23   --  23 25 26 25 24  21*  GLUCOSE 127* 116* 92  --  135* 209* 91 75 118* 102*  BUN 36* 36* 33*  --  31* 32* 36* 26* 17 15  CREATININE 1.12* 1.15* 1.18*  --  1.20* 1.29* 1.24* 0.97 1.06* 1.03*  CALCIUM 8.3* 8.3* 8.4*  --  8.5* 8.7* 8.6* 8.6* 9.1 8.7*  MG 2.7* 2.4 2.5*  --  2.3  --   --   --   --   --   PHOS  --  4.5 5.3*  --  5.0*  --  3.9  --  4.7*  --    < > = values in this interval not displayed.   Liver Function Tests: Recent Labs  Lab 03/12/24 1346 03/17/24 0903  AST 33  --   ALT 31  --   ALKPHOS 115  --   BILITOT 0.5  --   PROT 7.5  --   ALBUMIN 2.7* 2.7*   Recent Labs  Lab 03/12/24 1346  LIPASE 50   No results for input(s): AMMONIA in the last 168 hours. CBC: Recent Labs  Lab 03/12/24 1346 03/12/24 2312 03/13/24 0418 03/13/24 1349 03/14/24 0413 03/15/24 0331 03/16/24 0220 03/17/24 0903  WBC 13.3*   < > 13.0*  --  10.5 14.1* 12.0* 9.7  NEUTROABS 10.7*  --   --   --   --   --   --   --   HGB 5.7*   < > 6.8* 8.3* 8.2* 8.7* 8.9* 9.2*  HCT 18.0*   < > 20.9* 24.3* 26.3* 27.5* 29.2* 30.0*  MCV 67.7*   < > 69.2*  --  73.7* 73.9* 74.1* 74.4*  PLT 366   < > 388  --  437* 488* 535* 533*   < > = values in this interval not displayed.   Cardiac Enzymes: No results for input(s): CKTOTAL, CKMB, CKMBINDEX, TROPONINI in  the last 168 hours. BNP: Invalid input(s): POCBNP CBG: Recent Labs  Lab 03/17/24 2005 03/18/24 0116 03/18/24 0402 03/18/24 0754 03/18/24 1234  GLUCAP 138* 84 96 123* 159*   D-Dimer No results for input(s): DDIMER in the last 72 hours. Hgb A1c No results for input(s): HGBA1C in the last 72 hours. Lipid Profile No results for input(s): CHOL, HDL, LDLCALC, TRIG, CHOLHDL, LDLDIRECT in the last 72  hours. Thyroid function studies No results for input(s): TSH, T4TOTAL, T3FREE, THYROIDAB in the last 72 hours.  Invalid input(s): FREET3 Anemia work up No results for input(s): VITAMINB12, FOLATE, FERRITIN, TIBC, IRON, RETICCTPCT in the last 72 hours. Urinalysis    Component Value Date/Time   COLORURINE YELLOW 03/12/2024 1428   APPEARANCEUR CLEAR 03/12/2024 1428   LABSPEC 1.010 03/12/2024 1428   PHURINE 6.0 03/12/2024 1428   GLUCOSEU NEGATIVE 03/12/2024 1428   HGBUR NEGATIVE 03/12/2024 1428   BILIRUBINUR NEGATIVE 03/12/2024 1428   KETONESUR NEGATIVE 03/12/2024 1428   PROTEINUR 100 (A) 03/12/2024 1428   NITRITE NEGATIVE 03/12/2024 1428   LEUKOCYTESUR NEGATIVE 03/12/2024 1428   Sepsis Labs Recent Labs  Lab 03/14/24 0413 03/15/24 0331 03/16/24 0220 03/17/24 0903  WBC 10.5 14.1* 12.0* 9.7   Microbiology Recent Results (from the past 240 hours)  MRSA Next Gen by PCR, Nasal     Status: None   Collection Time: 03/12/24  5:44 PM   Specimen: Nasal Mucosa; Nasal Swab  Result Value Ref Range Status   MRSA by PCR Next Gen NOT DETECTED NOT DETECTED Final    Comment: (NOTE) The GeneXpert MRSA Assay (FDA approved for NASAL specimens only), is one component of a comprehensive MRSA colonization surveillance program. It is not intended to diagnose MRSA infection nor to guide or monitor treatment for MRSA infections. Test performance is not FDA approved in patients less than 52 years old. Performed at [facility], [address], [place], [place] [address]      Time coordinating discharge: Over 30 minutes  SIGNED:   Elsie JAYSON Montclair, DO [facility] 03/18/2024, 1:59 PM Pager   If 7PM-7AM, please contact night-coverage [email or web address]

## 2024-03-18 NOTE — Evaluation (Signed)
 Physical Therapy Evaluation Patient Details Name: Krystal Maddox MRN: [id number] DOB: [birth year] Today's Date: 03/18/2024  History of Present Illness  Pt is an 83 y.o. F presenting to [facility] on 03/12/24 w/ c/o abdominal pain and reduced appetite for 1 week. Pt admitted w/ severe hyponatremia. PMH is significant for aortic stenosis s/p TABR< HTN, CAD, OP, and PVD.  Clinical Impression  Prior to admittance, pt was mobilizing w/ a SPC for the past 3 weeks, and reports needing assistance w/ ADLs. Pt presents to evaluation with deficits in mobility, strength, power, balance, activity tolerance, and pain, all limiting pt's ability to mobilize near baseline. Pt ambulated w/ SPC and 1 HHA, and then trialed ambulation w/ RW, demonstrating improved stability. Recommending pt utilize RW for mobility at home. Pt reports her grandsons are able to help her ascend steps at home. Pt would benefit from further gait and stair training. PT will continue to treat pt while she is admitted.  Recommending HHPT at discharge to address remaining mobility deficits and optimize return to PLOF.         If plan is discharge home, recommend the following: A little help with walking and/or transfers;A little help with bathing/dressing/bathroom;Assist for transportation;Help with stairs or ramp for entrance   Can travel by private vehicle        Equipment Recommendations Rolling walker (2 wheels) (youth RW)  Recommendations for Other Services       Functional Status Assessment Patient has had a recent decline in their functional status and demonstrates the ability to make significant improvements in function in a reasonable and predictable amount of time.     Precautions / Restrictions Precautions Precautions: Fall Recall of Precautions/Restrictions: Intact Restrictions Weight Bearing Restrictions Per Provider Order: No      Mobility  Bed Mobility Overal bed mobility: Needs Assistance Bed Mobility: Supine to Sit, Sit  to Supine     Supine to sit: Supervision, HOB elevated Sit to supine: Supervision, HOB elevated   General bed mobility comments: increased time to complete    Transfers Overall transfer level: Needs assistance Equipment used: Straight cane Transfers: Sit to/from Stand Sit to Stand: Supervision           General transfer comment: 2 STS from EOB w/ SPC in right hand; increased time to complete    Ambulation/Gait Ambulation/Gait assistance: Contact guard assist, Supervision Gait Distance (Feet): 20 Feet (x3) Assistive device: Straight cane, Rolling walker (2 wheels) Gait Pattern/deviations: Step-through pattern, Decreased stride length, Trunk flexed Gait velocity: reduced Gait velocity interpretation: <1.8 ft/sec, indicate of risk for recurrent falls   General Gait Details: Pt ambulated 20' w/ SPC and 1 HHA with lateral trunk lean when advancing contralateral limb. Pt then ambualted w/ SPC, and then trialed RW. Pt appeared more stable with less lateral sway throughout when mobilizing w/ RW.  Stairs            Wheelchair Mobility     Tilt Bed    Modified Rankin (Stroke Patients Only)       Balance Overall balance assessment: Needs assistance Sitting-balance support: No upper extremity supported, Feet supported Sitting balance-Leahy Scale: Good Sitting balance - Comments: seated EOB   Standing balance support: Single extremity supported, During functional activity Standing balance-Leahy Scale: Poor Standing balance comment: reliant on external support                             Pertinent Vitals/Pain Pain Assessment Pain Assessment:  Faces Faces Pain Scale: Hurts little more Pain Intervention(s): Limited activity within patient's tolerance, Monitored during session    Home Living Family/patient expects to be discharged to:: Private residence Living Arrangements: Children;Other relatives (grandkids) Available Help at Discharge:  Family;Available 24 hours/day Type of Home: House Home Access: Stairs to enter Entrance Stairs-Rails: Right;Left;Can reach both Entrance Stairs-Number of Steps: 2   Home Layout: One level Home Equipment: Cane - single point      Prior Function Prior Level of Function : Independent/Modified Independent             Mobility Comments: independent w/ SPC for past 3 weeks due to bilateral knee pain ADLs Comments: needs assistance with bathing and dressing     Extremity/Trunk Assessment   Upper Extremity Assessment Upper Extremity Assessment: Generalized weakness    Lower Extremity Assessment Lower Extremity Assessment: Generalized weakness    Cervical / Trunk Assessment Cervical / Trunk Assessment: Kyphotic  Communication   Communication Communication: No apparent difficulties    Cognition Arousal: Alert Behavior During Therapy: WFL for tasks assessed/performed   PT - Cognitive impairments: No apparent impairments                         Following commands: Intact       Cueing Cueing Techniques: Verbal cues, Visual cues, Gestural cues     General Comments General comments (skin integrity, edema, etc.): no signs of acute distress    Exercises     Assessment/Plan    PT Assessment Patient needs continued PT services  PT Problem List Decreased strength;Decreased balance;Decreased activity tolerance;Decreased mobility;Decreased coordination;Decreased knowledge of use of DME;Pain       PT Treatment Interventions DME instruction;Stair training;Gait training;Functional mobility training;Therapeutic activities;Therapeutic exercise;Balance training;Patient/family education;Wheelchair mobility training;Manual techniques;Modalities    PT Goals (Current goals can be found in the Care Plan section)  Acute Rehab PT Goals Patient Stated Goal: to go home PT Goal Formulation: With patient/family Time For Goal Achievement: 04/01/24 Potential to Achieve Goals:  Good    Frequency Min 2X/week     Co-evaluation               AM-PAC PT 6 Clicks Mobility  Outcome Measure Help needed turning from your back to your side while in a flat bed without using bedrails?: A Little Help needed moving from lying on your back to sitting on the side of a flat bed without using bedrails?: A Little Help needed moving to and from a bed to a chair (including a wheelchair)?: A Little Help needed standing up from a chair using your arms (e.g., wheelchair or bedside chair)?: A Little Help needed to walk in hospital room?: A Little Help needed climbing 3-5 steps with a railing? : A Lot 6 Click Score: 17    End of Session Equipment Utilized During Treatment: Gait belt Activity Tolerance: Patient tolerated treatment well Patient left: in bed;with call bell/phone within reach;with bed alarm set;with family/visitor present;with nursing/sitter in room Nurse Communication: Mobility status PT Visit Diagnosis: Unsteadiness on feet (R26.81);Other abnormalities of gait and mobility (R26.89);Muscle weakness (generalized) (M62.81)    Time: 8793-8752 PT Time Calculation (min) (ACUTE ONLY): 41 min   Charges:   PT Evaluation $PT Eval Low Complexity: 1 Low PT Treatments $Gait Training: 8-22 mins PT General Charges $$ ACUTE PT VISIT: 1 Visit         Leontine Hilt, SPT Acute Rehab [phone]   Leontine Hilt 03/18/2024, 2:22 PM

## 2024-03-18 NOTE — Plan of Care (Signed)

## 2024-03-20 ENCOUNTER — Ambulatory Visit: Payer: Self-pay | Admitting: Gastroenterology

## 2024-03-21 LAB — SURGICAL PATHOLOGY

## 2024-03-31 ENCOUNTER — Ambulatory Visit: Payer: Medicare (Managed Care) | Admitting: Cardiology

## 2024-05-11 ENCOUNTER — Ambulatory Visit: Payer: Medicare (Managed Care) | Admitting: Internal Medicine

## 2024-05-24 NOTE — Progress Notes (Deleted)
 Cardiology Office Note:  .   Date:  05/24/2024  ID:  Krystal Maddox, DOB 1941/04/12, MRN [id number] PCP: Cloria Annabella CROME, DO  [facility] Providers Cardiologist:  Aleene Passe, MD (Inactive) { Click to update primary MD,subspecialty MD or APP then REFRESH:1}   History of Present Illness: .   Krystal Maddox is a 83 y.o. female   with a hx of HTN, CKD stage III, PAD, pericarditis, and severe aortic stenosis s/p TAVR (08/13/21) Cardiac catheterization performed 05/17/21 showed moderate mid RCA stenosis however did not appear to be flow limiting with mild calcified proximal LAD stenosis and severe aortic stenosis with mean gradient 46.2 mmHg, peak to peak gradient 51 mmHg, AVA 0.76 cm2). There were normal right and left heart pressures.  Duscharged 03/18/24 with acute hyponatremia Na 109 on hydrochlorothiazide , anemia transfused 1 unit PRBC. Echo showed mod to severe MR 03/2024. Echo 09/2022 trivial MR.    ROS: ***  Studies Reviewed: SABRA         Prior CV Studies: {Select studies to display:26339}   EIMPRESSIONS     1. S/P TAVR with mean gradient 7 mmHg and no AI; moderate to severe MR  (new compared to previous).   2. Left ventricular ejection fraction, by estimation, is 60 to 65%. The  left ventricle has normal function. The left ventricle has no regional  wall motion abnormalities. Left ventricular diastolic parameters are  indeterminate.   3. Right ventricular systolic function is normal. The right ventricular  size is normal.   4. The mitral valve is normal in structure. Moderate to severe mitral  valve regurgitation. No evidence of mitral stenosis.   5. The aortic valve has been repaired/replaced. Aortic valve  regurgitation is not visualized. No aortic stenosis is present. There is a  26 mm Medtronic stented TAVR valve present in the aortic position.  Procedure Date: 08/13/21.   6. The inferior vena cava is normal in size with greater than 50%  respiratory variability,  suggesting right atrial pressure of 3 mmHg.cho 03/2024  Echo 09/12/22  IMPRESSIONS   1. Left ventricular ejection fraction, by estimation, is 60 to 65%. Left  ventricular ejection fraction by 3D volume is 61 %. The left ventricle has  normal function. The left ventricle has no regional wall motion  abnormalities. Left ventricular diastolic   parameters are consistent with Grade I diastolic dysfunction (impaired  relaxation). Elevated left ventricular end-diastolic pressure.   2. Right ventricular systolic function is normal. The right ventricular  size is normal. There is mildly elevated pulmonary artery systolic  pressure. The estimated right ventricular systolic pressure is 37.8 mmHg.   3. The mitral valve is normal in structure. Trivial mitral valve  regurgitation. No evidence of mitral stenosis.   4. The aortic valve has been repaired/replaced. Aortic valve  regurgitation is not visualized. No aortic stenosis is present. There is a  26 mm Medtronic stented (TAVR) valve present in the aortic position.  Procedure Date: 08/13/2021. Aortic valve area, by   VTI measures 1.82 cm. Aortic valve mean gradient measures 9.0 mmHg.  Aortic valve Vmax measures 2.06 m/s. DVI 0.58 VMax 2.70m/s   5. The inferior vena cava is normal in size with greater than 50%  respiratory variability, suggesting right atrial pressure of 3 mmHg.      Risk Assessment/Calculations:   {Does this patient have ATRIAL FIBRILLATION?:[phone]} No BP recorded.  {Refresh Note OR Click here to enter BP  :1}***       Physical  Exam:   VS:  There were no vitals taken for this visit.   Orhtostatics: No data found. Wt Readings from Last 3 Encounters:  03/18/24 150 lb 9.2 oz (68.3 kg)  01/18/24 152 lb 12.8 oz (69.3 kg)  01/13/23 148 lb (67.1 kg)    GEN: Well nourished, well developed in no acute distress NECK: No JVD; No carotid bruits CARDIAC: ***RRR, no murmurs, rubs, gallops RESPIRATORY:  Clear to auscultation without  rales, wheezing or rhonchi  ABDOMEN: Soft, non-tender, non-distended EXTREMITIES:  No edema; No deformity   ASSESSMENT AND PLAN: .    Severe AS s/p TAVR: echo today shows EF 60%, normally functioning TAVR with a mean gradient of 9 mm hg and no PVL. She has NYHA class I symptoms. SBE prophylaxis discussed; I have RX'd amoxicillin . Continue on aspirin . Continue on aspirin  alone (given samples today). She will continue regular follow up with Dr. Alveta.   Mod-severe MR echo 03/2024(trivial 2024) in setting of hyponatremia, anemia.   HTN: Bp well controlled. No changes made.    CKD stage IIIa: labs followed by PCP       {Are you ordering a CV Procedure (e.g. stress test, cath, DCCV, TEE, etc)?   Press F2        :[id number]}  Dispo: ***  Signed, Olivia Pavy, PA-C

## 2024-05-31 ENCOUNTER — Ambulatory Visit: Payer: Medicare (Managed Care) | Attending: Physician Assistant | Admitting: Physician Assistant

## 2024-05-31 ENCOUNTER — Ambulatory Visit: Payer: Medicare (Managed Care) | Admitting: Physician Assistant

## 2024-05-31 ENCOUNTER — Other Ambulatory Visit (HOSPITAL_COMMUNITY): Payer: Self-pay

## 2024-05-31 VITALS — BP 148/60 | HR 91 | Ht 61.0 in | Wt 148.4 lb

## 2024-05-31 DIAGNOSIS — N1831 Chronic kidney disease, stage 3a: Secondary | ICD-10-CM | POA: Diagnosis not present

## 2024-05-31 DIAGNOSIS — Z758 Other problems related to medical facilities and other health care: Secondary | ICD-10-CM

## 2024-05-31 DIAGNOSIS — Z603 Acculturation difficulty: Secondary | ICD-10-CM | POA: Diagnosis not present

## 2024-05-31 DIAGNOSIS — I10 Essential (primary) hypertension: Secondary | ICD-10-CM

## 2024-05-31 DIAGNOSIS — Z952 Presence of prosthetic heart valve: Secondary | ICD-10-CM

## 2024-05-31 DIAGNOSIS — I340 Nonrheumatic mitral (valve) insufficiency: Secondary | ICD-10-CM

## 2024-05-31 MED ORDER — HYDRALAZINE HCL 50 MG PO TABS
50.0000 mg | ORAL_TABLET | Freq: Three times a day (TID) | ORAL | 1 refills | Status: AC
  Filled 2024-05-31: qty 88, 29d supply, fill #0
  Filled 2024-05-31: qty 184, 61d supply, fill #0
  Filled 2024-05-31: qty 4, 1d supply, fill #0
  Filled 2024-05-31 (×2): qty 86, 29d supply, fill #0

## 2024-05-31 MED ORDER — HYDRALAZINE HCL 50 MG PO TABS: 50.0000 mg | ORAL_TABLET | Freq: Three times a day (TID) | ORAL | 1 refills | Status: DC

## 2024-05-31 NOTE — Progress Notes (Signed)
 Cardiology Office Note:  .   Date:  05/31/2024  ID:  Krystal Maddox, DOB Feb 05, 1941, MRN [id number] PCP: Cloria Annabella CROME, DO  [facility] Providers Cardiologist:  Aleene Passe, MD (Inactive)    History of Present Illness: .   Krystal Maddox is a 83 y.o. female with history of HTN, CKD stage 3 AS, S/) TAVR 02/2021 Cardiac catheterization performed 05/17/21 showed moderate mid RCA stenosis however did not appear to be flow limiting with mild calcified proximal LAD stenosis and severe aortic stenosis with mean gradient 46.2 mmHg, peak to peak gradient 51 mmHg, AVA 0.76 cm2). There were normal right and left heart pressures.  Hospitalized 03/2024 for hyponatremia secondary to hypervolemia and hydrochlorothiazide. On lasix  prn per renal. Echo mod to severe MR.  Patient seen today with interpreter Ybion on the phone [phone]. She denies chest pain, dyspnea, palpitations, edema. Complaining of stomach pain and leg bleeding. She says her heart is doing ok. She says someone brought her a new medication and she doesn't know what it's for. She says she doesn't have a primary Dr. She is getting extra salt in her diet.    ROS:    Studies Reviewed: SABRA         Prior CV Studies:    Echo 03/2024 IMPRESSIONS     1. S/P TAVR with mean gradient 7 mmHg and no AI; moderate to severe MR  (new compared to previous).   2. Left ventricular ejection fraction, by estimation, is 60 to 65%. The  left ventricle has normal function. The left ventricle has no regional  wall motion abnormalities. Left ventricular diastolic parameters are  indeterminate.   3. Right ventricular systolic function is normal. The right ventricular  size is normal.   4. The mitral valve is normal in structure. Moderate to severe mitral  valve regurgitation. No evidence of mitral stenosis.   5. The aortic valve has been repaired/replaced. Aortic valve  regurgitation is not visualized. No aortic stenosis is present. There is a  26  mm Medtronic stented TAVR valve present in the aortic position.  Procedure Date: 08/13/21.   6. The inferior vena cava is normal in size with greater than 50%  respiratory variability, suggesting right atrial pressure of 3 mmHg.    Echo 09/12/22  IMPRESSIONS   1. Left ventricular ejection fraction, by estimation, is 60 to 65%. Left  ventricular ejection fraction by 3D volume is 61 %. The left ventricle has  normal function. The left ventricle has no regional wall motion  abnormalities. Left ventricular diastolic   parameters are consistent with Grade I diastolic dysfunction (impaired  relaxation). Elevated left ventricular end-diastolic pressure.   2. Right ventricular systolic function is normal. The right ventricular  size is normal. There is mildly elevated pulmonary artery systolic  pressure. The estimated right ventricular systolic pressure is 37.8 mmHg.   3. The mitral valve is normal in structure. Trivial mitral valve  regurgitation. No evidence of mitral stenosis.   4. The aortic valve has been repaired/replaced. Aortic valve  regurgitation is not visualized. No aortic stenosis is present. There is a  26 mm Medtronic stented (TAVR) valve present in the aortic position.  Procedure Date: 08/13/2021. Aortic valve area, by   VTI measures 1.82 cm. Aortic valve mean gradient measures 9.0 mmHg.  Aortic valve Vmax measures 2.06 m/s. DVI 0.58 VMax 2.72m/s   5. The inferior vena cava is normal in size with greater than 50%  respiratory variability, suggesting right atrial pressure  of 3 mmHg.      Risk Assessment/Calculations:     HYPERTENSION CONTROL Vitals:   05/31/24 0919 05/31/24 0944  BP: (!) 152/86 (!) 148/60    The patient's blood pressure is elevated above target today.  In order to address the patient's elevated BP: Blood pressure will be monitored at home to determine if medication changes need to be made.; A current anti-hypertensive medication was adjusted today.; Follow  up with primary care provider for management.          Physical Exam:   VS:  BP (!) 148/60   Pulse 91   Ht 5' 1 (1.549 m)   Wt 148 lb 6.4 oz (67.3 kg)   SpO2 98%   BMI 28.04 kg/m    Orhtostatics: No data found. Wt Readings from Last 3 Encounters:  05/31/24 148 lb 6.4 oz (67.3 kg)  03/18/24 150 lb 9.2 oz (68.3 kg)  01/18/24 152 lb 12.8 oz (69.3 kg)    GEN: Well nourished, well developed in no acute distress NECK: No JVD; No carotid bruits CARDIAC:  RRR, 3/6 systolic murmur LSB RESPIRATORY:  Clear to auscultation without rales, wheezing or rhonchi  ABDOMEN: Soft, non-tender, non-distended EXTREMITIES:  trace edema; No deformity   ASSESSMENT AND PLAN: .    S/P TAVR 2022 SBE prophylaxis   HTN-BP running high. Will increase 50 mg tid 2 gm sodium diet.  Moderate to severe MR-will monitor  CKD stage 3-says she had blood work since hospital   Language barrier-have to use interpreter on my phone. Will help arrange an appt with her PCP.         Dispo: f/u with cardiologist in 4 months  Signed, Olivia Pavy, PA-C

## 2024-05-31 NOTE — Addendum Note (Signed)
 Addended by: BILLY CAMELIA CROME on: 05/31/2024 10:17 AM   Modules accepted: Orders

## 2024-05-31 NOTE — Patient Instructions (Addendum)
 Medication Instructions:  INCREASE HYDRALAZINE  TO 50 MG THREE TIMES DAILY. CONTINUE ALL OTHER MEDICATION THERAPY.   Lab Work: NONE TO BE DONE TODAY.   Testing/Procedures: NONE  Follow-Up: At [facility], you and your health needs are our priority.  As part of our continuing mission to provide you with exceptional heart care, our providers are all part of one team.  This team includes your primary Cardiologist (physician) and Advanced Practice Providers or APPs (Physician Assistants and Nurse Practitioners) who all work together to provide you with the care you need, when you need it.  Your next appointment:   4 MONTHS  Provider:   DR. GEORGANNA ARCHER, MD   We recommend signing up for the patient portal called MyChart.  Sign up information is provided on this After Visit Summary.  MyChart is used to connect with patients for Virtual Visits (Telemedicine).  Patients are able to view lab/test results, encounter notes, upcoming appointments, etc.  Non-urgent messages can be sent to your provider as well.   To learn more about what you can do with MyChart, go to [email or web address].   Other Instructions:

## 2024-06-22 ENCOUNTER — Ambulatory Visit: Payer: Medicare (Managed Care) | Admitting: Physician Assistant

## 2024-06-28 ENCOUNTER — Ambulatory Visit: Payer: Medicare (Managed Care) | Admitting: Physician Assistant

## 2024-08-19 ENCOUNTER — Ambulatory Visit: Payer: Medicare (Managed Care)

## 2024-08-19 VITALS — BP 190/74 | HR 88 | Ht 61.0 in | Wt 144.3 lb

## 2024-08-19 DIAGNOSIS — N1831 Chronic kidney disease, stage 3a: Secondary | ICD-10-CM

## 2024-08-19 DIAGNOSIS — I340 Nonrheumatic mitral (valve) insufficiency: Secondary | ICD-10-CM

## 2024-08-19 DIAGNOSIS — I739 Peripheral vascular disease, unspecified: Secondary | ICD-10-CM

## 2024-08-19 DIAGNOSIS — I10 Essential (primary) hypertension: Secondary | ICD-10-CM | POA: Diagnosis not present

## 2024-08-19 DIAGNOSIS — Z952 Presence of prosthetic heart valve: Secondary | ICD-10-CM

## 2024-08-19 MED ORDER — LOSARTAN POTASSIUM 25 MG PO TABS: 25.0000 mg | ORAL_TABLET | Freq: Every day | ORAL | 3 refills | Status: DC

## 2024-08-19 NOTE — Patient Instructions (Signed)
 Medication Instructions:  START losartan  25mg  once daily for blood pressure  *If you need a refill on your cardiac medications before your next appointment, please call your pharmacy*  Lab Work: Fasting lab work in 2 weeks -- same day as BP check appointment   ** no food or drink for at least 8 hours for labs ** water  is OK ** you can take normal medications  If you have labs (blood work) drawn today and your tests are completely normal, you will receive your results only by: MyChart Message (if you have MyChart) OR A paper copy in the mail If you have any lab test that is abnormal or we need to change your treatment, we will call you to review the results.  Testing/Procedures: Your physician has requested that you have an echocardiogram in June 2026. Echocardiography is a painless test that uses sound waves to create images of your heart. It provides your doctor with information about the size and shape of your heart and how well your hearts chambers and valves are working. This procedure takes approximately one hour. There are no restrictions for this procedure. Please do NOT wear cologne, perfume, aftershave, or lotions (deodorant is allowed). Please arrive 15 minutes prior to your appointment time.  Please note: We ask at that you not bring children with you during ultrasound (echo/ vascular) testing. Due to room size and safety concerns, children are not allowed in the ultrasound rooms during exams. Our front office staff cannot provide observation of children in our lobby area while testing is being conducted. An adult accompanying a patient to their appointment will only be allowed in the ultrasound room at the discretion of the ultrasound technician under special circumstances. We apologize for any inconvenience.  Your physician has requested that you have an ankle brachial index (ABI) in June 2026. During this test an ultrasound and blood pressure cuff are used to evaluate the  arteries that supply the arms and legs with blood. Allow thirty minutes for this exam. There are no restrictions or special instructions.  Please note: We ask at that you not bring children with you during ultrasound (echo/ vascular) testing. Due to room size and safety concerns, children are not allowed in the ultrasound rooms during exams. Our front office staff cannot provide observation of children in our lobby area while testing is being conducted. An adult accompanying a patient to their appointment will only be allowed in the ultrasound room at the discretion of the ultrasound technician under special circumstances. We apologize for any inconvenience.   Follow-Up: At [facility], you and your health needs are our priority.  As part of our continuing mission to provide you with exceptional heart care, our providers are all part of one team.  This team includes your primary Cardiologist (physician) and Advanced Practice Providers or APPs (Physician Assistants and Nurse Practitioners) who all work together to provide you with the care you need, when you need it.  Your next appointment:    2 weeks for BP check appointment with clinical pharmacist  ** bring your medications to this visit   6 months with Dr. Ren Ny -- after echo/ABI testing   We recommend signing up for the patient portal called MyChart.  Sign up information is provided on this After Visit Summary.  MyChart is used to connect with patients for Virtual Visits (Telemedicine).  Patients are able to view lab/test results, encounter notes, upcoming appointments, etc.  Non-urgent messages can be sent to your  provider as well.   To learn more about what you can do with MyChart, go to [email or web address].

## 2024-08-19 NOTE — Progress Notes (Unsigned)
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 female who presents for follow up.  She reports feeling well overall. She reports sore throat but denies any CP, dyspnea, orthopnea, PND or LE edema. She reports some lower back pain and plantar pain bilaterally along with weak legs/knee. She denies any claudication. She is not sure if she has been taking her lasix , and not sure what she is taking. She does not check her BP at home, but had it checked at [facility] earlier today and SBP at 150. She reports adherence to her BP meds.  ROS: Please see the history of present illness. All other systems are reviewed and negative.   PHYSICAL EXAM: VS:  BP (!) 190/74   Pulse 88   Ht 5' 1 (1.549 m)   Wt 144 lb 4.8 oz (65.5 kg)   SpO2 94%   BMI 27.27 kg/m  , BMI Body mass index is 27.27 kg/m. GEN: Well nourished, well developed, in no acute distress HEENT: normal Neck: no JVD, carotid bruits, or masses Cardiac: RRR; no murmurs, rubs, or gallops,no edema  Respiratory:  CTAB bilaterally, normal work of breathing GI: soft, nontender, nondistended, + BS Extremities: No LE edema Skin: warm and dry, no rash Neuro:  Strength and sensation are intact  Recent Labs:  Reviewed  Studies: Reviewed  ASSESSMENT AND PLAN: Krystal Maddox is a 83 y.o. female who presents for follow up. - Appears euvolemic today. Unclear whether patient is on diuretics but denies any HF symptoms. Will repeat TTE in 6 months to assess MR. - BP elevated with SBP 190. Does not check BP at home. Will add losartan  25 mg daily and check BMP/Mag in 2 weeks. - I asked patient to bring all her medications to the PharmD visit in 2 weeks given that we are not confident regarding her med list. - We will also obtain lipid panel in 2 weeks, and ABI in 6 months given US  w/ 50-74% SFA stenosis back in 2021. - Follow up with me in 6 months.   Signed, Joelle VEAR Ren Donley, MD  08/19/2024 3:31 PM    [facility]

## 2024-08-31 NOTE — Progress Notes (Signed)
 "    09/05/2024 Krystal Maddox [id number] [birth year]  Referring provider: Cloria Annabella CROME, DO Primary GI doctor: Dr. Federico  ASSESSMENT AND PLAN:  IDA with colonic AVM, gastritis but no definitive source of bleeding FOBT + stool 03/12/2024 03/17/2024  HGB 9.2 MCV 74.4 Platelets 533 03/12/2024 Iron 9 Ferritin 46 B12 2,263 Recent Labs    03/12/24 1346 03/12/24 2312 03/13/24 0418 03/13/24 1349 03/14/24 0413 03/15/24 0331 03/16/24 0220 03/17/24 0903  HGB 5.7* 7.0* 6.8* 8.3* 8.2* 8.7* 8.9* 9.2*  03/17/2024 EGD (Dr. Legrand) normal esophagus, gastritis, normal duodenum.  There was focal erosion on cardia side of EGJ with friability other areas had contact bleeding with minor scope contact nothing amendable to endoscopic therapy. 03/17/2024 colonoscopy (Dr. 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 Pylori - will get hemoccult cards, declines rectal exam - recheck CBC, CMET, iron, ferritin - if continuing iron def, will refer to  hematology for supportive care, may need IV iron if having worsening constipation and stomach upset with oral iron - pending labs can consider VCE to evaluate anemia  Constipation with AB pain since living at [facility] and possibly due to iron use 03/17/2024 CTAP W gallstone without evidence of cholecystitis no biliary duct dilation normal liver, pancreas, spleen, stomach, bowel.  Endometrial stripe up to 8 mm correlate with pelvic ultrasound 03/12/2024 unremarkable liver 03/17/2024 EGD gastritis with friability negative H. pylori, celiac miralax  caused diarrhea Likely multifactorial constipation due to medications, redundant colon, pelvic floor - will do bowel purge and then start on  linzess 72 mcg daily with benefiber - possible pelvic floor component with decreased rectal tone, consider referral, add on fiber  GERD with nausea, some previous black stool 03/17/2024 EGD gastritis with friability negative H. pylori, celiac Denies ETOH or NSAIDS -Continue pantoprazole  40 mg BID  CKD GFR 54 in the hospital Possibly contributing to anemia  PAD Medically controlled  Moderate to severe MR  AS status post TAVR December 2022 03/16/2024 echo during admission shows new moderate to severe MR, EF 60 to 65%, normal RSVP normal TAVR Follows Dr. Ren Ny, last seen 08/19/2024 plans for follow-up TTE 6 months. On lasix  and states her swelling has improved She denies SOB, CP, lungs CTAB Any procedures will need to be at the hospital  Mild pulmonary hypertension 03/16/2024 echo normal RSVP  *Due to language barrier, an interpreter was present during the history-taking and subsequent discussion (and for part of the physical exam) with this patient.   Patient Care Team: Cloria Annabella CROME, DO as PCP - General (Geriatric Medicine) Nahser, Aleene PARAS, MD (Inactive) as PCP - Cardiology (Cardiology) Janey Santos, MD (Internal Medicine)  HISTORY OF PRESENT ILLNESS: 83 y.o. non-English-speaking [place] female with a past medical history of CKD, PAD, AS s/p TAVR, osteoporosis, mild pulm HTN and others listed below presents for evaluation of IDA.   She presented to the ER at [facility] 03/12/2024 with abdominal pain nausea sickness having severe hyponatremia and chest x-ray consistent with acute pulmonary edema. Our team saw her for the first time during that admission for microcytic anemia and positive FOBT.  Discussed the use of AI scribe software for clinical note transcription with the patient, who gave verbal consent to proceed.  History of Present Illness  Krystal Maddox is an 83 year old female with iron deficiency anemia and chronic idiopathic constipation who presents for  hospital follow-up of anemia.  Since her recent hospitalization, she has been taking oral iron supplementation and continues pantoprazole  40 mg twice daily. She also takes Lasix  for mitral regurgitation, with improvement in related symptoms. No shortness of breath or chest pain reported.  No dark or black stools, blood in the stool, nausea, vomiting, or heartburn. Occasional epigastric burning is present. Prior esophagogastroduodenoscopy showed friability but was negative for H. pylori and celiac disease, with no ulcers identified. Colonoscopy revealed a single colonic arteriovenous malformation and polyps, otherwise unremarkable. No clear source of gastrointestinal bleeding has been identified. She does not use alcohol or NSAIDs.  Her primary concern is worsening constipation, which she attributes to cardiac medications, decreased mobility, redundant colon, Lasix , and pelvic floor dysfunction. She describes very hard stools and straining with bowel movements. Previous use of Miralax  resulted in diarrhea and stool incontinence. Current regimen includes a daily fiber supplement and Senokot at night. No weight loss reported.        She  reports that she has an unknown smoking status. She has never used smokeless tobacco. She reports that she does not drink alcohol and does not use drugs.  RELEVANT GI HISTORY, IMAGING AND LABS: Results   Diagnostic Echocardiogram: Moderate to severe mitral regurgitation EGD: Mucosal friability; negative for Helicobacter pylori and celiac disease; no ulcers Colonoscopy: Single colonic arteriovenous malformation; polyps otherwise unremarkable; no clear source of bleeding      CBC    Component Value Date/Time   WBC 9.7 03/17/2024 0903   RBC 4.03 03/17/2024 0903   HGB 9.2 (L) 03/17/2024 0903   HGB 7.6 (L) 10/11/2021 1216   HCT 30.0 (L) 03/17/2024 0903   HCT 25.8 (L) 10/11/2021 1216   PLT 533 (H) 03/17/2024 0903   PLT 401 10/11/2021 1216   MCV 74.4 (L)  03/17/2024 0903   MCV 84 10/11/2021 1216   MCH 22.8 (L) 03/17/2024 0903   MCHC 30.7 03/17/2024 0903   RDW 20.4 (H) 03/17/2024 0903   RDW 14.1 10/11/2021 1216   LYMPHSABS 1.5 03/12/2024 1346   MONOABS 1.0 03/12/2024 1346   EOSABS 0.0 03/12/2024 1346   BASOSABS 0.0 03/12/2024 1346   Recent Labs    03/12/24 1346 03/12/24 2312 03/13/24 0418 03/13/24 1349 03/14/24 0413 03/15/24 0331 03/16/24 0220 03/17/24 0903  HGB 5.7* 7.0* 6.8* 8.3* 8.2* 8.7* 8.9* 9.2*    CMP     Component Value Date/Time   NA 133 (L) 03/18/2024 0219   NA 137 01/18/2024 1020   K 4.6 03/18/2024 0219   CL 100 03/18/2024 0219   CO2 21 (L) 03/18/2024 0219   GLUCOSE 102 (H) 03/18/2024 0219   BUN 15 03/18/2024 0219   BUN 23 01/18/2024 1020   CREATININE 1.03 (H) 03/18/2024 0219   CALCIUM 8.7 (L) 03/18/2024 0219   PROT 7.5 03/12/2024 1346   PROT 8.1 01/10/2021 1141   ALBUMIN 2.7 (L) 03/17/2024 0903   ALBUMIN 4.1 01/10/2021 1141   AST 33 03/12/2024 1346   ALT 31 03/12/2024 1346   ALKPHOS 115 03/12/2024 1346   BILITOT 0.5 03/12/2024 1346   BILITOT 0.2 01/10/2021 1141   GFRNONAA 54 (L) 03/18/2024 0219      Latest Ref Rng & Units 03/17/2024    9:03 AM 03/12/2024    1:46 PM 09/09/2021    5:03 PM  Hepatic Function  Total Protein 6.5 - 8.1 g/dL  7.5  7.8   Albumin 3.5 - 5.0 g/dL 2.7  2.7  3.3   AST 15 - 41 U/L  33  20   ALT 0 - 44 U/L  31  12   Alk Phosphatase 38 - 126 U/L  115  76   Total Bilirubin 0.0 - 1.2 mg/dL  0.5  0.7       Current Medications:   Current Outpatient Medications (Endocrine & Metabolic):    glipiZIDE (GLUCOTROL) 5 MG tablet, Take 5 mg by mouth 2 (two) times daily before a meal.   dextrose  (GLUTOSE) 40 % GEL, Take 1 Tube by mouth every 15 (fifteen) minutes as needed for low blood sugar. (Patient not taking: Reported on 09/05/2024)   insulin  aspart (NOVOLOG ) 100 UNIT/ML injection, Inject 10 Units into the skin See admin instructions. For CBG greater than 400   linagliptin (TRADJENTA) 5  MG TABS tablet, Take 5 mg by mouth daily.  Current Outpatient Medications (Cardiovascular):    amLODipine  (NORVASC ) 10 MG tablet, Take 10 mg by mouth daily.    furosemide  (LASIX ) 20 MG tablet, Take 1 tablet (20 mg total) by mouth daily. Take 1 extra tablet (20mg ) at night if swelling is present   hydrALAZINE  (APRESOLINE ) 50 MG tablet, Take 1 tablet (50 mg total) by mouth 3 (three) times daily.   losartan  (COZAAR ) 25 MG tablet, Take 1 tablet (25 mg total) by mouth daily.   pravastatin  (PRAVACHOL ) 80 MG tablet, Take 80 mg by mouth every evening. (Patient not taking: Reported on 09/05/2024)  Current Outpatient Medications (Analgesics):    aspirin  EC 81 MG tablet, Take 81 mg by mouth daily.  Current Outpatient Medications (Hematological):    Iron-Vitamin C (VITRON-C PO), Take 1 tablet by mouth daily in the afternoon.   ferrous sulfate  325 (65 FE) MG tablet, Take 325 mg by mouth daily. (Patient not taking: Reported on 09/05/2024)  Current Outpatient Medications (Other):    Benzocaine-Menthol  (ORAJEL 2X TOOTHACHE & GUM) 20-0.26 % GEL, Use as directed 1 Application in the mouth or throat 3 (three) times daily as needed (painful gum).   Calcium Carbonate-Vit D-Min (GNP CALCIUM 600 +D/MINERALS) 600-800 MG-UNIT TABS, Take 1 tablet by mouth in the morning and at bedtime.   Camphor-Menthol  (ARCTIC RELIEF PAIN RELIEVING) 0.2-3.5 % GEL, Apply 1 Application topically 4 (four) times daily as needed (joint or muscle pain).   linaclotide (LINZESS) 72 MCG capsule, Take 1 capsule (72 mcg total) by mouth daily before breakfast.   Multiple Vitamins-Minerals (PRESERVISION AREDS 2 PO), Take 1 tablet by mouth in the morning and at bedtime.   pantoprazole  (PROTONIX ) 40 MG tablet, Take 1 tablet (40 mg total) by mouth 2 (two) times daily.   Polyethyl Glyc-Propyl Glyc PF (SYSTANE HYDRATION PF) 0.4-0.3 % SOLN, Place 1 drop into both eyes in the morning, at noon, in the evening, and at bedtime.   senna-docusate (SENOKOT-S)  8.6-50 MG tablet, Take 2 tablets by mouth at bedtime.   sodium bicarbonate 650 MG tablet, Take 650 mg by mouth 2 (two) times daily.   White Petrolatum-Mineral Oil (SYSTANE NIGHTTIME) OINT, Place 1 Application into both eyes at bedtime.   amoxicillin  (AMOXIL ) 500 MG tablet, Take 4 tablets by mouth 1 hour prior to dental procedures and cleanings (Patient not taking: Reported on 09/05/2024)   chlorhexidine  (PERIDEX ) 0.12 % solution, Use as directed 15 mLs in the mouth or throat every evening. Swish for 30 seconds and spit (Patient not taking: Reported on 09/05/2024)   docusate sodium  (  COLACE) 100 MG capsule, Take 1 capsule (100 mg total) by mouth 2 (two) times daily as needed for mild constipation. (Patient not taking: Reported on 09/05/2024)   Menthol -Methyl Salicylate  (MUSCLE RUB) 10-15 % CREA, Apply 1 Application topically 4 (four) times daily as needed (joint/muscle pain).   mineral oil-hydrophilic petrolatum (AQUAPHOR) ointment, Apply 1 Application topically 2 (two) times daily as needed for dry skin.   polyethylene glycol (MIRALAX  / GLYCOLAX ) 17 g packet, Take 17 g by mouth daily as needed for moderate constipation. (Patient not taking: Reported on 09/05/2024)   psyllium (HYDROCIL/METAMUCIL) 95 % PACK, Take 1 packet by mouth daily. (Patient not taking: Reported on 09/05/2024)  Medical History:  Past Medical History:  Diagnosis Date   CKD (chronic kidney disease), stage III (HCC)    Hypertension    Onychomycosis    Osteoporosis    Peripheral arterial disease    OF LEFT EXTREMITY    Primary osteoarthritis of right knee 05/26/2017   S/P TAVR (transcatheter aortic valve replacement) 08/13/2021   s/p TAVR with a 26mm Medtronic Evolut Pro+ via the TF approach by Dr. Verlin and Dr Lucas.   Severe aortic stenosis    Superficial vein thrombosis 05/21/2021   R arm   Vitamin D deficiency    Allergies: Allergies[1]   Surgical History:  She  has a past surgical history that includes Knee  surgery (Left); RIGHT/LEFT HEART CATH AND CORONARY ANGIOGRAPHY (N/A, 05/17/2021); Eye surgery (Left, 2022); Transcatheter aortic valve replacement, transfemoral (N/A, 08/13/2021); Intraoperative transthoracic echocardiogram (N/A, 08/13/2021); Colonoscopy (N/A, 03/17/2024); and Esophagogastroduodenoscopy (N/A, 03/17/2024). Family History:  Her family history includes Hypertension in her mother.  REVIEW OF SYSTEMS  : All other systems reviewed and negative except where noted in the History of Present Illness.  PHYSICAL EXAM: BP 136/60   Pulse 82   Ht 5' 1 (1.549 m)   Wt 143 lb (64.9 kg)   SpO2 97%   BMI 27.02 kg/m  Physical Exam   GENERAL APPEARANCE: Well nourished, in no apparent distress HEENT: No cervical lymphadenopathy, unremarkable thyroid, sclerae anicteric, conjunctiva pink RESPIRATORY: Respiratory effort normal, BS equal bilateral without rales, rhonchi, wheezing CARDIO: RRR with systolic murmur, peripheral pulses intact ABDOMEN: Soft, non distended, active bowel sounds in all 4 quadrants, no tenderness to palpation, no rebound, no mass appreciated RECTAL: declines MUSCULOSKELETAL: Full ROM, antalgic gait with cane, without edema SKIN: Dry, intact without rashes or lesions. No jaundice. NEURO: Alert, oriented, no focal deficits PSYCH: Cooperative, normal mood and affect.      Alan JONELLE Coombs, PA-C 11:43 AM      [1]  Allergies Allergen Reactions   Hydrochlorothiazide Other (See Comments)    Severe hyponatremia    Tylenol  [Acetaminophen ]     Pt reports long time ago she got sick on tylenol  she not sure what happen but takes Tylenol  now.   "

## 2024-09-01 ENCOUNTER — Ambulatory Visit: Payer: Medicare (Managed Care) | Admitting: Physician Assistant

## 2024-09-05 ENCOUNTER — Telehealth: Payer: Self-pay

## 2024-09-05 ENCOUNTER — Inpatient Hospital Stay (HOSPITAL_COMMUNITY)
Admission: EM | Admit: 2024-09-05 | Discharge: 2024-09-08 | DRG: 378 | Disposition: A | Discharge status: Home / Self Care | Payer: Medicare (Managed Care) | Attending: Internal Medicine | Admitting: Internal Medicine

## 2024-09-05 ENCOUNTER — Ambulatory Visit: Payer: Medicare (Managed Care) | Admitting: Physician Assistant

## 2024-09-05 ENCOUNTER — Encounter: Payer: Self-pay | Admitting: Physician Assistant

## 2024-09-05 ENCOUNTER — Ambulatory Visit: Payer: Self-pay | Admitting: Physician Assistant

## 2024-09-05 ENCOUNTER — Encounter (HOSPITAL_COMMUNITY): Payer: Self-pay | Admitting: Emergency Medicine

## 2024-09-05 ENCOUNTER — Other Ambulatory Visit: Payer: Medicare (Managed Care)

## 2024-09-05 VITALS — BP 136/60 | HR 82 | Ht 61.0 in | Wt 143.0 lb

## 2024-09-05 DIAGNOSIS — D509 Iron deficiency anemia, unspecified: Secondary | ICD-10-CM | POA: Diagnosis not present

## 2024-09-05 DIAGNOSIS — D75839 Thrombocytosis, unspecified: Secondary | ICD-10-CM | POA: Diagnosis present

## 2024-09-05 DIAGNOSIS — K2970 Gastritis, unspecified, without bleeding: Secondary | ICD-10-CM

## 2024-09-05 DIAGNOSIS — K5520 Angiodysplasia of colon without hemorrhage: Secondary | ICD-10-CM

## 2024-09-05 DIAGNOSIS — Z888 Allergy status to other drugs, medicaments and biological substances status: Secondary | ICD-10-CM

## 2024-09-05 DIAGNOSIS — K5900 Constipation, unspecified: Secondary | ICD-10-CM

## 2024-09-05 DIAGNOSIS — M810 Age-related osteoporosis without current pathological fracture: Secondary | ICD-10-CM | POA: Diagnosis present

## 2024-09-05 DIAGNOSIS — N1832 Chronic kidney disease, stage 3b: Secondary | ICD-10-CM | POA: Diagnosis present

## 2024-09-05 DIAGNOSIS — I10 Essential (primary) hypertension: Secondary | ICD-10-CM | POA: Diagnosis present

## 2024-09-05 DIAGNOSIS — Z860101 Personal history of adenomatous and serrated colon polyps: Secondary | ICD-10-CM

## 2024-09-05 DIAGNOSIS — N179 Acute kidney failure, unspecified: Secondary | ICD-10-CM | POA: Diagnosis present

## 2024-09-05 DIAGNOSIS — Z79899 Other long term (current) drug therapy: Secondary | ICD-10-CM

## 2024-09-05 DIAGNOSIS — K921 Melena: Secondary | ICD-10-CM

## 2024-09-05 DIAGNOSIS — E1151 Type 2 diabetes mellitus with diabetic peripheral angiopathy without gangrene: Secondary | ICD-10-CM | POA: Diagnosis present

## 2024-09-05 DIAGNOSIS — K219 Gastro-esophageal reflux disease without esophagitis: Secondary | ICD-10-CM

## 2024-09-05 DIAGNOSIS — Z953 Presence of xenogenic heart valve: Secondary | ICD-10-CM

## 2024-09-05 DIAGNOSIS — Z8249 Family history of ischemic heart disease and other diseases of the circulatory system: Secondary | ICD-10-CM

## 2024-09-05 DIAGNOSIS — I129 Hypertensive chronic kidney disease with stage 1 through stage 4 chronic kidney disease, or unspecified chronic kidney disease: Secondary | ICD-10-CM | POA: Diagnosis present

## 2024-09-05 DIAGNOSIS — D72829 Elevated white blood cell count, unspecified: Secondary | ICD-10-CM | POA: Diagnosis present

## 2024-09-05 DIAGNOSIS — Z794 Long term (current) use of insulin: Secondary | ICD-10-CM

## 2024-09-05 DIAGNOSIS — Z860100 Personal history of colon polyps, unspecified: Secondary | ICD-10-CM

## 2024-09-05 DIAGNOSIS — I272 Pulmonary hypertension, unspecified: Secondary | ICD-10-CM | POA: Diagnosis present

## 2024-09-05 DIAGNOSIS — E1122 Type 2 diabetes mellitus with diabetic chronic kidney disease: Secondary | ICD-10-CM | POA: Diagnosis present

## 2024-09-05 DIAGNOSIS — D649 Anemia, unspecified: Principal | ICD-10-CM | POA: Diagnosis present

## 2024-09-05 DIAGNOSIS — I340 Nonrheumatic mitral (valve) insufficiency: Secondary | ICD-10-CM | POA: Diagnosis present

## 2024-09-05 DIAGNOSIS — I739 Peripheral vascular disease, unspecified: Secondary | ICD-10-CM | POA: Diagnosis present

## 2024-09-05 DIAGNOSIS — Z886 Allergy status to analgesic agent status: Secondary | ICD-10-CM

## 2024-09-05 DIAGNOSIS — E871 Hypo-osmolality and hyponatremia: Secondary | ICD-10-CM | POA: Diagnosis present

## 2024-09-05 DIAGNOSIS — K31811 Angiodysplasia of stomach and duodenum with bleeding: Principal | ICD-10-CM | POA: Diagnosis present

## 2024-09-05 DIAGNOSIS — Z603 Acculturation difficulty: Secondary | ICD-10-CM | POA: Diagnosis present

## 2024-09-05 DIAGNOSIS — Z7984 Long term (current) use of oral hypoglycemic drugs: Secondary | ICD-10-CM

## 2024-09-05 DIAGNOSIS — K5904 Chronic idiopathic constipation: Secondary | ICD-10-CM

## 2024-09-05 DIAGNOSIS — D500 Iron deficiency anemia secondary to blood loss (chronic): Secondary | ICD-10-CM | POA: Diagnosis present

## 2024-09-05 DIAGNOSIS — K5521 Angiodysplasia of colon with hemorrhage: Secondary | ICD-10-CM | POA: Diagnosis present

## 2024-09-05 DIAGNOSIS — K5909 Other constipation: Secondary | ICD-10-CM | POA: Diagnosis present

## 2024-09-05 DIAGNOSIS — Z952 Presence of prosthetic heart valve: Secondary | ICD-10-CM

## 2024-09-05 DIAGNOSIS — D62 Acute posthemorrhagic anemia: Secondary | ICD-10-CM | POA: Diagnosis present

## 2024-09-05 LAB — CBC WITH DIFFERENTIAL/PLATELET
Basophils Absolute: 0.1 K/uL (ref 0.0–0.1)
Basophils Relative: 1.1 % (ref 0.0–3.0)
Eosinophils Absolute: 0.1 K/uL (ref 0.0–0.7)
Eosinophils Relative: 0.9 % (ref 0.0–5.0)
HCT: 22.4 % — CL (ref 36.0–46.0)
Hemoglobin: 6.9 g/dL — CL (ref 12.0–15.0)
Lymphocytes Relative: 13.7 % (ref 12.0–46.0)
Lymphs Abs: 1.6 K/uL (ref 0.7–4.0)
MCHC: 30.8 g/dL (ref 30.0–36.0)
MCV: 71.4 fl — ABNORMAL LOW (ref 78.0–100.0)
Monocytes Absolute: 1 K/uL (ref 0.1–1.0)
Monocytes Relative: 8.3 % (ref 3.0–12.0)
Neutro Abs: 8.7 K/uL — ABNORMAL HIGH (ref 1.4–7.7)
Neutrophils Relative %: 76 % (ref 43.0–77.0)
Platelets: 411 K/uL — ABNORMAL HIGH (ref 150.0–400.0)
RBC: 3.14 Mil/uL — ABNORMAL LOW (ref 3.87–5.11)
RDW: 22.6 % — ABNORMAL HIGH (ref 11.5–15.5)
WBC: 11.5 K/uL — ABNORMAL HIGH (ref 4.0–10.5)

## 2024-09-05 LAB — COMPREHENSIVE METABOLIC PANEL WITH GFR
ALT: 11 U/L (ref 3–35)
ALT: 12 U/L (ref 0–44)
AST: 18 U/L (ref 5–37)
AST: 28 U/L (ref 15–41)
Albumin: 3.7 g/dL (ref 3.5–5.2)
Albumin: 4 g/dL (ref 3.5–5.0)
Alkaline Phosphatase: 61 U/L (ref 39–117)
Alkaline Phosphatase: 75 U/L (ref 38–126)
Anion gap: 12 (ref 5–15)
BUN: 33 mg/dL — ABNORMAL HIGH (ref 6–23)
BUN: 37 mg/dL — ABNORMAL HIGH (ref 8–23)
CO2: 28 meq/L (ref 19–32)
CO2: 26 mmol/L (ref 22–32)
Calcium: 8.7 mg/dL (ref 8.4–10.5)
Calcium: 8.8 mg/dL — ABNORMAL LOW (ref 8.9–10.3)
Chloride: 101 meq/L (ref 96–112)
Chloride: 99 mmol/L (ref 98–111)
Creatinine, Ser: 1.54 mg/dL — ABNORMAL HIGH (ref 0.40–1.20)
Creatinine, Ser: 1.57 mg/dL — ABNORMAL HIGH (ref 0.44–1.00)
GFR, Estimated: 32 mL/min — ABNORMAL LOW
GFR: 30.73 mL/min — ABNORMAL LOW
Glucose, Bld: 155 mg/dL — ABNORMAL HIGH (ref 70–99)
Glucose, Bld: 200 mg/dL — ABNORMAL HIGH (ref 70–99)
Potassium: 3.7 meq/L (ref 3.5–5.1)
Potassium: 3.6 mmol/L (ref 3.5–5.1)
Sodium: 140 meq/L (ref 135–145)
Sodium: 137 mmol/L (ref 135–145)
Total Bilirubin: 0.2 mg/dL (ref 0.2–1.2)
Total Bilirubin: 0.3 mg/dL (ref 0.0–1.2)
Total Protein: 8 g/dL (ref 6.0–8.3)
Total Protein: 8.4 g/dL — ABNORMAL HIGH (ref 6.5–8.1)

## 2024-09-05 LAB — IBC + FERRITIN
Ferritin: 11.8 ng/mL (ref 10.0–291.0)
Iron: 17 ug/dL — ABNORMAL LOW (ref 42–145)
Saturation Ratios: 4.1 % — ABNORMAL LOW (ref 20.0–50.0)
TIBC: 417.2 ug/dL (ref 250.0–450.0)
Transferrin: 298 mg/dL (ref 212.0–360.0)

## 2024-09-05 LAB — PREPARE RBC (CROSSMATCH)

## 2024-09-05 LAB — CBC
HCT: 24 % — ABNORMAL LOW (ref 36.0–46.0)
Hemoglobin: 7.2 g/dL — ABNORMAL LOW (ref 12.0–15.0)
MCH: 22.6 pg — ABNORMAL LOW (ref 26.0–34.0)
MCHC: 30 g/dL (ref 30.0–36.0)
MCV: 75.2 fL — ABNORMAL LOW (ref 80.0–100.0)
Platelets: 452 K/uL — ABNORMAL HIGH (ref 150–400)
RBC: 3.19 MIL/uL — ABNORMAL LOW (ref 3.87–5.11)
RDW: 21.6 % — ABNORMAL HIGH (ref 11.5–15.5)
WBC: 14.4 K/uL — ABNORMAL HIGH (ref 4.0–10.5)
nRBC: 0 % (ref 0.0–0.2)

## 2024-09-05 LAB — POC OCCULT BLOOD, ED: Fecal Occult Blood, POC: NEGATIVE

## 2024-09-05 MED ORDER — SODIUM CHLORIDE 0.9% IV SOLUTION: Freq: Once | INTRAVENOUS | Status: AC

## 2024-09-05 MED ORDER — LINACLOTIDE 72 MCG PO CAPS: 72.0000 ug | ORAL_CAPSULE | Freq: Every day | ORAL | 3 refills | Status: AC

## 2024-09-05 MED ORDER — PANTOPRAZOLE SODIUM 40 MG IV SOLR
40.0000 mg | Freq: Once | INTRAVENOUS | Status: AC
  Administered 2024-09-05: 40 mg via INTRAVENOUS
  Filled 2024-09-05: qty 10

## 2024-09-05 NOTE — Telephone Encounter (Signed)
 Patient with low HGB, please call and advise her to go to the ER for blood product.  Will likely need a VCE and possibly IV iron or referral to hematology.  Please let me know which hospital she will be going to and we can alert our inpatient team.

## 2024-09-05 NOTE — ED Triage Notes (Signed)
 Interpreter at bedside.   Pt received a call to come to ER due to low hgb. Pt denies any pain. Complains of fatigue. Has felt constipated. Denies any blood in stool. Denies chest pain or SOB.

## 2024-09-05 NOTE — Telephone Encounter (Signed)
 [facility] was contacted and spoke directly with Harlene Hind. Harlene provided the name of a [name] interpreter that I could call directly and do a three way call with the pt.  [employer] [phone])  was contacted and a three way call was placed to the pt.   Through [employer] the interpreter pt was notified of recent results and providers recommendation. Pt stated that she does not have transportation to the ER and requested that 911 be called.  Phone call was made to 911 to have pt transported to hospital. Name and number of [employer] was provided to the 911 operator to be of assistance in transportation to the ER.  Pt verbalized understanding with all questions answered.    [facility]     Ph # [phone] Email  [email or web address]   Routed as FYI

## 2024-09-05 NOTE — Patient Instructions (Addendum)
 Your provider has requested that you go to the basement level for lab work before leaving today. Press B on the elevator. The lab is located at the first door on the left as you exit the elevator.  You have been scheduled for an appointment with Alan Coombs PA-C on 12-02-24 at 10:40am . Please arrive 10 minutes early for your appointment.  Follow the instructions on the Hemoccult cards and mail them back to us  when you are finished or you may take them directly to the lab in the basement of the [facility] building. We will call you with the results.   Linzess script has been given to you to take back and see about where you will need to fill this medication.   Please do the following: Purchase a bottle of Miralax  over the counter as well as a box of 5 mg dulcolax tablets. Take 4 dulcolax tablets. Wait 1 hour. You will then drink 6-8 capfuls of Miralax  mixed in an adequate amount of water /juice/gatorade (you may choose which of these liquids to drink) over the next 2-3 hours. You should expect results within 1 to 6 hours after completing the bowel purge. Go to the er if you have severe AB pain, can not pass gas or stool in over 12 hours, can not hold down any food.   Can start on linzess 72 mcg 30 mins before food for constipation Take with benefiber 1 TBSP twice a day  If your blood count is still low will refer to hematology for IV iron and supportive care  Continue pantoprazole  40 mg once daily

## 2024-09-05 NOTE — ED Notes (Signed)
 Pt phone rang and pt answer in English. Person on the phone is a friend who stated that pt does understand some english. When trying to ask pt triage questions pt does not respond in english and look confused.

## 2024-09-05 NOTE — ED Provider Notes (Signed)
 " [place] EMERGENCY DEPARTMENT AT [facility] Provider Note   CSN: [id number] Arrival date & time: 09/05/24  1650     Patient presents with: abnormal labs   Krystal Maddox is a 83 y.o. female.   Pt is a 83 yo female with pmhx significant for htn, gerd, ckd, anemia, aortic stenosis s/p TAVR.  Pt has been having fatigue and sob with activity.  She did go to [facility] and had blood work.  Hgb was 6.9 (hgb 9.2 in July).  Iron level was also low.  Pt was told to come to the ED for further eval.  Pt denies any black stools.    Due to language barrier, an interpreter was present during the history-taking and subsequent discussion (and for part of the physical exam) with this patient.        Prior to Admission medications  Medication Sig Start Date End Date Taking? Authorizing Provider  amLODipine  (NORVASC ) 10 MG tablet Take 10 mg by mouth daily.  03/18/17   [provider]  amoxicillin  (AMOXIL ) 500 MG tablet Take 4 tablets by mouth 1 hour prior to dental procedures and cleanings Patient not taking: Reported on 09/05/2024 10/11/21   Sebastian Lamarr SAUNDERS, PA-C  aspirin  EC 81 MG tablet Take 81 mg by mouth daily.    [provider]  Benzocaine-Menthol  (ORAJEL 2X TOOTHACHE & GUM) 20-0.26 % GEL Use as directed 1 Application in the mouth or throat 3 (three) times daily as needed (painful gum).    [provider]  Calcium Carbonate-Vit D-Min (GNP CALCIUM 600 +D/MINERALS) 600-800 MG-UNIT TABS Take 1 tablet by mouth in the morning and at bedtime.    [provider]  Camphor-Menthol  (ARCTIC RELIEF PAIN RELIEVING) 0.2-3.5 % GEL Apply 1 Application topically 4 (four) times daily as needed (joint or muscle pain).    [provider]  chlorhexidine  (PERIDEX ) 0.12 % solution Use as directed 15 mLs in the mouth or throat every evening. Swish for 30 seconds and spit Patient not taking: Reported on 09/05/2024    [provider]  dextrose  (GLUTOSE)  40 % GEL Take 1 Tube by mouth every 15 (fifteen) minutes as needed for low blood sugar. Patient not taking: Reported on 09/05/2024    [provider]  docusate sodium  (COLACE) 100 MG capsule Take 1 capsule (100 mg total) by mouth 2 (two) times daily as needed for mild constipation. Patient not taking: Reported on 09/05/2024 03/18/24   Lue Elsie BROCKS, MD  ferrous sulfate  325 (65 FE) MG tablet Take 325 mg by mouth daily. Patient not taking: Reported on 09/05/2024    [provider]  furosemide  (LASIX ) 20 MG tablet Take 1 tablet (20 mg total) by mouth daily. Take 1 extra tablet (20mg ) at night if swelling is present 03/18/24 09/05/25  Lue Elsie BROCKS, MD  glipiZIDE (GLUCOTROL) 5 MG tablet Take 5 mg by mouth 2 (two) times daily before a meal. 10/16/21   [provider]  hydrALAZINE  (APRESOLINE ) 50 MG tablet Take 1 tablet (50 mg total) by mouth 3 (three) times daily. 05/31/24   Lavona Agent, MD  insulin  aspart (NOVOLOG ) 100 UNIT/ML injection Inject 10 Units into the skin See admin instructions. For CBG greater than 400    [provider]  Iron-Vitamin C (VITRON-C PO) Take 1 tablet by mouth daily in the afternoon.    [provider]  linaclotide LARUE) 72 MCG capsule Take 1 capsule (72 mcg total) by mouth daily before breakfast. 09/05/24  Craig Palma R, PA-C  linagliptin (TRADJENTA) 5 MG TABS tablet Take 5 mg by mouth daily.    [provider]  losartan  (COZAAR ) 25 MG tablet Take 1 tablet (25 mg total) by mouth daily. 08/19/24 11/17/24  Azobou Donley Joelle DEL, MD  Menthol -Methyl Salicylate  (MUSCLE RUB) 10-15 % CREA Apply 1 Application topically 4 (four) times daily as needed (joint/muscle pain). 03/18/24   Lue Elsie BROCKS, MD  mineral oil-hydrophilic petrolatum (AQUAPHOR) ointment Apply 1 Application topically 2 (two) times daily as needed for dry skin.    [provider]  Multiple Vitamins-Minerals (PRESERVISION AREDS 2  PO) Take 1 tablet by mouth in the morning and at bedtime.    [provider]  pantoprazole  (PROTONIX ) 40 MG tablet Take 1 tablet (40 mg total) by mouth 2 (two) times daily. 03/18/24 09/05/25  Lue Elsie BROCKS, MD  Polyethyl Glyc-Propyl Glyc PF (SYSTANE HYDRATION PF) 0.4-0.3 % SOLN Place 1 drop into both eyes in the morning, at noon, in the evening, and at bedtime.    [provider]  polyethylene glycol (MIRALAX  / GLYCOLAX ) 17 g packet Take 17 g by mouth daily as needed for moderate constipation. Patient not taking: Reported on 09/05/2024 03/18/24   Lue Elsie BROCKS, MD  pravastatin  (PRAVACHOL ) 80 MG tablet Take 80 mg by mouth every evening. Patient not taking: Reported on 09/05/2024    [provider]  psyllium (HYDROCIL/METAMUCIL) 95 % PACK Take 1 packet by mouth daily. Patient not taking: Reported on 09/05/2024    [provider]  senna-docusate (SENOKOT-S) 8.6-50 MG tablet Take 2 tablets by mouth at bedtime.    [provider]  sodium bicarbonate 650 MG tablet Take 650 mg by mouth 2 (two) times daily. 10/18/21   [provider]  White Petrolatum-Mineral Oil (SYSTANE NIGHTTIME) OINT Place 1 Application into both eyes at bedtime.    [provider]    Allergies: Hydrochlorothiazide and Tylenol  [acetaminophen ]    Review of Systems  Constitutional:  Positive for fatigue.  All other systems reviewed and are negative.   Updated Vital Signs BP (!) 163/57   Pulse 84   Temp 98.2 F (36.8 C) (Oral)   Resp 18   SpO2 99%   Physical Exam Vitals and nursing note reviewed.  Constitutional:      Appearance: Normal appearance.  HENT:     Head: Normocephalic and atraumatic.     Right Ear: External ear normal.     Left Ear: External ear normal.     Nose: Nose normal.     Mouth/Throat:     Mouth: Mucous membranes are moist.     Pharynx: Oropharynx is clear.  Eyes:     Extraocular Movements: Extraocular movements intact.      Conjunctiva/sclera: Conjunctivae normal.     Pupils: Pupils are equal, round, and reactive to light.  Cardiovascular:     Rate and Rhythm: Normal rate and regular rhythm.     Pulses: Normal pulses.     Heart sounds: Normal heart sounds.  Pulmonary:     Effort: Pulmonary effort is normal.     Breath sounds: Normal breath sounds.  Abdominal:     General: Abdomen is flat. Bowel sounds are normal.     Palpations: Abdomen is soft.  Genitourinary:    Rectum: Guaiac result negative.  Musculoskeletal:        General: Normal range of motion.     Cervical back: Normal range of motion and neck supple.  Skin:  General: Skin is warm.     Capillary Refill: Capillary refill takes less than 2 seconds.  Neurological:     General: No focal deficit present.     Mental Status: She is alert and oriented to person, place, and time.  Psychiatric:        Mood and Affect: Mood normal.        Behavior: Behavior normal.     (all labs ordered are listed, but only abnormal results are displayed) Labs Reviewed  COMPREHENSIVE METABOLIC PANEL WITH GFR - Abnormal; Notable for the following components:      Result Value   Glucose, Bld 200 (*)    BUN 37 (*)    Creatinine, Ser 1.57 (*)    Calcium 8.8 (*)    Total Protein 8.4 (*)    GFR, Estimated 32 (*)    All other components within normal limits  CBC - Abnormal; Notable for the following components:   WBC 14.4 (*)    RBC 3.19 (*)    Hemoglobin 7.2 (*)    HCT 24.0 (*)    MCV 75.2 (*)    MCH 22.6 (*)    RDW 21.6 (*)    Platelets 452 (*)    All other components within normal limits  POC OCCULT BLOOD, ED - Normal  PROTIME-INR  TYPE AND SCREEN  PREPARE RBC (CROSSMATCH)    EKG: EKG Interpretation Date/Time:  Monday September 05 2024 17:56:32 EST Ventricular Rate:  89 PR Interval:  200 QRS Duration:  86 QT Interval:  358 QTC Calculation: 435 R Axis:   47  Text Interpretation: Normal sinus rhythm Cannot rule out Inferior infarct , age  undetermined Anterior infarct , age undetermined Abnormal ECG When compared with ECG of 12-Mar-2024 13:21, PREVIOUS ECG IS PRESENT No significant change since last tracing Confirmed by Dean Clarity [id number]) on 09/05/2024 10:31:41 PM  Radiology: No results found.   Procedures   Medications Ordered in the ED  0.9 %  sodium chloride  infusion (Manually program via Guardrails IV Fluids) ( Intravenous New Bag/Given 09/05/24 2212)  pantoprazole  (PROTONIX ) injection 40 mg (40 mg Intravenous Given 09/05/24 2158)                                    Medical Decision Making Amount and/or Complexity of Data Reviewed Labs: ordered.  Risk Prescription drug management. Decision regarding hospitalization.   This patient presents to the ED for concern of fatigue, this involves an extensive number of treatment options, and is a complaint that carries with it a high risk of complications and morbidity.  The differential diagnosis includes anemia, electrolyte abn, infection   Co morbidities that complicate the patient evaluation  htn, gerd, ckd, anemia, aortic stenosis s/p TAVR   Additional history obtained:  Additional history obtained from epic chart review    Lab Tests:  I Ordered, and personally interpreted labs.  The pertinent results include:  wbc elevated at 14.4, hgb 7.2, plt 452; cmp with nl na, glucose elevated at 200, bun 37 and cr 1.57 (cr 1.03 in July)   Cardiac Monitoring:  The patient was maintained on a cardiac monitor.  I personally viewed and interpreted the cardiac monitored which showed an underlying rhythm of: nsr   Medicines ordered and prescription drug management:  I ordered medication including transfusion/protonix   for sx  Reevaluation of the patient after these medicines showed that the patient improved I have  reviewed the patients home medicines and have made adjustments as needed   Consultations Obtained:  I requested consultation with the  gastroenterologist (Dr. Wilhelmenia),  and discussed lab and imaging findings as well as pertinent plan -he recommended admitting overnight.  LB outpatient capsule system is not functional, so she will likely need that and can't get it as an outpatient.  NPO after midnight. Pt d/w Dr. Bryn ([facility]) who will admit   Problem List / ED Course:  Symptomatic anemia:  plan for transfusion and for GI to see in am.  Likely capsule study tomorrow as she's had recent colonoscopy and endoscopy.   Reevaluation:  After the interventions noted above, I reevaluated the patient and found that they have :improved   Social Determinants of Health:  Lives at home; speaks Jarai   Dispostion:  After consideration of the diagnostic results and the patients response to treatment, I feel that the patent would benefit from admission.  CRITICAL CARE Performed by: Mliss Boyers   Total critical care time: 30 minutes  Critical care time was exclusive of separately billable procedures and treating other patients.  Critical care was necessary to treat or prevent imminent or life-threatening deterioration.  Critical care was time spent personally by me on the following activities: development of treatment plan with patient and/or surrogate as well as nursing, discussions with consultants, evaluation of patient's response to treatment, examination of patient, obtaining history from patient or surrogate, ordering and performing treatments and interventions, ordering and review of laboratory studies, ordering and review of radiographic studies, pulse oximetry and re-evaluation of patient's condition.        Final diagnoses:  Symptomatic anemia    ED Discharge Orders     None          Boyers Mliss, MD 09/05/24 2234  "

## 2024-09-05 NOTE — Telephone Encounter (Signed)
 Darice from lab said that hemoglobin was 6.9 and hematocrit was 22.4. Please advise

## 2024-09-05 NOTE — ED Provider Triage Note (Signed)
 Emergency Medicine Provider Triage Evaluation Note  Krystal Maddox , a 83 y.o. female  was evaluated in triage.  Pt complains of low hemoglobin.  Review of Systems  Positive: Fatigue Negative: Shortness of breath, blood in stool  Physical Exam  BP (!) 162/47 (BP Location: Left Arm)   Pulse 93   Temp 98.2 F (36.8 C)   Resp 19   SpO2 100%  Gen:   Awake, no distress    Resp:  Normal effort   MSK:   Moves extremities without difficulty   Other:     Medical Decision Making  Medically screening exam initiated at 6:41 PM.  Appropriate orders placed.  Latise Ligas was informed that the remainder of the evaluation will be completed by another provider, this initial triage assessment does not replace that evaluation, and the importance of remaining in the ED until their evaluation is complete.      Lenor Hollering, MD 09/05/24 [phone]

## 2024-09-05 NOTE — Telephone Encounter (Signed)
 Amy, FYI, see phone message below. Patient is currently at [facility] ED.

## 2024-09-05 NOTE — Telephone Encounter (Signed)
 Interpreter serves stated that there is no [place] interpreter available.  Pt does not speak english. Left voice message for pt daughter/grandaughter to call back.  Unable to reach pt son. Voice mailbox is full.  Page sent to Interpreter to call back. Pager number [phone]

## 2024-09-05 NOTE — ED Notes (Signed)
 Krystal Maddox [phone] asked to not be called again to interpret until tomorrow morning so he can sleep as he is a family friend.

## 2024-09-05 NOTE — ED Notes (Signed)
Attending provider at bedside

## 2024-09-05 NOTE — ED Notes (Signed)
 Keo- interpreter can be reached at [phone]

## 2024-09-05 NOTE — ED Notes (Signed)
 Interpreter contacted and will be coming onsite to interpret as soon as possible

## 2024-09-05 NOTE — ED Triage Notes (Signed)
 BIB EMS from home Hgb 6.9. No known complaints. EMS was unable to fully assess due to language barrier.    EMS VS 116 palpated.  97% ra 97 HR

## 2024-09-06 ENCOUNTER — Encounter (HOSPITAL_COMMUNITY)
Admission: EM | Disposition: A | Discharge status: Home / Self Care | Payer: Self-pay | Source: Home / Self Care | Attending: Internal Medicine

## 2024-09-06 ENCOUNTER — Encounter (HOSPITAL_COMMUNITY): Payer: Self-pay | Admitting: Family Medicine

## 2024-09-06 DIAGNOSIS — Z8719 Personal history of other diseases of the digestive system: Secondary | ICD-10-CM | POA: Diagnosis not present

## 2024-09-06 DIAGNOSIS — D500 Iron deficiency anemia secondary to blood loss (chronic): Secondary | ICD-10-CM | POA: Diagnosis not present

## 2024-09-06 DIAGNOSIS — D649 Anemia, unspecified: Secondary | ICD-10-CM

## 2024-09-06 DIAGNOSIS — K219 Gastro-esophageal reflux disease without esophagitis: Secondary | ICD-10-CM | POA: Diagnosis not present

## 2024-09-06 DIAGNOSIS — K922 Gastrointestinal hemorrhage, unspecified: Secondary | ICD-10-CM | POA: Diagnosis not present

## 2024-09-06 DIAGNOSIS — Z860101 Personal history of adenomatous and serrated colon polyps: Secondary | ICD-10-CM | POA: Diagnosis not present

## 2024-09-06 DIAGNOSIS — K5909 Other constipation: Secondary | ICD-10-CM | POA: Diagnosis not present

## 2024-09-06 DIAGNOSIS — D509 Iron deficiency anemia, unspecified: Secondary | ICD-10-CM

## 2024-09-06 DIAGNOSIS — K31819 Angiodysplasia of stomach and duodenum without bleeding: Secondary | ICD-10-CM | POA: Diagnosis not present

## 2024-09-06 HISTORY — PX: GIVENS CAPSULE STUDY: SHX5432

## 2024-09-06 LAB — CBC
HCT: 25.8 % — ABNORMAL LOW (ref 36.0–46.0)
Hemoglobin: 8.1 g/dL — ABNORMAL LOW (ref 12.0–15.0)
MCH: 23.8 pg — ABNORMAL LOW (ref 26.0–34.0)
MCHC: 31.4 g/dL (ref 30.0–36.0)
MCV: 75.9 fL — ABNORMAL LOW (ref 80.0–100.0)
Platelets: 355 K/uL (ref 150–400)
RBC: 3.4 MIL/uL — ABNORMAL LOW (ref 3.87–5.11)
RDW: 18.9 % — ABNORMAL HIGH (ref 11.5–15.5)
WBC: 12.5 K/uL — ABNORMAL HIGH (ref 4.0–10.5)
nRBC: 0 % (ref 0.0–0.2)

## 2024-09-06 LAB — CBG MONITORING, ED
Glucose-Capillary: 115 mg/dL — ABNORMAL HIGH (ref 70–99)
Glucose-Capillary: 112 mg/dL — ABNORMAL HIGH (ref 70–99)
Glucose-Capillary: 129 mg/dL — ABNORMAL HIGH (ref 70–99)
Glucose-Capillary: 126 mg/dL — ABNORMAL HIGH (ref 70–99)

## 2024-09-06 LAB — BASIC METABOLIC PANEL WITH GFR
Anion gap: 9 (ref 5–15)
BUN: 30 mg/dL — ABNORMAL HIGH (ref 8–23)
CO2: 25 mmol/L (ref 22–32)
Calcium: 8.3 mg/dL — ABNORMAL LOW (ref 8.9–10.3)
Chloride: 105 mmol/L (ref 98–111)
Creatinine, Ser: 1.24 mg/dL — ABNORMAL HIGH (ref 0.44–1.00)
GFR, Estimated: 43 mL/min — ABNORMAL LOW
Glucose, Bld: 121 mg/dL — ABNORMAL HIGH (ref 70–99)
Potassium: 3.3 mmol/L — ABNORMAL LOW (ref 3.5–5.1)
Sodium: 139 mmol/L (ref 135–145)

## 2024-09-06 LAB — PROTIME-INR
INR: 1 (ref 0.8–1.2)
Prothrombin Time: 13.3 s (ref 11.4–15.2)

## 2024-09-06 LAB — GLUCOSE, CAPILLARY
Glucose-Capillary: 109 mg/dL — ABNORMAL HIGH (ref 70–99)
Glucose-Capillary: 134 mg/dL — ABNORMAL HIGH (ref 70–99)

## 2024-09-06 SURGERY — IMAGING PROCEDURE, GI TRACT, INTRALUMINAL, VIA CAPSULE

## 2024-09-06 MED ORDER — PANTOPRAZOLE SODIUM 40 MG IV SOLR
40.0000 mg | INTRAVENOUS | Status: DC
  Administered 2024-09-06 – 2024-09-07 (×2): 40 mg via INTRAVENOUS
  Filled 2024-09-06 (×2): qty 10

## 2024-09-06 MED ORDER — INSULIN ASPART 100 UNIT/ML IJ SOLN
0.0000 [IU] | INTRAMUSCULAR | Status: DC
  Filled 2024-09-06: qty 3
  Filled 2024-09-06: qty 1
  Filled 2024-09-06: qty 2

## 2024-09-06 MED ORDER — IRON SUCROSE 200 MG IVPB - SIMPLE MED
200.0000 mg | Freq: Once | Status: AC
  Administered 2024-09-07: 200 mg via INTRAVENOUS
  Filled 2024-09-06: qty 10

## 2024-09-06 MED ORDER — POTASSIUM CHLORIDE IN NACL 20-0.45 MEQ/L-% IV SOLN
INTRAVENOUS | Status: DC
  Filled 2024-09-06: qty 1000

## 2024-09-06 MED ORDER — HYDRALAZINE HCL 50 MG PO TABS
50.0000 mg | ORAL_TABLET | Freq: Three times a day (TID) | ORAL | Status: DC
  Administered 2024-09-06 – 2024-09-08 (×5): 50 mg via ORAL
  Filled 2024-09-06 (×6): qty 1

## 2024-09-06 MED ORDER — LACTATED RINGERS IV SOLN: INTRAVENOUS | Status: AC

## 2024-09-06 SURGICAL SUPPLY — 1 items: TOWEL COTTON PACK 4EA (MISCELLANEOUS) ×2 IMPLANT

## 2024-09-06 NOTE — Plan of Care (Signed)

## 2024-09-06 NOTE — Progress Notes (Signed)
 Krystal Maddox capsule endoscopy ordered by MD Danis.  Patient ingested capsule at 1315.  Per Given's capsule instructions, patient to remain NPO until 1515 at which time they may progress to clear liquid diet. At 1715 patient may have a small snack such as a half a sandwich or a bowl of soup. At 2115 patient may progress to previously ordered diet.  The capsule endoscopy study will conclude at 0115 (09/07/24) at which time the recorder and leads or belt can be removed and placed in a patient belongings bag. Endoscopy staff will pick up the equipment in the AM.  Instructions provided to patient and inpatient RN. Patient and RN demonstrated understanding.

## 2024-09-06 NOTE — Progress Notes (Signed)
 " Progress Note    Krystal Maddox   FMW:[id number]  DOB: 1940-10-24  DOA: 09/05/2024     0 PCP: Cloria Annabella CROME, DO  Initial CC: anemia  Hospital Course: Krystal Maddox is an 83 y.o. 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   She reports progressive fatigue and exertional shortness of breath, prompting outpatient evaluation by [facility] prior to admission. Laboratory testing revealed a hemoglobin of 6.9 g/dL (down from 9.2 g/dL in July 7974), so she was instructed to present to the ED for further evaluation and blood transfusion. She denies melena, hematochezia, or overt bleeding. In the ED, VSS, negative FOBT. Labs confirmed microcytic anemia (Hgb 7.2 g/dL, MCV 75) with thrombocytosis, leukocytosis, and worsened renal function (Cr 1.57, baseline ~1.0).    GI was consulted formally by EDP, Dr. Wilhelmenia recommended admission for diagnostic workup on 12/30. 1u RBCs ordered by EDP.  VCE commenced on 12/30.   Assessment and Plan  Symptomatic acute/progressive iron deficiency anemia - s/p 1 unit PRBC - s/p dose of IV iron, would also likely benefit from IV iron referral at discharge with enteral intolerance and severity of deficiency.  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 Will continue PPI by IV. Avoiding NSAIDs, EtOH.    HTN - BP up, will continue hydralazine  50mg  TID per outpatient cardiology notes pending formal med rec. This is the only medication reported in dispense report. Note DC summary from July 2025 said stopped ARB/HCTZ, continue new norvasc , prn lasix .    s/p TAVR - Typically would be on DAPT x3-6 months then indefinite ASA 81mg . Given  concern for GI bleeding, was placed on hold. Would ideally be able to restart if no high-risk/bleeding lesions identified. May need discussion amongst GI and cardiology (sees Dr. Azobou Tonleu [facility]) going forward. Other meds pending formal med rec.    AKI on stage IIIb CKD - patient has history of CKD3b. Baseline creat ~ 1 - 1.2, eGFR~ 43 - patient presents with increase in creat >0.3 mg/dL above baseline or creat increase >1.5x baseline presumed to have occurred within past 7 days PTA - creat 1.54 on admission; improved with fluids    PAD: No critical ischemia noted.  - Restart statin once confirmed   Chronic hyponatremia: Na currently wnl. Suspicion was that this was due to HCTZ which is on allergies list now.   Well-controlled T2DM:  - Will give very sensitive SSI for now, hold any home medications she may be on   Thrombocytosis: Likely reactive to iron deficiency anemia   Leukocytosis: Without fever or source of infection noted at this time. Will monitor off antimicrobial treatment at this time.   Interval History:  No events overnight.  Seen this afternoon after capsule study commenced.  No abdominal pain.  Resting comfortably in bed with daughter present bedside.  Antimicrobials:   DVT prophylaxis:  SCDs Start: 09/06/24 0123   Code Status:   Code Status: Full Code  Mobility Assessment (Last 72 Hours)     Mobility Assessment     Row Name 09/06/24 1500           Does the patient have exclusion  criteria? No- Perform mobility assessment       What is the highest level of mobility based on the mobility assessment? Level 4 (Ambulates with assistance) - Balance while stepping forward/back - Complete          Diet: Diet Orders (From admission, onward)     Start     Ordered   09/06/24 1750  Diet Carb Modified Room service appropriate? Yes  Diet effective now       Question Answer Comment  Diet-HS Snack? Nothing   Calorie Level Medium 1600-2000   Fluid consistency:  Thin   Room service appropriate? Yes      09/06/24 1749            Barriers to discharge: None Disposition Plan: Home HH orders placed: N/A Status is: Observation  Objective: Blood pressure (!) 184/64, pulse 81, temperature 98.6 F (37 C), resp. rate 20, SpO2 96%.  Examination:  Physical Exam Constitutional:      Appearance: Normal appearance.  HENT:     Head: Normocephalic and atraumatic.     Mouth/Throat:     Mouth: Mucous membranes are moist.  Eyes:     Extraocular Movements: Extraocular movements intact.  Cardiovascular:     Rate and Rhythm: Normal rate and regular rhythm.  Pulmonary:     Effort: Pulmonary effort is normal. No respiratory distress.     Breath sounds: Normal breath sounds. No wheezing.  Abdominal:     General: Bowel sounds are normal. There is no distension.     Palpations: Abdomen is soft.     Tenderness: There is no abdominal tenderness.  Musculoskeletal:        General: Normal range of motion.     Cervical back: Normal range of motion and neck supple.  Skin:    General: Skin is warm and dry.  Neurological:     General: No focal deficit present.     Mental Status: She is alert.  Psychiatric:        Mood and Affect: Mood normal.      Consultants:  GI  Procedures:  09/06/2024: Video capsule endoscopy  Data Reviewed: Results for orders placed or performed during the hospital encounter of 09/05/24 (from the past 24 hours)  Comprehensive metabolic panel     Status: Abnormal   Collection Time: 09/05/24  5:59 PM  Result Value Ref Range   Sodium 137 135 - 145 mmol/L   Potassium 3.6 3.5 - 5.1 mmol/L   Chloride 99 98 - 111 mmol/L   CO2 26 22 - 32 mmol/L   Glucose, Bld 200 (H) 70 - 99 mg/dL   BUN 37 (H) 8 - 23 mg/dL   Creatinine, Ser 8.42 (H) 0.44 - 1.00 mg/dL   Calcium 8.8 (L) 8.9 - 10.3 mg/dL   Total Protein 8.4 (H) 6.5 - 8.1 g/dL   Albumin 4.0 3.5 - 5.0 g/dL   AST 28 15 - 41 U/L   ALT 12 0 - 44 U/L   Alkaline Phosphatase 75 38 -  126 U/L   Total Bilirubin 0.3 0.0 - 1.2 mg/dL   GFR, Estimated 32 (L) >60 mL/min   Anion gap 12 5 - 15  CBC     Status: Abnormal   Collection Time: 09/05/24  5:59 PM  Result Value Ref Range   WBC 14.4 (H) 4.0 - 10.5 K/uL   RBC 3.19 (L) 3.87 - 5.11 MIL/uL   Hemoglobin 7.2 (L) 12.0 - 15.0 g/dL   HCT 75.9 (L)  36.0 - 46.0 %   MCV 75.2 (L) 80.0 - 100.0 fL   MCH 22.6 (L) 26.0 - 34.0 pg   MCHC 30.0 30.0 - 36.0 g/dL   RDW 78.3 (H) 88.4 - 84.4 %   Platelets 452 (H) 150 - 400 K/uL   nRBC 0.0 0.0 - 0.2 %  Prepare RBC (crossmatch)     Status: None   Collection Time: 09/05/24  8:57 PM  Result Value Ref Range   Order Confirmation      ORDER PROCESSED BY BLOOD BANK Performed at [facility], [address], [place], [place] [address]   POC occult blood, ED     Status: Normal   Collection Time: 09/05/24  9:10 PM  Result Value Ref Range   Fecal Occult Blood, POC Negative Negative  CBG monitoring, ED     Status: Abnormal   Collection Time: 09/06/24  1:41 AM  Result Value Ref Range   Glucose-Capillary 115 (H) 70 - 99 mg/dL  CBG monitoring, ED     Status: Abnormal   Collection Time: 09/06/24  3:52 AM  Result Value Ref Range   Glucose-Capillary 112 (H) 70 - 99 mg/dL  CBC     Status: Abnormal   Collection Time: 09/06/24  4:06 AM  Result Value Ref Range   WBC 12.5 (H) 4.0 - 10.5 K/uL   RBC 3.40 (L) 3.87 - 5.11 MIL/uL   Hemoglobin 8.1 (L) 12.0 - 15.0 g/dL   HCT 74.1 (L) 63.9 - 53.9 %   MCV 75.9 (L) 80.0 - 100.0 fL   MCH 23.8 (L) 26.0 - 34.0 pg   MCHC 31.4 30.0 - 36.0 g/dL   RDW 81.0 (H) 88.4 - 84.4 %   Platelets 355 150 - 400 K/uL   nRBC 0.0 0.0 - 0.2 %  Basic metabolic panel with GFR     Status: Abnormal   Collection Time: 09/06/24  4:06 AM  Result Value Ref Range   Sodium 139 135 - 145 mmol/L   Potassium 3.3 (L) 3.5 - 5.1 mmol/L   Chloride 105 98 - 111 mmol/L   CO2 25 22 - 32 mmol/L   Glucose, Bld 121 (H) 70 - 99 mg/dL   BUN 30 (H) 8 - 23 mg/dL   Creatinine, Ser 8.75 (H)  0.44 - 1.00 mg/dL   Calcium 8.3 (L) 8.9 - 10.3 mg/dL   GFR, Estimated 43 (L) >60 mL/min   Anion gap 9 5 - 15  CBG monitoring, ED     Status: Abnormal   Collection Time: 09/06/24  7:53 AM  Result Value Ref Range   Glucose-Capillary 129 (H) 70 - 99 mg/dL  CBG monitoring, ED     Status: Abnormal   Collection Time: 09/06/24 11:46 AM  Result Value Ref Range   Glucose-Capillary 126 (H) 70 - 99 mg/dL    I have reviewed pertinent nursing notes, vitals, labs, and images as necessary. I have ordered labwork to follow up on as indicated.  I have reviewed the last notes from staff over past 24 hours. I have discussed patient's care plan and test results with nursing staff, CM/SW, and other staff as appropriate.  Old records reviewed in assessment of this patient  Time spent: Greater than 50% of the 55 minute visit was spent in counseling/coordination of care for the patient as laid out in the A&P.   LOS: 0 days   Alm Apo, MD [facility] 09/06/2024, 5:52 PM "

## 2024-09-06 NOTE — Progress Notes (Signed)
 Patient ID: Krystal Maddox, female   DOB: 23-Sep-1940, 83 y.o.   MRN: [id number]    Progress Note   Subjective   Day # 1 CC; iron deficiency anemia with drop in hemoglobin, Hemoccult negative  Please see full GI note done yesterday 09/05/2024/Amanda Craig RIGGERS with office visit  Patient had labs done yesterday for follow-up of iron deficiency anemia and was found to have hemoglobin 6.9/hematocrit 22.4/MCV 71.4/platelets 411.  This was down from a hemoglobin of 9.2 when last checked in July 2025  Was referred to the emergency room.  Repeat hemoglobin here 7.2, she did receive 1 unit of of packed RBCs and hemoglobin is 8.1 currently, and has been documented Hemoccult negative  Patient has been n.p.o. overnight, lying in the hallway in the emergency room only complaint is a feeling of constipation.  Interview was done via phone interpreter [place]   Objective   Vital signs in last 24 hours: Temp:  [98 F (36.7 C)-98.7 F (37.1 C)] 98 F (36.7 C) (12/30 0920) Pulse Rate:  [73-93] 91 (12/30 0920) Resp:  [16-19] 16 (12/30 0920) BP: (123-163)/(47-78) 143/48 (12/30 0920) SpO2:  [94 %-100 %] 95 % (12/30 0920) Weight:  [64.9 kg] 64.9 kg (12/29 1037)   General:    Elderly Montagnard female in NAD, pleasant, Heart:  Regular rate and rhythm; no murmurs Lungs: Respirations even and unlabored, lungs CTA bilaterally Abdomen:  Soft, nontender and nondistended. Normal bowel sounds. Extremities:  Without edema. Neurologic:  Alert and oriented,  grossly normal neurologically. Psych:  Cooperative. Normal mood and affect.  Intake/Output from previous day: 12/29 0701 - 12/30 0700 In: 336.8 [I.V.:21.8; Blood:315] Out: 600 [Urine:600] Intake/Output this shift: No intake/output data recorded.  Lab Results: Recent Labs    09/05/24 1148 09/05/24 1759 09/06/24 0406  WBC 11.5* 14.4* 12.5*  HGB 6.9 Repeated and verified X2.* 7.2* 8.1*  HCT 22.4 Repeated and verified X2.* 24.0* 25.8*  PLT  411.0* 452* 355   BMET Recent Labs    09/05/24 1148 09/05/24 1759 09/06/24 0406  NA 140 137 139  K 3.7 3.6 3.3*  CL 101 99 105  CO2 28 26 25   GLUCOSE 155* 200* 121*  BUN 33* 37* 30*  CREATININE 1.54* 1.57* 1.24*  CALCIUM 8.7 8.8* 8.3*   LFT Recent Labs    09/05/24 1759  PROT 8.4*  ALBUMIN 4.0  AST 28  ALT 12  ALKPHOS 75  BILITOT 0.3   PT/INR Recent Labs    09/05/24 0144  LABPROT 13.3  INR 1.0         Assessment / Plan:    #54 83 year old non-English-speaking female, with history of iron deficiency anemia, previously documented colonic AVMs, and gastritis noted on most recent EGD who was seen in our office yesterday for follow-up. Labs yesterday showed a drop in hemoglobin to 6.9, from hemoglobin of 9.2 when last checked in July.  Patient asymptomatic and unaware of any melena or hematochezia.  Occult negative in the ER Received 1 unit of packed RBCs and hemoglobin up to 8.1 today  Iron studies still consistent with iron deficiency anemia  Recent EGD July 2025 showed gastritis and 1 focal erosion on the cardia side of EGJ with friability otherwise unremarkable-biopsies negative for H. pylori Colonoscopy July 2025 difficult secondary to redundant colon, fair prep, normal TI there was a single AVM treated with APC and clip and 2 diminutive colon polyps removed and 3 tubular adenomatous polyps also removed.  Iron deficiency anemia may be secondary to  AVMs, patient has not had capsule endoscopy to evaluate for additional small bowel AVMs.  #2 chronic constipation-Linzess was started yesterday 72 mcg, and Benefiber  #3 GERD #4 peripheral arterial disease #5.  Chronic kidney disease #6 moderate to severe mitral regurgitation #7.  Mild pulmonary hypertension  Plan; n.p.o. this a.m. Proceed with capsule endoscopy today, I discussed capsule endoscopy in detail with the patient via phone interpreter including indications risks and benefits and she is agreeable to  proceed. Advance diet later today Anticipate she will be able to be discharged tomorrow-Will need referral to hematology for the chronic iron deficiency and initiation of iron infusions Has been on ferrous sulfate  325 daily at home.      Principal Problem:   Symptomatic anemia Active Problems:   Peripheral arterial disease   S/P TAVR (transcatheter aortic valve replacement)   Hypertension   Iron deficiency anemia due to chronic blood loss     LOS: 0 days   Finnbar Cedillos PA-C 09/06/2024, 9:53 AM

## 2024-09-06 NOTE — H&P (Signed)
 " History and Physical    PatientRikayla Demmon FMW:[id number] DOB: [birth year] DOA: 09/05/2024 DOS: the patient was seen and examined on 09/06/2024 PCP: Cloria Annabella CROME, DO  Patient coming from: Home  Chief Complaint:  Chief Complaint  Patient presents with   abnormal labs   HPI: Keyonta Madrid is an 83 y.o. female [name] speaker with a history of IDA, GERD, AS s/p TAVR Dec 2022, PAD, HTN, stage IIIb CKD who presented to the ED on the advice of GI clinic due to anemia.  She reports progressive fatigue and exertional shortness of breath, prompting outpatient evaluation by [facility] earlier today. Laboratory testing revealed a hemoglobin of 6.9 g/dL (down from 9.2 g/dL in July 7974), so she was instructed to present to the ED for further evaluation and blood transfusion. She denies melena, hematochezia, or overt bleeding. In the ED, VSS, negative FOBT. Labs confirmed microcytic anemia (Hgb 7.2 g/dL, MCV 75) with thrombocytosis, leukocytosis, and worsened renal function (Cr 1.57, baseline ~1.0).   GI was consulted formally by EDP, Dr. Wilhelmenia recommended admission for diagnostic workup on 12/30. 1u RBCs ordered by EDP.   Phone interpretor utilized throughout encounter.   Review of Systems: As mentioned in the history of present illness. All other systems reviewed and are negative. 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 Verlin and Dr Lucas.   Severe aortic stenosis    Superficial vein thrombosis 05/21/2021   R arm   Vitamin D deficiency    Past Surgical History:  Procedure Laterality Date   COLONOSCOPY N/A 03/17/2024   Procedure:  COLONOSCOPY;  Surgeon: Legrand Victory CROME DOUGLAS, MD;  Location: [facility] ENDOSCOPY;  Service: Gastroenterology;  Laterality: N/A;   ESOPHAGOGASTRODUODENOSCOPY N/A 03/17/2024   Procedure: EGD (ESOPHAGOGASTRODUODENOSCOPY);  Surgeon: Legrand Victory CROME DOUGLAS, MD;  Location: [facility] ENDOSCOPY;  Service: Gastroenterology;  Laterality: N/A;   EYE SURGERY Left 2022   cataract   INTRAOPERATIVE TRANSTHORACIC ECHOCARDIOGRAM N/A 08/13/2021   Procedure: INTRAOPERATIVE TRANSTHORACIC ECHOCARDIOGRAM;  Surgeon: Verlin Lonni BIRCH, MD;  Location: [facility] INVASIVE CV LAB;  Service: Open Heart Surgery;  Laterality: N/A;   KNEE SURGERY Left    RIGHT/LEFT HEART CATH AND CORONARY ANGIOGRAPHY N/A 05/17/2021   Procedure: RIGHT/LEFT HEART CATH AND CORONARY ANGIOGRAPHY;  Surgeon: Verlin Lonni BIRCH, MD;  Location: [facility] INVASIVE CV LAB;  Service: Cardiovascular;  Laterality: N/A;   TRANSCATHETER AORTIC VALVE REPLACEMENT, TRANSFEMORAL N/A 08/13/2021   Procedure: TRANSCATHETER AORTIC VALVE REPLACEMENT, TRANSFEMORAL;  Surgeon: Verlin Lonni BIRCH, MD;  Location: [facility] INVASIVE CV LAB;  Service: Open Heart Surgery;  Laterality: N/A;   Social History:  reports that she has an unknown smoking status. She has never used smokeless tobacco. She reports that she does not drink alcohol and does not use drugs.  Allergies[1]  Family History  Problem Relation Age of Onset   Hypertension Mother     Prior to Admission medications  Medication Sig Start Date End Date Taking? Authorizing Provider  amLODipine  (NORVASC ) 10 MG tablet Take 10 mg by mouth daily.  03/18/17   [provider]  amoxicillin  (AMOXIL ) 500 MG tablet Take 4 tablets by mouth  1 hour prior to dental procedures and cleanings Patient not taking: Reported on 09/05/2024 10/11/21   Sebastian Lamarr SAUNDERS, PA-C  aspirin  EC 81 MG tablet Take 81 mg by mouth daily.    [provider]  Benzocaine-Menthol  (ORAJEL 2X TOOTHACHE & GUM) 20-0.26 % GEL Use as directed 1 Application in the  mouth or throat 3 (three) times daily as needed (painful gum).    [provider]  Calcium Carbonate-Vit D-Min (GNP CALCIUM 600 +D/MINERALS) 600-800 MG-UNIT TABS Take 1 tablet by mouth in the morning and at bedtime.    [provider]  Camphor-Menthol  (ARCTIC RELIEF PAIN RELIEVING) 0.2-3.5 % GEL Apply 1 Application topically 4 (four) times daily as needed (joint or muscle pain).    [provider]  chlorhexidine  (PERIDEX ) 0.12 % solution Use as directed 15 mLs in the mouth or throat every evening. Swish for 30 seconds and spit Patient not taking: Reported on 09/05/2024    [provider]  dextrose  (GLUTOSE) 40 % GEL Take 1 Tube by mouth every 15 (fifteen) minutes as needed for low blood sugar. Patient not taking: Reported on 09/05/2024    [provider]  docusate sodium  (COLACE) 100 MG capsule Take 1 capsule (100 mg total) by mouth 2 (two) times daily as needed for mild constipation. Patient not taking: Reported on 09/05/2024 03/18/24   Lue Elsie BROCKS, MD  ferrous sulfate  325 (65 FE) MG tablet Take 325 mg by mouth daily. Patient not taking: Reported on 09/05/2024    [provider]  furosemide  (LASIX ) 20 MG tablet Take 1 tablet (20 mg total) by mouth daily. Take 1 extra tablet (20mg ) at night if swelling is present 03/18/24 09/05/25  Lue Elsie BROCKS, MD  glipiZIDE (GLUCOTROL) 5 MG tablet Take 5 mg by mouth 2 (two) times daily before a meal. 10/16/21   [provider]  hydrALAZINE  (APRESOLINE ) 50 MG tablet Take 1 tablet (50 mg total) by mouth 3 (three) times daily. 05/31/24   Lavona Agent, MD  insulin  aspart (NOVOLOG ) 100 UNIT/ML injection Inject 10 Units into the skin See admin instructions. For CBG greater than 400    [provider]  Iron-Vitamin C (VITRON-C PO) Take 1 tablet by mouth daily in the afternoon.    [provider]  linaclotide LARUE) 72 MCG capsule Take 1 capsule (72 mcg total) by mouth daily  before breakfast. 09/05/24   Craig Alan SAUNDERS, PA-C  linagliptin (TRADJENTA) 5 MG TABS tablet Take 5 mg by mouth daily.    [provider]  losartan  (COZAAR ) 25 MG tablet Take 1 tablet (25 mg total) by mouth daily. 08/19/24 11/17/24  Azobou Donley Joelle DEL, MD  Menthol -Methyl Salicylate  (MUSCLE RUB) 10-15 % CREA Apply 1 Application topically 4 (four) times daily as needed (joint/muscle pain). 03/18/24   Lue Elsie BROCKS, MD  mineral oil-hydrophilic petrolatum (AQUAPHOR) ointment Apply 1 Application topically 2 (two) times daily as needed for dry skin.    [provider]  Multiple Vitamins-Minerals (PRESERVISION AREDS 2 PO) Take 1 tablet by mouth in the morning and at bedtime.    [provider]  pantoprazole  (PROTONIX ) 40 MG tablet Take 1 tablet (40 mg total) by mouth 2 (two) times daily. 03/18/24 09/05/25  Lue Elsie BROCKS, MD  Polyethyl Glyc-Propyl Glyc PF (SYSTANE HYDRATION PF) 0.4-0.3 % SOLN Place 1 drop into both eyes in the morning, at noon, in the evening, and at bedtime.    [provider]  polyethylene glycol (MIRALAX  / GLYCOLAX ) 17  g packet Take 17 g by mouth daily as needed for moderate constipation. Patient not taking: Reported on 09/05/2024 03/18/24   Lue Elsie BROCKS, MD  pravastatin  (PRAVACHOL ) 80 MG tablet Take 80 mg by mouth every evening. Patient not taking: Reported on 09/05/2024    [provider]  psyllium (HYDROCIL/METAMUCIL) 95 % PACK Take 1 packet by mouth daily. Patient not taking: Reported on 09/05/2024    [provider]  senna-docusate (SENOKOT-S) 8.6-50 MG tablet Take 2 tablets by mouth at bedtime.    [provider]  sodium bicarbonate 650 MG tablet Take 650 mg by mouth 2 (two) times daily. 10/18/21   [provider]  White Petrolatum-Mineral Oil (SYSTANE NIGHTTIME) OINT Place 1 Application into both eyes at bedtime.    [provider]    Physical Exam: Vitals:   09/05/24 2225  09/05/24 2300 09/05/24 2330 09/06/24 0010  BP: (!) 163/57 (!) 153/56 (!) 144/57 (!) 151/59  Pulse: 84 78 73 74  Resp: 18   17  Temp: 98.2 F (36.8 C)   98.2 F (36.8 C)  TempSrc: Oral   Oral  SpO2: 99% 96% 96% 98%  Gen: No distress, pale Pulm: Clear, nonlabored  CV: RRR, no MRG, 1+ LE woody edema.  GI: Soft, NT, ND, +BS Neuro: Alert and oriented. No new focal deficits. Ext: Warm, no deformities, dry Skin: No bleeding sources, rashes, lesions or ulcers on visualized skin   Data Reviewed: Hgb 7.2 g/dL, microcytic w/MCV 75, MCH 22.6, RDW 21.6 Plt 452k, WBC 14.4k Ferritin 11, iron 17, TIBC 417, 4% sat FOBT negative in ED SCr 1.57 (baseline ~1.1) BUN 37, LFTs wnl.  Glucose 200 mg/dL ECG: NSR, no ischemic changes acutely  Assessment and Plan: Symptomatic acute/progressive iron deficiency anemia:  - Will give 1u RBCs. Recheck CBC in AM - Will give IV iron now, would also likely benefit from IV iron referral at discharge with enteral intolerance and severity of deficiency.  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 Will continue PPI by IV. Avoiding NSAIDs, EtOH.   HTN: BP up, will continue hydralazine  50mg  TID per outpatient cardiology notes pending formal med rec. This is the only medication reported in dispense report. Note DC summary from July 2025 said stopped ARB/HCTZ, continue new norvasc , prn lasix .   s/p TAVR: Typically would be on DAPT x3-6 months then indefinite ASA 81mg . Given concern for GI bleeding, will not give tonight. Would ideally be able to restart if no high-risk/bleeding lesions identified. May need discussion amongst GI and cardiology (sees Dr. Azobou Tonleu [facility]) going forward. Other meds pending formal med rec.   AKI on stage IIIb CKD: Given NPO p MN, will  give gentle IVF (also has edema albeit improved from normal per pt)  - Monitor metabolic profile in AM. - Monitor UOP w/transfusion   PAD: No critical ischemia noted.  - Restart statin once confirmed  Chronic hyponatremia: Na currently wnl. Suspicion was that this was due to HCTZ which is on allergies list now.  Well-controlled T2DM:  - Will give very sensitive SSI for now, hold any home medications she may be on  Thrombocytosis: Likely reactive to iron deficiency anemia  Leukocytosis: Without fever or source of infection noted at this time. Will monitor off antimicrobial treatment at this time.  Advance Care Planning: Full code confirmed at admission  Consults: GI, Dr. Wilhelmenia  Family Communication: None at bedside, pt declined offer to call  Severity of Illness: The appropriate patient status for this patient is OBSERVATION. Observation status is judged to be reasonable and necessary in order to provide the required intensity of service to ensure the patient's safety. The patient's presenting symptoms, physical exam findings, and initial radiographic and laboratory data in the context of their medical condition is felt to place them at decreased risk for further clinical deterioration. Furthermore, it is anticipated that the patient will be medically stable for discharge from the hospital within 2 midnights of admission.   Author: Bernardino KATHEE Come, MD 09/06/2024 12:28 AM  For on call review [email or web address].     [1]  Allergies Allergen Reactions   Hydrochlorothiazide Other (See Comments)    Severe hyponatremia    Tylenol  [Acetaminophen ]     Pt reports long time ago she got sick on tylenol  she not sure what happen but takes Tylenol  now.   "

## 2024-09-06 NOTE — Progress Notes (Signed)
 Transition of Care [facility]) - Inpatient Brief Assessment   Patient Details  Name: Krystal Maddox MRN: [id number] Date of Birth: 06-24-1941  [facility]) CM/SW Contact:    Rosaline JONELLE Joe, RN Phone Number: 09/06/2024, 4:09 PM   Clinical Narrative: Patient is active with [facility].  No IP Care management needs at this time.  Patient will likely need transport home by [facility] when stable.   Transition of Care Asessment: Insurance and Status: (P) Insurance coverage has been reviewed Patient has primary care physician: (P) Yes Home environment has been reviewed: (P) from home with family Prior level of function:: (P) family assistance - [facility] Prior/Current Home Services: (P) Current home services Social Drivers of Health Review: (P) SDOH reviewed no interventions necessary Readmission risk has been reviewed: (P) Yes Transition of care needs: (P) no transition of care needs at this time

## 2024-09-06 NOTE — Hospital Course (Signed)
 Krystal Maddox is an 83 y.o. 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   She reports progressive fatigue and exertional shortness of breath, prompting outpatient evaluation by [facility] prior to admission. Laboratory testing revealed a hemoglobin of 6.9 g/dL (down from 9.2 g/dL in July 7974), so she was instructed to present to the ED for further evaluation and blood transfusion. She denies melena, hematochezia, or overt bleeding. In the ED, VSS, negative FOBT. Labs confirmed microcytic anemia (Hgb 7.2 g/dL, MCV 75) with thrombocytosis, leukocytosis, and worsened renal function (Cr 1.57, baseline ~1.0).    GI was consulted formally by EDP, Dr. Wilhelmenia recommended admission for diagnostic workup on 12/30. 1u RBCs ordered by EDP.  VCE commenced on 12/30.   Assessment and Plan  Symptomatic acute/progressive iron deficiency anemia - s/p 1 unit PRBC - s/p dose of IV iron, would also likely benefit from IV iron referral at discharge with enteral intolerance and severity of deficiency.  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 Will continue PPI by IV. Avoiding NSAIDs, EtOH.    HTN - BP up, will continue hydralazine  50mg  TID per outpatient cardiology notes pending formal med rec. This is the only medication reported in dispense report. Note DC summary from July 2025 said stopped ARB/HCTZ, continue new norvasc , prn lasix .    s/p TAVR - Typically would be on DAPT x3-6 months then indefinite ASA 81mg . Given concern for GI bleeding, was placed on hold. Would ideally be able to restart if no high-risk/bleeding lesions identified. May need discussion amongst GI and  cardiology (sees Dr. Azobou Tonleu [facility]) going forward. Other meds pending formal med rec.    AKI on stage IIIb CKD - patient has history of CKD3b. Baseline creat ~ 1 - 1.2, eGFR~ 43 - patient presents with increase in creat >0.3 mg/dL above baseline or creat increase >1.5x baseline presumed to have occurred within past 7 days PTA - creat 1.54 on admission; improved with fluids    PAD: No critical ischemia noted.  - Restart statin once confirmed   Chronic hyponatremia: Na currently wnl. Suspicion was that this was due to HCTZ which is on allergies list now.   Well-controlled T2DM:  - Will give very sensitive SSI for now, hold any home medications she may be on   Thrombocytosis: Likely reactive to iron deficiency anemia   Leukocytosis: Without fever or source of infection noted at this time. Will monitor off antimicrobial treatment at this time.

## 2024-09-06 NOTE — ED Notes (Signed)
 Patient ambulated to private restroom in ER room with steady gait.

## 2024-09-06 NOTE — ED Notes (Signed)
Assisted pt with bedpan. Pt tolerated well.

## 2024-09-07 DIAGNOSIS — K31819 Angiodysplasia of stomach and duodenum without bleeding: Secondary | ICD-10-CM | POA: Diagnosis not present

## 2024-09-07 DIAGNOSIS — D649 Anemia, unspecified: Secondary | ICD-10-CM | POA: Diagnosis not present

## 2024-09-07 DIAGNOSIS — K921 Melena: Secondary | ICD-10-CM

## 2024-09-07 DIAGNOSIS — D500 Iron deficiency anemia secondary to blood loss (chronic): Secondary | ICD-10-CM | POA: Diagnosis not present

## 2024-09-07 DIAGNOSIS — D62 Acute posthemorrhagic anemia: Secondary | ICD-10-CM

## 2024-09-07 LAB — BASIC METABOLIC PANEL WITH GFR
Anion gap: 11 (ref 5–15)
BUN: 24 mg/dL — ABNORMAL HIGH (ref 8–23)
CO2: 21 mmol/L — ABNORMAL LOW (ref 22–32)
Calcium: 8.6 mg/dL — ABNORMAL LOW (ref 8.9–10.3)
Chloride: 104 mmol/L (ref 98–111)
Creatinine, Ser: 1.15 mg/dL — ABNORMAL HIGH (ref 0.44–1.00)
GFR, Estimated: 47 mL/min — ABNORMAL LOW
Glucose, Bld: 122 mg/dL — ABNORMAL HIGH (ref 70–99)
Potassium: 4 mmol/L (ref 3.5–5.1)
Sodium: 136 mmol/L (ref 135–145)

## 2024-09-07 LAB — MAGNESIUM: Magnesium: 2.1 mg/dL (ref 1.7–2.4)

## 2024-09-07 LAB — CBC WITH DIFFERENTIAL/PLATELET
Abs Immature Granulocytes: 0.73 K/uL — ABNORMAL HIGH (ref 0.00–0.07)
Basophils Absolute: 0.1 K/uL (ref 0.0–0.1)
Basophils Relative: 1 %
Eosinophils Absolute: 0.2 K/uL (ref 0.0–0.5)
Eosinophils Relative: 2 %
HCT: 25 % — ABNORMAL LOW (ref 36.0–46.0)
Hemoglobin: 7.9 g/dL — ABNORMAL LOW (ref 12.0–15.0)
Immature Granulocytes: 2 %
Lymphocytes Relative: 15 %
Lymphs Abs: 1.5 K/uL (ref 0.7–4.0)
MCH: 23.6 pg — ABNORMAL LOW (ref 26.0–34.0)
MCHC: 31.6 g/dL (ref 30.0–36.0)
MCV: 74.6 fL — ABNORMAL LOW (ref 80.0–100.0)
Monocytes Absolute: 0.9 K/uL (ref 0.1–1.0)
Monocytes Relative: 8 %
Neutro Abs: 7 K/uL (ref 1.7–7.7)
Neutrophils Relative %: 72 %
Platelets: 363 K/uL (ref 150–400)
RBC: 3.35 MIL/uL — ABNORMAL LOW (ref 3.87–5.11)
RDW: 19.6 % — ABNORMAL HIGH (ref 11.5–15.5)
Smear Review: NORMAL
WBC: 10.5 K/uL (ref 4.0–10.5)
nRBC: 0 % (ref 0.0–0.2)

## 2024-09-07 LAB — GLUCOSE, CAPILLARY
Glucose-Capillary: 107 mg/dL — ABNORMAL HIGH (ref 70–99)
Glucose-Capillary: 109 mg/dL — ABNORMAL HIGH (ref 70–99)
Glucose-Capillary: 117 mg/dL — ABNORMAL HIGH (ref 70–99)
Glucose-Capillary: 120 mg/dL — ABNORMAL HIGH (ref 70–99)
Glucose-Capillary: 124 mg/dL — ABNORMAL HIGH (ref 70–99)
Glucose-Capillary: 129 mg/dL — ABNORMAL HIGH (ref 70–99)
Glucose-Capillary: 217 mg/dL — ABNORMAL HIGH (ref 70–99)

## 2024-09-07 MED ORDER — LACTATED RINGERS IV SOLN: INTRAVENOUS | Status: DC

## 2024-09-07 MED ORDER — LACTATED RINGERS IV BOLUS
500.0000 mL | INTRAVENOUS | Status: AC
  Administered 2024-09-08: 500 mL via INTRAVENOUS

## 2024-09-07 MED ORDER — TRAZODONE HCL 50 MG PO TABS
50.0000 mg | ORAL_TABLET | Freq: Every evening | ORAL | Status: DC | PRN
  Filled 2024-09-07: qty 1

## 2024-09-07 NOTE — Progress Notes (Signed)
 " Progress Note    Krystal Maddox   FMW:[id number]  DOB: 1940/12/13  DOA: 09/05/2024     0 PCP: Cloria Annabella CROME, DO  Initial CC: anemia  Hospital Course: Krystal Maddox is an 83 y.o. 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   She reports progressive fatigue and exertional shortness of breath, prompting outpatient evaluation by [facility] prior to admission. Laboratory testing revealed a hemoglobin of 6.9 g/dL (down from 9.2 g/dL in July 7974), so she was instructed to present to the ED for further evaluation and blood transfusion. She denies melena, hematochezia, or overt bleeding. In the ED, VSS, negative FOBT. Labs confirmed microcytic anemia (Hgb 7.2 g/dL, MCV 75) with thrombocytosis, leukocytosis, and worsened renal function (Cr 1.57, baseline ~1.0).    GI was consulted formally by EDP, Dr. Wilhelmenia recommended admission for diagnostic workup on 12/30. 1u RBCs ordered by EDP.  VCE commenced on 12/30.   Assessment and Plan  Symptomatic acute/progressive iron deficiency anemia - s/p 1 unit PRBC - s/p dose of IV iron, would also likely benefit from IV iron referral at discharge with enteral intolerance and severity of deficiency.  - Due to suspicion for cryptogenic GI source despite recent colonoscopy (single AVM Tx w/APC and clip, polyps without dysplasia) and EGD (chronic inactive, H. pylori negative gastritis, friable mucosa) in July - s/p VCE on 12/30; follow up results - continue PPI    HTN - controlled; continue hydralazine    s/p TAVR - Typically would be on DAPT x3-6 months then indefinite ASA 81mg . Given concern for GI bleeding, was placed on hold. Would ideally be able to restart if no high-risk/bleeding lesions identified. May need discussion amongst GI and cardiology (sees Dr. Azobou Tonleu [facility]) going forward. Other meds pending formal med  rec.    AKI on stage IIIb CKD - patient has history of CKD3b. Baseline creat ~ 1 - 1.2, eGFR~ 43 - patient presents with increase in creat >0.3 mg/dL above baseline or creat increase >1.5x baseline presumed to have occurred within past 7 days PTA - creat 1.54 on admission; improved with fluids    PAD: No critical ischemia noted.  - Restart statin once confirmed   Chronic hyponatremia: Na currently wnl. Suspicion was that this was due to HCTZ which is on allergies list now.   DMII  - SSI and CBGs now    Thrombocytosis: Likely reactive to iron deficiency anemia   Leukocytosis: Without fever or source of infection noted at this time. Will monitor off antimicrobial treatment at this time.   Interval History:  No events overnight.  Sitting up in bed with daughter present bedside this morning.  Interpreter available in person and assisting in room during exam this morning. She feels otherwise back to normal and endorses passing mostly brown stool a little chocolate color.  Antimicrobials:   DVT prophylaxis:  SCDs Start: 09/06/24 0123   Code Status:   Code Status: Full Code  Mobility Assessment (Last 72 Hours)     Mobility Assessment     Row Name 09/07/24 0900 09/06/24 1930 09/06/24 1500       Does the patient have exclusion criteria? No- Perform mobility assessment No- Perform mobility assessment No- Perform mobility assessment     What is the highest level of mobility based on the mobility assessment? Level 4 (Ambulates with  assistance) - Balance while stepping forward/back - Complete Level 4 (Ambulates with assistance) - Balance while stepping forward/back - Complete Level 4 (Ambulates with assistance) - Balance while stepping forward/back - Complete        Diet: Diet Orders (From admission, onward)     Start     Ordered   09/06/24 1750  Diet Carb Modified Room service appropriate? Yes  Diet effective now       Question Answer Comment  Diet-HS Snack? Nothing   Calorie  Level Medium 1600-2000   Fluid consistency: Thin   Room service appropriate? Yes      09/06/24 1749            Barriers to discharge: None Disposition Plan: Home HH orders placed: N/A Status is: Observation  Objective: Blood pressure (!) 137/46, pulse 76, temperature 98.9 F (37.2 C), temperature source Oral, resp. rate 17, SpO2 97%.  Examination:  Physical Exam Constitutional:      Appearance: Normal appearance.  HENT:     Head: Normocephalic and atraumatic.     Mouth/Throat:     Mouth: Mucous membranes are moist.  Eyes:     Extraocular Movements: Extraocular movements intact.  Cardiovascular:     Rate and Rhythm: Normal rate and regular rhythm.  Pulmonary:     Effort: Pulmonary effort is normal. No respiratory distress.     Breath sounds: Normal breath sounds. No wheezing.  Abdominal:     General: Bowel sounds are normal. There is no distension.     Palpations: Abdomen is soft.     Tenderness: There is no abdominal tenderness.  Musculoskeletal:        General: Normal range of motion.     Cervical back: Normal range of motion and neck supple.  Skin:    General: Skin is warm and dry.  Neurological:     General: No focal deficit present.     Mental Status: She is alert.  Psychiatric:        Mood and Affect: Mood normal.      Consultants:  GI  Procedures:  09/06/2024: Video capsule endoscopy  Data Reviewed: Results for orders placed or performed during the hospital encounter of 09/05/24 (from the past 24 hours)  Glucose, capillary     Status: Abnormal   Collection Time: 09/06/24  5:58 PM  Result Value Ref Range   Glucose-Capillary 109 (H) 70 - 99 mg/dL  Glucose, capillary     Status: Abnormal   Collection Time: 09/06/24  8:28 PM  Result Value Ref Range   Glucose-Capillary 134 (H) 70 - 99 mg/dL  Glucose, capillary     Status: Abnormal   Collection Time: 09/07/24 12:30 AM  Result Value Ref Range   Glucose-Capillary 107 (H) 70 - 99 mg/dL  Glucose,  capillary     Status: Abnormal   Collection Time: 09/07/24  4:14 AM  Result Value Ref Range   Glucose-Capillary 120 (H) 70 - 99 mg/dL  Basic metabolic panel with GFR     Status: Abnormal   Collection Time: 09/07/24  4:41 AM  Result Value Ref Range   Sodium 136 135 - 145 mmol/L   Potassium 4.0 3.5 - 5.1 mmol/L   Chloride 104 98 - 111 mmol/L   CO2 21 (L) 22 - 32 mmol/L   Glucose, Bld 122 (H) 70 - 99 mg/dL   BUN 24 (H) 8 - 23 mg/dL   Creatinine, Ser 8.84 (H) 0.44 - 1.00 mg/dL   Calcium 8.6 (L) 8.9 -  10.3 mg/dL   GFR, Estimated 47 (L) >60 mL/min   Anion gap 11 5 - 15  CBC with Differential/Platelet     Status: Abnormal   Collection Time: 09/07/24  4:41 AM  Result Value Ref Range   WBC 10.5 4.0 - 10.5 K/uL   RBC 3.35 (L) 3.87 - 5.11 MIL/uL   Hemoglobin 7.9 (L) 12.0 - 15.0 g/dL   HCT 74.9 (L) 63.9 - 53.9 %   MCV 74.6 (L) 80.0 - 100.0 fL   MCH 23.6 (L) 26.0 - 34.0 pg   MCHC 31.6 30.0 - 36.0 g/dL   RDW 80.3 (H) 88.4 - 84.4 %   Platelets 363 150 - 400 K/uL   nRBC 0.0 0.0 - 0.2 %   Neutrophils Relative % 72 %   Neutro Abs 7.0 1.7 - 7.7 K/uL   Lymphocytes Relative 15 %   Lymphs Abs 1.5 0.7 - 4.0 K/uL   Monocytes Relative 8 %   Monocytes Absolute 0.9 0.1 - 1.0 K/uL   Eosinophils Relative 2 %   Eosinophils Absolute 0.2 0.0 - 0.5 K/uL   Basophils Relative 1 %   Basophils Absolute 0.1 0.0 - 0.1 K/uL   WBC Morphology MORPHOLOGY UNREMARKABLE    Smear Review Normal platelet morphology    Immature Granulocytes 2 %   Abs Immature Granulocytes 0.73 (H) 0.00 - 0.07 K/uL   Polychromasia PRESENT   Magnesium      Status: None   Collection Time: 09/07/24  4:41 AM  Result Value Ref Range   Magnesium  2.1 1.7 - 2.4 mg/dL  Glucose, capillary     Status: Abnormal   Collection Time: 09/07/24  7:38 AM  Result Value Ref Range   Glucose-Capillary 109 (H) 70 - 99 mg/dL  Glucose, capillary     Status: Abnormal   Collection Time: 09/07/24 12:09 PM  Result Value Ref Range   Glucose-Capillary 129 (H)  70 - 99 mg/dL    I have reviewed pertinent nursing notes, vitals, labs, and images as necessary. I have ordered labwork to follow up on as indicated.  I have reviewed the last notes from staff over past 24 hours. I have discussed patient's care plan and test results with nursing staff, CM/SW, and other staff as appropriate.  Old records reviewed in assessment of this patient  Time spent: Greater than 50% of the 55 minute visit was spent in counseling/coordination of care for the patient as laid out in the A&P.   LOS: 0 days   Alm Apo, MD [facility] 09/07/2024, 1:51 PM "

## 2024-09-07 NOTE — Plan of Care (Signed)

## 2024-09-07 NOTE — Progress Notes (Addendum)
 Patient ID: [place], female   DOB: 1941/06/11, 83 y.o.   MRN: [id number]    Progress Note   Subjective   Day # 2 CC; iron deficiency anemia, persistent, with drop in hemoglobin, Hemoccult negative on admission   transfused 1 unit packed RBCs, Has received IV iron today  Capsule endoscopy interpreted today-shows active oozing/bleeding in the very proximal small bowel within 1 minute of the first duodenal image, there is an AVM in this area which may be the source for bleeding.  Much of the remainder of the small bowel has dark melenic appearing material which somewhat limits visualization but no other areas of active bleeding  Labs today hemoglobin 7.9/hematocrit 25.0 Potassium 4.0/BUN 24/creatinine 1.15  Patient sitting up on the side of the bed, in good spirits, granddaughter at the bedside, conversation was had with phone interpreter Patient has no current complaints, concerned about whether she would get dinner this evening.  No evidence of active bleeding since admission    Objective   Vital signs in last 24 hours: Temp:  [97.4 F (36.3 C)-99.3 F (37.4 C)] 98.9 F (37.2 C) (12/31 1208) Pulse Rate:  [76-86] 76 (12/31 1208) Resp:  [16-20] 17 (12/31 1208) BP: (104-159)/(41-88) 137/46 (12/31 1208) SpO2:  [95 %-100 %] 97 % (12/31 1208) Last BM Date : 09/06/24 General:    Elderly Asian female in NAD non-English-speaking Heart:  Regular rate and rhythm; no murmurs Lungs: Respirations even and unlabored, lungs CTA bilaterally Abdomen:  Soft, nontender and nondistended. Normal bowel sounds. Extremities:  Without edema. Neurologic:  Alert and oriented,  grossly normal neurologically. Psych:  Cooperative. Normal mood and affect.  Intake/Output from previous day: 12/30 0701 - 12/31 0700 In: 1750.3 [P.O.:840; I.V.:800.3; IV Piggyback:110] Out: -  Intake/Output this shift: Total I/O In: 480 [P.O.:480] Out: -   Lab Results: Recent Labs    09/05/24 1759 09/06/24 0406  09/07/24 0441  WBC 14.4* 12.5* 10.5  HGB 7.2* 8.1* 7.9*  HCT 24.0* 25.8* 25.0*  PLT 452* 355 363   BMET Recent Labs    09/05/24 1759 09/06/24 0406 09/07/24 0441  NA 137 139 136  K 3.6 3.3* 4.0  CL 99 105 104  CO2 26 25 21*  GLUCOSE 200* 121* 122*  BUN 37* 30* 24*  CREATININE 1.57* 1.24* 1.15*  CALCIUM 8.8* 8.3* 8.6*   LFT Recent Labs    09/05/24 1759  PROT 8.4*  ALBUMIN 4.0  AST 28  ALT 12  ALKPHOS 75  BILITOT 0.3   PT/INR Recent Labs    09/05/24 0144  LABPROT 13.3  INR 1.0        Assessment / Plan:    #12 83 year old female non English speaking [place] female with history of persistent iron deficiency anemia and found on outpatient labs to have had a 2 g drop in hemoglobin with hemoglobin on admit 6.9.  She was documented Hemoccult negative on admission  She had undergone recent EGD and colonoscopy in July 2025 for this problem, she did have APC of a single colonic AVM, and had 2 diminutive polyps removed and 3 adenomatous polyps removed.  At EGD just had gastritis and 1 focal erosion on the cardia side of the EGJ.  Biopsies negative for H. pylori.  Patient has been transfused x 1 this admission, and received IV iron today  Hemoglobin 7.9  Capsule endoscopy does show active bleeding in the very proximal small bowel just distal to the first duodenal image.  Suspect this is secondary to  an AVM.  There is no active bleeding in the remainder of the small bowel but much of the small bowel has dark melenic fluid present which somewhat limits visualization of the mucosa.  #2 chronic constipation-just darted Linzess 3.  Chronic kidney disease 4.  Peripheral arterial disease 5.  Mild pulmonary hypertension 6.  Moderate to severe mitral regurgitation 7.  GERD  Plan okay for regular diet this evening, n.p.o. past midnight Patient will be scheduled for small bowel endoscopy with Dr. Legrand for tomorrow 09/09/2023.  Procedure was discussed in detail today at bedside via  the phone interpreter, all questions were answered, we reviewed indications risks and benefits and she is agreeable to proceed.  Hopefully she will be able to be discharged tomorrow postprocedure. She will need closer follow-up with serial hemoglobins every 2 to 3 weeks over the upcoming weeks, and will still need referral to hematology for the chronic iron deficiency and further IV iron infusions as indicated. Further recommendations pending findings at enteroscopy   Principal Problem:   Symptomatic anemia Active Problems:   Peripheral arterial disease   S/P TAVR (transcatheter aortic valve replacement)   Hypertension   Iron deficiency anemia due to chronic blood loss     LOS: 0 days   Amy Esterwood PA-C 09/07/2024, 4:55 PM  I have taken an interval history, thoroughly reviewed the chart and examined the patient. I agree with the Advanced Practitioner's note, impression and recommendations, and have recorded additional findings, impressions and recommendations below. I performed a substantive portion of this encounter (>50% time spent), including a complete performance of the medical decision making.  My additional thoughts are as follows:  Exam remains unchanged.  Patient hemodynamically stable Hemoglobin 7.9 this morning from 8.1 yesterday  Proximal small bowel bleeding identified on video capsule study, so patient is on schedule for small bowel enteroscopy with me tomorrow.  Timing TBD (holiday schedule for anesthesia/endoscopy)  Patient at high risk for cardiopulmonary complications of procedure due to medical comorbidities.  _________________  This consultation required a high degree of medical decision making due to the nature and complexity of the acute condition(s) being evaluated as well as the patient's medical comorbidities.    Victory LITTIE Legrand III Office:[phone]

## 2024-09-07 NOTE — Progress Notes (Signed)
 Patient's signed consent for procedure added to file.

## 2024-09-07 NOTE — H&P (View-Only) (Signed)
 Patient ID: Krystal Maddox, female   DOB: 1941/06/11, 83 y.o.   MRN: [id number]    Progress Note   Subjective   Day # 2 CC; iron deficiency anemia, persistent, with drop in hemoglobin, Hemoccult negative on admission   transfused 1 unit packed RBCs, Has received IV iron today  Capsule endoscopy interpreted today-shows active oozing/bleeding in the very proximal small bowel within 1 minute of the first duodenal image, there is an AVM in this area which may be the source for bleeding.  Much of the remainder of the small bowel has dark melenic appearing material which somewhat limits visualization but no other areas of active bleeding  Labs today hemoglobin 7.9/hematocrit 25.0 Potassium 4.0/BUN 24/creatinine 1.15  Patient sitting up on the side of the bed, in good spirits, granddaughter at the bedside, conversation was had with phone interpreter Patient has no current complaints, concerned about whether she would get dinner this evening.  No evidence of active bleeding since admission    Objective   Vital signs in last 24 hours: Temp:  [97.4 F (36.3 C)-99.3 F (37.4 C)] 98.9 F (37.2 C) (12/31 1208) Pulse Rate:  [76-86] 76 (12/31 1208) Resp:  [16-20] 17 (12/31 1208) BP: (104-159)/(41-88) 137/46 (12/31 1208) SpO2:  [95 %-100 %] 97 % (12/31 1208) Last BM Date : 09/06/24 General:    Elderly Asian female in NAD non-English-speaking Heart:  Regular rate and rhythm; no murmurs Lungs: Respirations even and unlabored, lungs CTA bilaterally Abdomen:  Soft, nontender and nondistended. Normal bowel sounds. Extremities:  Without edema. Neurologic:  Alert and oriented,  grossly normal neurologically. Psych:  Cooperative. Normal mood and affect.  Intake/Output from previous day: 12/30 0701 - 12/31 0700 In: 1750.3 [P.O.:840; I.V.:800.3; IV Piggyback:110] Out: -  Intake/Output this shift: Total I/O In: 480 [P.O.:480] Out: -   Lab Results: Recent Labs    09/05/24 1759 09/06/24 0406  09/07/24 0441  WBC 14.4* 12.5* 10.5  HGB 7.2* 8.1* 7.9*  HCT 24.0* 25.8* 25.0*  PLT 452* 355 363   BMET Recent Labs    09/05/24 1759 09/06/24 0406 09/07/24 0441  NA 137 139 136  K 3.6 3.3* 4.0  CL 99 105 104  CO2 26 25 21*  GLUCOSE 200* 121* 122*  BUN 37* 30* 24*  CREATININE 1.57* 1.24* 1.15*  CALCIUM 8.8* 8.3* 8.6*   LFT Recent Labs    09/05/24 1759  PROT 8.4*  ALBUMIN 4.0  AST 28  ALT 12  ALKPHOS 75  BILITOT 0.3   PT/INR Recent Labs    09/05/24 0144  LABPROT 13.3  INR 1.0        Assessment / Plan:    #12 83 year old female non English speaking [place] female with history of persistent iron deficiency anemia and found on outpatient labs to have had a 2 g drop in hemoglobin with hemoglobin on admit 6.9.  She was documented Hemoccult negative on admission  She had undergone recent EGD and colonoscopy in July 2025 for this problem, she did have APC of a single colonic AVM, and had 2 diminutive polyps removed and 3 adenomatous polyps removed.  At EGD just had gastritis and 1 focal erosion on the cardia side of the EGJ.  Biopsies negative for H. pylori.  Patient has been transfused x 1 this admission, and received IV iron today  Hemoglobin 7.9  Capsule endoscopy does show active bleeding in the very proximal small bowel just distal to the first duodenal image.  Suspect this is secondary to  an AVM.  There is no active bleeding in the remainder of the small bowel but much of the small bowel has dark melenic fluid present which somewhat limits visualization of the mucosa.  #2 chronic constipation-just darted Linzess 3.  Chronic kidney disease 4.  Peripheral arterial disease 5.  Mild pulmonary hypertension 6.  Moderate to severe mitral regurgitation 7.  GERD  Plan okay for regular diet this evening, n.p.o. past midnight Patient will be scheduled for small bowel endoscopy with Dr. Legrand for tomorrow 09/09/2023.  Procedure was discussed in detail today at bedside via  the phone interpreter, all questions were answered, we reviewed indications risks and benefits and she is agreeable to proceed.  Hopefully she will be able to be discharged tomorrow postprocedure. She will need closer follow-up with serial hemoglobins every 2 to 3 weeks over the upcoming weeks, and will still need referral to hematology for the chronic iron deficiency and further IV iron infusions as indicated. Further recommendations pending findings at enteroscopy   Principal Problem:   Symptomatic anemia Active Problems:   Peripheral arterial disease   S/P TAVR (transcatheter aortic valve replacement)   Hypertension   Iron deficiency anemia due to chronic blood loss     LOS: 0 days   Amy Esterwood PA-C 09/07/2024, 4:55 PM  I have taken an interval history, thoroughly reviewed the chart and examined the patient. I agree with the Advanced Practitioner's note, impression and recommendations, and have recorded additional findings, impressions and recommendations below. I performed a substantive portion of this encounter (>50% time spent), including a complete performance of the medical decision making.  My additional thoughts are as follows:  Exam remains unchanged.  Patient hemodynamically stable Hemoglobin 7.9 this morning from 8.1 yesterday  Proximal small bowel bleeding identified on video capsule study, so patient is on schedule for small bowel enteroscopy with me tomorrow.  Timing TBD (holiday schedule for anesthesia/endoscopy)  Patient at high risk for cardiopulmonary complications of procedure due to medical comorbidities.  _________________  This consultation required a high degree of medical decision making due to the nature and complexity of the acute condition(s) being evaluated as well as the patient's medical comorbidities.    Victory LITTIE Legrand III Office:[phone]

## 2024-09-08 ENCOUNTER — Inpatient Hospital Stay (HOSPITAL_COMMUNITY): Payer: Medicare (Managed Care) | Admitting: Anesthesiology

## 2024-09-08 ENCOUNTER — Encounter (HOSPITAL_COMMUNITY)
Admission: EM | Disposition: A | Discharge status: Home / Self Care | Payer: Self-pay | Source: Home / Self Care | Attending: Internal Medicine

## 2024-09-08 ENCOUNTER — Encounter (HOSPITAL_COMMUNITY): Payer: Self-pay | Admitting: Internal Medicine

## 2024-09-08 DIAGNOSIS — Z794 Long term (current) use of insulin: Secondary | ICD-10-CM | POA: Diagnosis not present

## 2024-09-08 DIAGNOSIS — N179 Acute kidney failure, unspecified: Secondary | ICD-10-CM | POA: Diagnosis present

## 2024-09-08 DIAGNOSIS — Z860100 Personal history of colon polyps, unspecified: Secondary | ICD-10-CM | POA: Diagnosis not present

## 2024-09-08 DIAGNOSIS — D500 Iron deficiency anemia secondary to blood loss (chronic): Secondary | ICD-10-CM | POA: Diagnosis not present

## 2024-09-08 DIAGNOSIS — K31819 Angiodysplasia of stomach and duodenum without bleeding: Secondary | ICD-10-CM | POA: Diagnosis not present

## 2024-09-08 DIAGNOSIS — N1831 Chronic kidney disease, stage 3a: Secondary | ICD-10-CM

## 2024-09-08 DIAGNOSIS — I129 Hypertensive chronic kidney disease with stage 1 through stage 4 chronic kidney disease, or unspecified chronic kidney disease: Secondary | ICD-10-CM

## 2024-09-08 DIAGNOSIS — K31811 Angiodysplasia of stomach and duodenum with bleeding: Secondary | ICD-10-CM | POA: Diagnosis present

## 2024-09-08 DIAGNOSIS — Q2733 Arteriovenous malformation of digestive system vessel: Secondary | ICD-10-CM

## 2024-09-08 DIAGNOSIS — K5909 Other constipation: Secondary | ICD-10-CM | POA: Diagnosis present

## 2024-09-08 DIAGNOSIS — Z886 Allergy status to analgesic agent status: Secondary | ICD-10-CM | POA: Diagnosis not present

## 2024-09-08 DIAGNOSIS — Z888 Allergy status to other drugs, medicaments and biological substances status: Secondary | ICD-10-CM | POA: Diagnosis not present

## 2024-09-08 DIAGNOSIS — E1151 Type 2 diabetes mellitus with diabetic peripheral angiopathy without gangrene: Secondary | ICD-10-CM | POA: Diagnosis present

## 2024-09-08 DIAGNOSIS — D509 Iron deficiency anemia, unspecified: Secondary | ICD-10-CM | POA: Diagnosis present

## 2024-09-08 DIAGNOSIS — N1832 Chronic kidney disease, stage 3b: Secondary | ICD-10-CM | POA: Diagnosis present

## 2024-09-08 DIAGNOSIS — K219 Gastro-esophageal reflux disease without esophagitis: Secondary | ICD-10-CM | POA: Diagnosis present

## 2024-09-08 DIAGNOSIS — Z7984 Long term (current) use of oral hypoglycemic drugs: Secondary | ICD-10-CM | POA: Diagnosis not present

## 2024-09-08 DIAGNOSIS — E871 Hypo-osmolality and hyponatremia: Secondary | ICD-10-CM | POA: Diagnosis present

## 2024-09-08 DIAGNOSIS — K921 Melena: Secondary | ICD-10-CM

## 2024-09-08 DIAGNOSIS — D75839 Thrombocytosis, unspecified: Secondary | ICD-10-CM | POA: Diagnosis present

## 2024-09-08 DIAGNOSIS — Z603 Acculturation difficulty: Secondary | ICD-10-CM | POA: Diagnosis present

## 2024-09-08 DIAGNOSIS — D649 Anemia, unspecified: Secondary | ICD-10-CM | POA: Diagnosis present

## 2024-09-08 DIAGNOSIS — Z953 Presence of xenogenic heart valve: Secondary | ICD-10-CM | POA: Diagnosis not present

## 2024-09-08 DIAGNOSIS — I340 Nonrheumatic mitral (valve) insufficiency: Secondary | ICD-10-CM | POA: Diagnosis present

## 2024-09-08 DIAGNOSIS — E1122 Type 2 diabetes mellitus with diabetic chronic kidney disease: Secondary | ICD-10-CM | POA: Diagnosis present

## 2024-09-08 DIAGNOSIS — Z79899 Other long term (current) drug therapy: Secondary | ICD-10-CM | POA: Diagnosis not present

## 2024-09-08 DIAGNOSIS — Z8249 Family history of ischemic heart disease and other diseases of the circulatory system: Secondary | ICD-10-CM | POA: Diagnosis not present

## 2024-09-08 DIAGNOSIS — D62 Acute posthemorrhagic anemia: Secondary | ICD-10-CM | POA: Diagnosis present

## 2024-09-08 DIAGNOSIS — I272 Pulmonary hypertension, unspecified: Secondary | ICD-10-CM | POA: Diagnosis present

## 2024-09-08 DIAGNOSIS — D72829 Elevated white blood cell count, unspecified: Secondary | ICD-10-CM | POA: Diagnosis present

## 2024-09-08 HISTORY — PX: ENTEROSCOPY: SHX5533

## 2024-09-08 HISTORY — PX: SUBMUCOSAL INJECTION: SHX5543

## 2024-09-08 HISTORY — PX: HOT HEMOSTASIS: SHX5433

## 2024-09-08 LAB — BASIC METABOLIC PANEL WITH GFR
Anion gap: 9 (ref 5–15)
BUN: 28 mg/dL — ABNORMAL HIGH (ref 8–23)
CO2: 22 mmol/L (ref 22–32)
Calcium: 8.9 mg/dL (ref 8.9–10.3)
Chloride: 106 mmol/L (ref 98–111)
Creatinine, Ser: 1.29 mg/dL — ABNORMAL HIGH (ref 0.44–1.00)
GFR, Estimated: 41 mL/min — ABNORMAL LOW
Glucose, Bld: 122 mg/dL — ABNORMAL HIGH (ref 70–99)
Potassium: 4.2 mmol/L (ref 3.5–5.1)
Sodium: 138 mmol/L (ref 135–145)

## 2024-09-08 LAB — CBC
HCT: 23.2 % — ABNORMAL LOW (ref 36.0–46.0)
Hemoglobin: 7.3 g/dL — ABNORMAL LOW (ref 12.0–15.0)
MCH: 23.6 pg — ABNORMAL LOW (ref 26.0–34.0)
MCHC: 31.5 g/dL (ref 30.0–36.0)
MCV: 75.1 fL — ABNORMAL LOW (ref 80.0–100.0)
Platelets: 348 K/uL (ref 150–400)
RBC: 3.09 MIL/uL — ABNORMAL LOW (ref 3.87–5.11)
RDW: 20.4 % — ABNORMAL HIGH (ref 11.5–15.5)
WBC: 11.3 K/uL — ABNORMAL HIGH (ref 4.0–10.5)
nRBC: 0.2 % (ref 0.0–0.2)

## 2024-09-08 LAB — GLUCOSE, CAPILLARY
Glucose-Capillary: 115 mg/dL — ABNORMAL HIGH (ref 70–99)
Glucose-Capillary: 125 mg/dL — ABNORMAL HIGH (ref 70–99)
Glucose-Capillary: 131 mg/dL — ABNORMAL HIGH (ref 70–99)
Glucose-Capillary: 131 mg/dL — ABNORMAL HIGH (ref 70–99)

## 2024-09-08 LAB — HEMOGLOBIN AND HEMATOCRIT, BLOOD
HCT: 32.1 % — ABNORMAL LOW (ref 36.0–46.0)
Hemoglobin: 10.5 g/dL — ABNORMAL LOW (ref 12.0–15.0)

## 2024-09-08 LAB — PREPARE RBC (CROSSMATCH)

## 2024-09-08 SURGERY — ENTEROSCOPY
Anesthesia: Monitor Anesthesia Care

## 2024-09-08 MED ORDER — FERROUS SULFATE 325 (65 FE) MG PO TABS: 325.0000 mg | ORAL_TABLET | Freq: Every day | ORAL | Status: AC

## 2024-09-08 MED ORDER — GLUCAGON HCL RDNA (DIAGNOSTIC) 1 MG IJ SOLR
INTRAMUSCULAR | Status: DC | PRN
  Administered 2024-09-08 (×2): .5 mg via INTRAVENOUS

## 2024-09-08 MED ORDER — LIDOCAINE 2% (20 MG/ML) 5 ML SYRINGE
INTRAMUSCULAR | Status: DC | PRN
  Administered 2024-09-08: 60 mg via INTRAVENOUS

## 2024-09-08 MED ORDER — PROPOFOL 10 MG/ML IV BOLUS
INTRAVENOUS | Status: DC | PRN
  Administered 2024-09-08: 10 mg via INTRAVENOUS
  Administered 2024-09-08: 30 mg via INTRAVENOUS
  Administered 2024-09-08: 10 mg via INTRAVENOUS

## 2024-09-08 MED ORDER — SODIUM CHLORIDE 0.9% IV SOLUTION: Freq: Once | INTRAVENOUS | Status: AC

## 2024-09-08 MED ORDER — SPOT INK MARKER SYRINGE KIT
PACK | SUBMUCOSAL | Status: DC | PRN
  Administered 2024-09-08: 1 mL via SUBMUCOSAL

## 2024-09-08 MED ORDER — FERROUS SULFATE 325 (65 FE) MG PO TABS: 325.0000 mg | ORAL_TABLET | Freq: Every day | ORAL | Status: DC

## 2024-09-08 MED ORDER — PROPOFOL 500 MG/50ML IV EMUL
INTRAVENOUS | Status: DC | PRN
  Administered 2024-09-08: 120 ug/kg/min via INTRAVENOUS

## 2024-09-08 NOTE — Interval H&P Note (Signed)
 History and Physical Interval Note:  09/08/2024 12:23 PM  Krystal Maddox  has presented today for surgery, with the diagnosis of Anemia, iron deficiency, active bleeding on capsule endoscopy and proximal small bowel.  The various methods of treatment have been discussed with the patient and family. After consideration of risks, benefits and other options for treatment, the patient has consented to  Procedures: ENTEROSCOPY (N/A) as a surgical intervention.  The patient's history has been reviewed, patient examined, no change in status, stable for surgery.  I have reviewed the patient's chart and labs.  Questions were answered to the patient's satisfaction.    Hgb low this AM, currently completing a unit of PRBCs and is hemodynamically stable  [name]

## 2024-09-08 NOTE — Op Note (Signed)
 [facility] Patient Name: Krystal Maddox Procedure Date : 09/08/2024 MRN: [id number] Attending MD: Victory CROME. Legrand , MD, [id number] Date of Birth: 04/18/1941 CSN: [id number] Age: 84 Admit Type: Inpatient Procedure:                Small bowel enteroscopy Indications:              Iron deficiency anemia secondary to chronic blood                            loss, Melena, Recurrent bleeding in the small bowel                           duodenal or proximal jejunal source seen actively                            bleeding on 09/06/24 video capsule study Providers:                Victory L. Legrand, MD, Olam Riedel, RN, Jasmine                            Pettiford, Technician, Lonni SAUNDERS, CRNA Referring MD:             [facility] Service Medicines:                Monitored Anesthesia Care Complications:            No immediate complications. Estimated Blood Loss:     Estimated blood loss was minimal. Procedure:                Pre-Anesthesia Assessment:                           - Prior to the procedure, a History and Physical                            was performed, and patient medications and                            allergies were reviewed. The patient's tolerance of                            previous anesthesia was also reviewed. The risks                            and benefits of the procedure and the sedation                            options and risks were discussed with the patient.                            All questions were answered, and informed consent                            was obtained. Prior Anticoagulants: The patient has  taken no anticoagulant or antiplatelet agents. ASA                            Grade Assessment: III - A patient with severe                            systemic disease. After reviewing the risks and                            benefits, the patient was deemed in satisfactory                            condition  to undergo the procedure.                           After obtaining informed consent, the endoscope was                            passed under direct vision. Throughout the                            procedure, the patient's blood pressure, pulse, and                            oxygen saturations were monitored continuously. The                            [id number]-HQ190L ([id number]) Olympus colonoscope was                            introduced through the mouth and advanced to the                            proximal jejunum (140cm of reduced scope). The                            [id number]-[id number] ([id number]) Olympus endoscope was introduced                            through the and advanced to the duodenum (used for                            additional hemostasis of bleeding source due to                            lesion location and scope mechanics after removal                            of the pedicatric colonoscope). The small bowel                            enteroscopy was accomplished without difficulty.  The patient tolerated the procedure well. Scope In: Scope Out: Findings:      The esophagus was normal.      The stomach was normal.      A single angioectasia with no bleeding was found in the second portion       of the duodenum/distal sweep (challenging location). This finding       coincides with the location of the recently bleeding source seen on VCE.       Coagulation for hemostasis using argon plasma was successful (APC on       right colon setting). That led to bleeding (oozing) from the lesion       treated with additional APC and clips. For additional hemostasis and       site closure, two hemostatic clips were successfully placed (MR       conditional). Clip manufacturer: [employer]. There was no       bleeding at the end of the procedure.      There was no evidence of significant pathology in the entire examined       portion of jejunum. Area  (extent of insertion) was tattooed with an       injection of 1 mL of Spot (carbon black). Impression:               - Normal esophagus.                           - Normal stomach.                           - A single non-bleeding angioectasia in the                            duodenum. Treated with argon plasma coagulation                            (APC). Clips (MR conditional) were placed. Clip                            manufacturer: [employer].                           - The examined portion of the jejunum was normal.                            Tattooed.                           - No specimens collected. Recommendation:           - Return patient to hospital ward for possible                            discharge same day. (at the discretion of the                            primary hospital service)                           - Resume regular  diet.                           - Our office will arrange a CBC a week after                            discharge.                           Oral iron supplement                           Check CBC prior to discharge since PRBCs transfused                            today. Procedure Code(s):        --- Professional ---                           [phone], Small intestinal endoscopy, enteroscopy                            beyond second portion of duodenum, not including                            ileum; with control of bleeding (eg, injection,                            bipolar cautery, unipolar cautery, laser, heater                            probe, stapler, plasma coagulator)                           [id number], Unlisted procedure, small intestine Diagnosis Code(s):        --- Professional ---                           K31.819, Angiodysplasia of stomach and duodenum                            without bleeding                           D50.0, Iron deficiency anemia secondary to blood                            loss (chronic)                            K92.1, Melena (includes Hematochezia)                           K92.2, Gastrointestinal hemorrhage, unspecified CPT copyright 2022 American Medical Association. All rights reserved. The codes documented in this report are preliminary and upon coder review may  be revised to meet current compliance requirements. Amillia Biffle L. Legrand, MD 09/08/2024 1:53:15 PM This report has been signed  electronically. Number of Addenda: 0

## 2024-09-08 NOTE — Transfer of Care (Signed)
 Immediate Anesthesia Transfer of Care Note  Patient: Krystal Maddox  Procedure(s) Performed: ENTEROSCOPY INJECTION, SUBMUCOSAL EGD, WITH ARGON PLASMA COAGULATION  Patient Location: Endoscopy Unit  Anesthesia Type:MAC  Level of Consciousness: drowsy and patient cooperative  Airway & Oxygen Therapy: Patient Spontanous Breathing  Post-op Assessment: Report given to RN and Post -op Vital signs reviewed and stable  Post vital signs: Reviewed and stable  Last Vitals:  Vitals Value Taken Time  BP 118/46 09/08/24 13:40  Temp    Pulse 83 09/08/24 13:41  Resp 28 09/08/24 13:41  SpO2 94 % 09/08/24 13:41  Vitals shown include unfiled device data.  Last Pain:  Vitals:   09/08/24 1340  TempSrc: Temporal  PainSc:       Patients Stated Pain Goal: 0 (09/07/24 1919)  Complications: No notable events documented.

## 2024-09-08 NOTE — Discharge Summary (Signed)
 " Physician Discharge Summary   Krystal Maddox FMW:[id number] DOB: 1941-04-26 DOA: 09/05/2024  PCP: Cloria Annabella CROME, DO  Admit date: 09/05/2024 Discharge date: 09/08/2024  Admitted From: Home Disposition:  Home Discharging physician: Alm Apo, MD Barriers to discharge: none  Recommendations at discharge: Follow up with GI Spent a long time prior to discharge with interpreter on phone trying to explain to patient to hold asa at discharge until follow ups. Encouraged her to continue with [facility] and med list as they provide; she seems paranoid for some reason but unable to get her to explain this further  Discharge Condition: stable CODE STATUS: Full  Diet recommendation:  Diet Orders (From admission, onward)     Start     Ordered   09/08/24 1441  Diet regular Fluid consistency: Thin  Diet effective now       Question:  Fluid consistency:  Answer:  Thin   09/08/24 1440            Hospital Course: Krystal Maddox is an 84 y.o. 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   She reports progressive fatigue and exertional shortness of breath, prompting outpatient evaluation by [facility] prior to admission. Laboratory testing revealed a hemoglobin of 6.9 g/dL (down from 9.2 g/dL in July 7974), so she was instructed to present to the ED for further evaluation and blood transfusion. She denies melena, hematochezia, or overt bleeding. In the ED, VSS, negative FOBT. Labs confirmed microcytic anemia (Hgb 7.2 g/dL, MCV 75) with thrombocytosis, leukocytosis, and worsened renal function (Cr 1.57, baseline ~1.0).    GI was consulted formally by EDP, Dr. Wilhelmenia recommended admission for diagnostic workup on 12/30. 1u RBCs ordered by EDP.  VCE commenced on 12/30.   Assessment and Plan  Symptomatic acute/progressive iron deficiency anemia - s/p 1 unit PRBC - s/p  dose of IV iron, would also likely benefit from IV iron referral at discharge with enteral intolerance and severity of deficiency.  - Due to suspicion for cryptogenic GI source despite recent colonoscopy (single AVM Tx w/APC and clip, polyps without dysplasia) and EGD (chronic inactive, H. pylori negative gastritis, friable mucosa) in July - s/p VCE on 12/30; found to have proximal small bowel bleed -Underwent small bowel enteroscopy on 1/1 and nonbleeding AVM in duodenum treated with APC, clips placed -Outpatient follow-up with GI planned at discharge and repeat CBC as well - Given presence of known bleed and further drop in hemoglobin to 7.3 g/dL this morning 1/1, will transfuse 1 unit PRBC on 09/08/2024 as well.  Hemoglobin posttransfusion 10.5 g/dL   HTN - resume home regimen    s/p TAVR 2022 - Typically would be on DAPT x3-6 months then indefinite ASA 81mg . Given concern for GI bleeding, was placed on hold. Would ideally be able to restart if no high-risk/bleeding lesions identified. May need discussion amongst GI and cardiology (sees Dr. Azobou Tonleu [facility]) going forward. - asa held on discharge until follow up outpatient with GI for H/H check; if Hgb stable, reasonable to resume asa.   AKI on stage IIIb CKD - patient has history of CKD3b. Baseline creat ~ 1 - 1.2, eGFR~ 43 - patient presents with increase in creat >0.3 mg/dL above baseline or creat increase >1.5x baseline presumed to have occurred within past 7 days PTA - creat 1.54 on admission; improved with  fluids    PAD: No critical ischemia noted.  - resume home regimen at d/c    Chronic hyponatremia: Na currently wnl. Suspicion was that this was due to HCTZ which is on allergies list now.   DMII  - continue home regimen and diet control    Thrombocytosis: Likely reactive to iron deficiency anemia   Leukocytosis: Without fever or source of infection noted at this time. Will monitor off antimicrobial treatment at this time.      The patient's acute and chronic medical conditions were treated accordingly. On day of discharge, patient was felt deemed stable for discharge. Patient/family member advised to call PCP or come back to ER if needed.   Principal Diagnosis: Symptomatic anemia  Discharge Diagnoses: Active Hospital Problems   Diagnosis Date Noted   Symptomatic anemia 03/12/2024    Priority: 1.   Iron deficiency anemia due to chronic blood loss 03/14/2024    Priority: 2.   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     Discharge Instructions     Increase activity slowly   Complete by: As directed       Allergies as of 09/08/2024       Reactions   Hydrochlorothiazide Other (See Comments)   Severe hyponatremia, irritation like being bit by bugs all over body   Tylenol  [acetaminophen ] Other (See Comments)   Pt reports long time ago she got sick on tylenol  she not sure what happen but takes Tylenol  now.        Medication List     PAUSE taking these medications    aspirin  EC 81 MG tablet Wait to take this until your doctor or other care provider tells you to start again. Hold until outpatient follow-up with gastroenterology Take 81 mg by mouth daily.       STOP taking these medications    furosemide  20 MG tablet Commonly known as: LASIX        TAKE these medications    acetaminophen  500 MG tablet Commonly known as: TYLENOL  Take 500 mg by mouth every 6 (six) hours as needed for headache.   amLODipine  10 MG tablet Commonly known as: NORVASC  Take 10 mg by mouth daily.   amoxicillin  500 MG tablet Commonly known as: AMOXIL  Take 4 tablets by mouth 1 hour prior to dental  procedures and cleanings   Arctic Relief Pain Relieving 0.2-3.5 % Gel Generic drug: Camphor-Menthol  Apply 1 Application topically 4 (four) times daily as needed (joint or muscle pain).   chlorhexidine  0.12 % solution Commonly known as: PERIDEX  Use as directed 15 mLs in the mouth or throat every evening. Swish for 30 seconds and spit   dextrose  40 % Gel Commonly known as: GLUTOSE Take 1 Tube by mouth every 15 (fifteen) minutes as needed for low blood sugar.   docusate sodium  100 MG capsule Commonly known as: COLACE Take 1 capsule (100 mg total) by mouth 2 (two) times daily as needed for mild constipation.   ferrous sulfate  325 (65 FE) MG tablet Take 1 tablet (325 mg total) by mouth daily.   glipiZIDE 5 MG tablet Commonly known as: GLUCOTROL Take 5 mg by mouth daily before breakfast.   GNP Calcium 600 +D/Minerals 600-800 MG-UNIT Tabs Take  1 tablet by mouth in the morning and at bedtime.   hydrALAZINE  50 MG tablet Commonly known as: APRESOLINE  Take 1 tablet (50 mg total) by mouth 3 (three) times daily.   insulin  aspart 100 UNIT/ML injection Commonly known as: novoLOG  Inject 10 Units into the skin See admin instructions. For CBG greater than 400   linaclotide 72 MCG capsule Commonly known as: LINZESS Take 1 capsule (72 mcg total) by mouth daily before breakfast.   linagliptin 5 MG Tabs tablet Commonly known as: TRADJENTA Take 5 mg by mouth daily.   losartan  25 MG tablet Commonly known as: COZAAR  Take 1 tablet (25 mg total) by mouth daily.   mineral oil-hydrophilic petrolatum ointment Apply 1 Application topically 2 (two) times daily as needed for dry skin.   Muscle Rub 10-15 % Maddox Apply 1 Application topically 4 (four) times daily as needed (joint/muscle pain).   Orajel 2X Toothache & Gum 20-0.26 % Gel Generic drug: Benzocaine-Menthol  Use as directed 1 Application in the mouth or throat 3 (three) times daily as needed (painful gum).   pantoprazole  40 MG  tablet Commonly known as: PROTONIX  Take 1 tablet (40 mg total) by mouth 2 (two) times daily.   polyethylene glycol 17 g packet Commonly known as: MIRALAX  / GLYCOLAX  Take 17 g by mouth daily as needed for moderate constipation.   pravastatin  80 MG tablet Commonly known as: PRAVACHOL  Take 80 mg by mouth every evening.   PRESERVISION AREDS 2 PO Take 1 tablet by mouth in the morning and at bedtime.   psyllium 95 % Pack Commonly known as: HYDROCIL/METAMUCIL Take 1 packet by mouth daily.   senna-docusate 8.6-50 MG tablet Commonly known as: Senokot-S Take 2 tablets by mouth at bedtime.   sodium bicarbonate 650 MG tablet Take 650 mg by mouth 2 (two) times daily.   Systane Hydration PF 0.4-0.3 % Soln Generic drug: Polyethyl Glyc-Propyl Glyc PF Place 1 drop into both eyes in the morning, at noon, in the evening, and at bedtime.   Systane Nighttime Oint Place 1 Application into both eyes at bedtime.   VITRON-C PO Take 1 tablet by mouth daily in the afternoon.        Allergies[1]  Consultations: GI  Procedures: 1/1: Small bowel enteroscopy  Discharge Exam: BP (!) 148/58 (BP Location: Left Arm)   Pulse 79   Temp 97.7 F (36.5 C) (Oral)   Resp 16   SpO2 98%  Physical Exam Constitutional:      Appearance: Normal appearance.  HENT:     Head: Normocephalic and atraumatic.     Mouth/Throat:     Mouth: Mucous membranes are moist.  Eyes:     Extraocular Movements: Extraocular movements intact.  Cardiovascular:     Rate and Rhythm: Normal rate and regular rhythm.  Pulmonary:     Effort: Pulmonary effort is normal. No respiratory distress.     Breath sounds: Normal breath sounds. No wheezing.  Abdominal:     General: Bowel sounds are normal. There is no distension.     Palpations: Abdomen is soft.     Tenderness: There is no abdominal tenderness.  Musculoskeletal:        General: Normal range of motion.     Cervical back: Normal range of motion and neck supple.   Skin:    General: Skin is warm and dry.  Neurological:     General: No focal deficit present.     Mental Status: She is alert.  Psychiatric:  Mood and Affect: Mood normal.      The results of significant diagnostics from this hospitalization (including imaging, microbiology, ancillary and laboratory) are listed below for reference.   Microbiology: No results found for this or any previous visit (from the past 240 hours).   Labs: BNP (last 3 results) Recent Labs    03/12/24 1743  BNP 795.1*   Basic Metabolic Panel: Recent Labs  Lab 09/05/24 1148 09/05/24 1759 09/06/24 0406 09/07/24 0441 09/08/24 0441  NA 140 137 139 136 138  K 3.7 3.6 3.3* 4.0 4.2  CL 101 99 105 104 106  CO2 28 26 25  21* 22  GLUCOSE 155* 200* 121* 122* 122*  BUN 33* 37* 30* 24* 28*  CREATININE 1.54* 1.57* 1.24* 1.15* 1.29*  CALCIUM 8.7 8.8* 8.3* 8.6* 8.9  MG  --   --   --  2.1  --    Liver Function Tests: Recent Labs  Lab 09/05/24 1148 09/05/24 1759  AST 18 28  ALT 11 12  ALKPHOS 61 75  BILITOT 0.2 0.3  PROT 8.0 8.4*  ALBUMIN 3.7 4.0   No results for input(s): LIPASE, AMYLASE in the last 168 hours. No results for input(s): AMMONIA in the last 168 hours. CBC: Recent Labs  Lab 09/05/24 1148 09/05/24 1759 09/06/24 0406 09/07/24 0441 09/08/24 0441 09/08/24 1504  WBC 11.5* 14.4* 12.5* 10.5 11.3*  --   NEUTROABS 8.7*  --   --  7.0  --   --   HGB 6.9 Repeated and verified X2.* 7.2* 8.1* 7.9* 7.3* 10.5*  HCT 22.4 Repeated and verified X2.* 24.0* 25.8* 25.0* 23.2* 32.1*  MCV 71.4* 75.2* 75.9* 74.6* 75.1*  --   PLT 411.0* 452* 355 363 348  --    Cardiac Enzymes: No results for input(s): CKTOTAL, CKMB, CKMBINDEX, TROPONINI in the last 168 hours. BNP: Invalid input(s): POCBNP CBG: Recent Labs  Lab 09/07/24 2037 09/08/24 0003 09/08/24 0359 09/08/24 0807 09/08/24 1226  GLUCAP 124* 131* 131* 115* 125*   D-Dimer No results for input(s): DDIMER in the last 72  hours. Hgb A1c No results for input(s): HGBA1C in the last 72 hours. Lipid Profile No results for input(s): CHOL, HDL, LDLCALC, TRIG, CHOLHDL, LDLDIRECT in the last 72 hours. Thyroid function studies No results for input(s): TSH, T4TOTAL, T3FREE, THYROIDAB in the last 72 hours.  Invalid input(s): FREET3 Anemia work up No results for input(s): VITAMINB12, FOLATE, FERRITIN, TIBC, IRON, RETICCTPCT in the last 72 hours. Urinalysis    Component Value Date/Time   COLORURINE YELLOW 03/12/2024 1428   APPEARANCEUR CLEAR 03/12/2024 1428   LABSPEC 1.010 03/12/2024 1428   PHURINE 6.0 03/12/2024 1428   GLUCOSEU NEGATIVE 03/12/2024 1428   HGBUR NEGATIVE 03/12/2024 1428   BILIRUBINUR NEGATIVE 03/12/2024 1428   KETONESUR NEGATIVE 03/12/2024 1428   PROTEINUR 100 (A) 03/12/2024 1428   NITRITE NEGATIVE 03/12/2024 1428   LEUKOCYTESUR NEGATIVE 03/12/2024 1428   Sepsis Labs Recent Labs  Lab 09/05/24 1759 09/06/24 0406 09/07/24 0441 09/08/24 0441  WBC 14.4* 12.5* 10.5 11.3*   Microbiology No results found for this or any previous visit (from the past 240 hours).  Procedures/Studies: No results found.   Time coordinating discharge: Over 30 minutes    Alm Apo, MD  [facility] 09/08/2024, 3:37 PM    [1]  Allergies Allergen Reactions   Hydrochlorothiazide Other (See Comments)    Severe hyponatremia, irritation like being bit by bugs all over body    Tylenol  [Acetaminophen ] Other (See Comments)  Pt reports long time ago she got sick on tylenol  she not sure what happen but takes Tylenol  now.   "

## 2024-09-08 NOTE — Anesthesia Preprocedure Evaluation (Signed)
"                                    Anesthesia Evaluation  Patient identified by MRN, date of birth, ID band Patient awake    Reviewed: Allergy & Precautions, H&P , NPO status , Patient's Chart, lab work & pertinent test results  Airway Mallampati: II   Neck ROM: full    Dental   Pulmonary neg pulmonary ROS   breath sounds clear to auscultation       Cardiovascular hypertension, + Peripheral Vascular Disease  + Valvular Problems/Murmurs  Rhythm:regular Rate:Normal  S/p TAVR 2022 for AS.   Neuro/Psych    GI/Hepatic GI bleeding   Endo/Other    Renal/GU Renal InsufficiencyRenal disease     Musculoskeletal  (+) Arthritis ,    Abdominal   Peds  Hematology  (+) Blood dyscrasia, anemia   Anesthesia Other Findings   Reproductive/Obstetrics                              Anesthesia Physical Anesthesia Plan  ASA: 3  Anesthesia Plan: MAC   Post-op Pain Management:    Induction: Intravenous  PONV Risk Score and Plan: 2 and Propofol  infusion and Treatment may vary due to age or medical condition  Airway Management Planned: Nasal Cannula  Additional Equipment:   Intra-op Plan:   Post-operative Plan:   Informed Consent: I have reviewed the patients History and Physical, chart, labs and discussed the procedure including the risks, benefits and alternatives for the proposed anesthesia with the patient or authorized representative who has indicated his/her understanding and acceptance.     Dental advisory given  Plan Discussed with: CRNA, Anesthesiologist and Surgeon  Anesthesia Plan Comments:         Anesthesia Quick Evaluation  "

## 2024-09-09 ENCOUNTER — Telehealth: Payer: Self-pay

## 2024-09-09 ENCOUNTER — Ambulatory Visit
Payer: Medicare (Managed Care) | Attending: Cardiology | Admitting: Pharmacist Clinician (PhC)/ Clinical Pharmacy Specialist

## 2024-09-09 ENCOUNTER — Other Ambulatory Visit: Payer: Self-pay

## 2024-09-09 ENCOUNTER — Encounter: Payer: Self-pay | Admitting: Pharmacist Clinician (PhC)/ Clinical Pharmacy Specialist

## 2024-09-09 VITALS — BP 140/62 | HR 84

## 2024-09-09 DIAGNOSIS — K5520 Angiodysplasia of colon without hemorrhage: Secondary | ICD-10-CM

## 2024-09-09 DIAGNOSIS — D509 Iron deficiency anemia, unspecified: Secondary | ICD-10-CM

## 2024-09-09 DIAGNOSIS — I10 Essential (primary) hypertension: Secondary | ICD-10-CM | POA: Diagnosis not present

## 2024-09-09 LAB — TYPE AND SCREEN
ABO/RH(D): A POS
Antibody Screen: NEGATIVE
Unit division: 0
Unit division: 0

## 2024-09-09 LAB — BPAM RBC
Blood Product Expiration Date: 202601102359
Blood Product Expiration Date: 202601202359
ISSUE DATE / TIME: 202512292155
ISSUE DATE / TIME: 202601010906
Unit Type and Rh: 6200
Unit Type and Rh: 6200

## 2024-09-09 MED ORDER — LOSARTAN POTASSIUM 50 MG PO TABS: 50.0000 mg | ORAL_TABLET | Freq: Every day | ORAL | 3 refills | Status: AC

## 2024-09-09 NOTE — Progress Notes (Signed)
 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 female patient of Dr Ren Ny, in the office today for hypertension evaluation.   She was last seen in the office about 3 weeks ago, with a BP of 190/74.  She was unsure of medication names, but stated compliance with what she had.  He started losartan  25 mg daily and asked that she bring all medications to office for follow up.   Since then she was admitted to [facility] on the advice of GI, for anemia.  She was found to have a small bowel bleed.  She was discharged yesterday.    Today she is in the office with an interpreter.  She is feeling well today.  She never started the losartan  that Dr. Ren Ny prescribed.  I believe it was sent to a local pharmacy, but she gets her medications through [facility] and the pharmacy on her medication cards and bottles is listed in [place].  She probably wasn't aware that it was sent locally.    Blood Pressure Goal:  130/80  Current Medications:  amlodipine  10 mg daily, hydralazine  50 mg tid,   Previously tried: hctz - hyponatremia   Social Hx:      Tobacco: no  Alcohol:no  Caffeine: no  Diet:  mostly rice with garden vegetables and/or beans;   Exercise: no  Home BP readings:   no home device   Adherence Assessment  Do you ever forget to take your medication? [] Yes [x] No  Do you ever skip doses due to side effects? [] Yes [x] No  Do you have trouble affording your medicines? [] Yes [x] No  Are you ever unable to pick up your medication due to  transportation difficulties? [] Yes [x] No   Adherence strategy: medications on weekly punch cards  Accessory Clinical Findings    Lab Results  Component Value Date   CREATININE 1.29 (H) 09/08/2024   BUN 28 (H) 09/08/2024   NA 138 09/08/2024   K 4.2 09/08/2024   CL 106 09/08/2024   CO2 22 09/08/2024   Lab Results  Component Value Date   ALT 12 09/05/2024   AST 28 09/05/2024   ALKPHOS 75 09/05/2024   BILITOT 0.3 09/05/2024   Lab Results  Component Value Date   HGBA1C 6.4 (H) 03/12/2024    Home Medications    Current Outpatient Medications  Medication Sig Dispense Refill   acetaminophen  (TYLENOL ) 500 MG tablet Take 500 mg by mouth every 6 (six) hours as needed for headache.     amLODipine  (NORVASC ) 10 MG tablet Take 10 mg by mouth daily.   0   Benzocaine-Menthol  (ORAJEL 2X TOOTHACHE & GUM) 20-0.26 % GEL Use as directed 1 Application in the mouth or throat 3 (three) times daily as needed (painful gum).     Calcium Carbonate-Vit D-Min (GNP CALCIUM 600 +D/MINERALS) 600-800 MG-UNIT TABS Take 1 tablet by mouth in the morning and at bedtime.     Camphor-Menthol  (ARCTIC RELIEF PAIN RELIEVING) 0.2-3.5 % GEL Apply 1 Application topically 4 (four) times  daily as needed (joint or muscle pain).     dextrose  (GLUTOSE) 40 % GEL Take 1 Tube by mouth every 15 (fifteen) minutes as needed for low blood sugar.     docusate sodium  (COLACE) 100 MG capsule Take 1 capsule (100 mg total) by mouth 2 (two) times daily as needed for mild constipation. 10 capsule 0   glipiZIDE (GLUCOTROL) 5 MG tablet Take 5 mg by mouth daily before breakfast.     hydrALAZINE  (APRESOLINE ) 50 MG tablet Take 1 tablet (50 mg total) by mouth 3 (three) times daily. 270 tablet 1   Iron-Vitamin C (VITRON-C PO) Take 1 tablet by mouth daily in the afternoon.     linaclotide (LINZESS) 72 MCG capsule Take 1 capsule (72 mcg total) by mouth daily before breakfast. 30 capsule 3   linagliptin (TRADJENTA) 5 MG TABS tablet Take 5 mg by  mouth daily.     losartan  (COZAAR ) 50 MG tablet Take 1 tablet (50 mg total) by mouth daily. 90 tablet 3   Menthol -Methyl Salicylate  (MUSCLE RUB) 10-15 % CREA Apply 1 Application topically 4 (four) times daily as needed (joint/muscle pain). 85 g 0   mineral oil-hydrophilic petrolatum (AQUAPHOR) ointment Apply 1 Application topically 2 (two) times daily as needed for dry skin.     Multiple Vitamins-Minerals (PRESERVISION AREDS 2 PO) Take 1 tablet by mouth in the morning and at bedtime.     pantoprazole  (PROTONIX ) 40 MG tablet Take 1 tablet (40 mg total) by mouth 2 (two) times daily. 60 tablet 0   polyethylene glycol (MIRALAX  / GLYCOLAX ) 17 g packet Take 17 g by mouth daily as needed for moderate constipation. 14 each 0   pravastatin  (PRAVACHOL ) 80 MG tablet Take 80 mg by mouth every evening.     sodium bicarbonate 650 MG tablet Take 650 mg by mouth 2 (two) times daily.     [Paused] aspirin  EC 81 MG tablet Take 81 mg by mouth daily.     chlorhexidine  (PERIDEX ) 0.12 % solution Use as directed 15 mLs in the mouth or throat every evening. Swish for 30 seconds and spit     ferrous sulfate  325 (65 FE) MG tablet Take 1 tablet (325 mg total) by mouth daily.     insulin  aspart (NOVOLOG ) 100 UNIT/ML injection Inject 10 Units into the skin See admin instructions. For CBG greater than 400 (Patient not taking: Reported on 09/09/2024)     Polyethyl Glyc-Propyl Glyc PF (SYSTANE HYDRATION PF) 0.4-0.3 % SOLN Place 1 drop into both eyes in the morning, at noon, in the evening, and at bedtime.     psyllium (HYDROCIL/METAMUCIL) 95 % PACK Take 1 packet by mouth daily.     senna-docusate (SENOKOT-S) 8.6-50 MG tablet Take 2 tablets by mouth at bedtime.     White Petrolatum-Mineral Oil (SYSTANE NIGHTTIME) OINT Place 1 Application into both eyes at bedtime.     No current facility-administered medications for this visit.         Assessment & Plan    Hypertension Assessment: BP is uncontrolled in office BP 150/58 mmHg;   above the goal (<130/80). Has not taken losartan  -was sent to local pharmacy, she gets meds from [facility] Tolerates current medications well, without any side effects Denies SOB, palpitation, chest pain, headaches,or swelling Reiterated the importance of regular exercise and low salt diet   Plan:  Start taking losartan  50 mg once daily (when it arrives) Continue taking all other medications Patient to keep record of BP  readings with heart rate and report to us  at the next visit Patient to follow up with me in 6 weeks  Labs ordered today: BMET in 2 weeks    Allean Mink PharmD CPP [facility]  [address] [place], [place] [address] [phone]       [1]  Allergies Allergen Reactions   Hydrochlorothiazide Other (See Comments)    Severe hyponatremia, irritation like being bit by bugs all over body    Tylenol  [Acetaminophen ] Other (See Comments)    Pt reports long time ago she got sick on tylenol  she not sure what happen but takes Tylenol  now.   "

## 2024-09-09 NOTE — Telephone Encounter (Signed)
 Order for lab placed in Epic.  Unable  to reach pt through a [place] interpreter.

## 2024-09-09 NOTE — Telephone Encounter (Signed)
-----   Message from Alan JONELLE Coombs sent at 09/09/2024  7:34 AM EST ----- Regarding: FW: CBC post hospital Can we set up for repeat CBC in a week?  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        Went home Thurs, should be on oral iron Needs CBC in a week  - HD

## 2024-09-09 NOTE — Patient Instructions (Signed)
 Follow up appointment: Monday Feb 23 at 1:45 pm  Go to the lab in 2 weeks to check kidney function  Take your BP meds as follows: start losartan  50 mg once daily (when it arrives).    Your blood pressure goal is < 130/80  To check your pressure at home you will need to:  1. Sit up in a chair, with feet flat on the floor and back supported. Do not cross your ankles or legs. 2. Rest your left arm so that the cuff is about heart level. If the cuff goes on your upper arm,  then just relax the arm on the table, arm of the chair or your lap. If you have a wrist cuff, we  suggest relaxing your wrist against your chest (think of it as Pledging the Flag with the  wrong arm).  3. Place the cuff snugly around your arm, about 1 inch above the crook of your elbow. The  cords should be inside the groove of your elbow.  4. Sit quietly, with the cuff in place, for about 5 minutes. After that 5 minutes press the power  button to start a reading. 5. Do not talk or move while the reading is taking place.  6. Record your readings on a sheet of paper. Although most cuffs have a memory, it is often  easier to see a pattern developing when the numbers are all in front of you.  7. You can repeat the reading after 1-3 minutes if it is recommended  Make sure your bladder is empty and you have not had caffeine or tobacco within the last 30 min  Always bring your blood pressure log with you to your appointments. If you have not brought your monitor in to be double checked for accuracy, please bring it to your next appointment.  You can find a list of quality blood pressure cuffs at [email or web address].no  Important lifestyle changes to control high blood pressure  Intervention  Effect on the BP  Lose extra pounds and watch your waistline Weight loss is one of the most effective lifestyle changes for controlling blood pressure. If you're overweight or obese, losing even a small amount of weight can help reduce blood  pressure. Blood pressure might go down by about 1 millimeter of mercury (mm Hg) with each kilogram (about 2.2 pounds) of weight lost.  Exercise regularly As a general goal, aim for at least 30 minutes of moderate physical activity every day. Regular physical activity can lower high blood pressure by about 5 to 8 mm Hg.  Eat a healthy diet Eating a diet rich in whole grains, fruits, vegetables, and low-fat dairy products and low in saturated fat and cholesterol. A healthy diet can lower high blood pressure by up to 11 mm Hg.  Reduce salt (sodium) in your diet Even a small reduction of sodium in the diet can improve heart health and reduce high blood pressure by about 5 to 6 mm Hg.  Limit alcohol One drink equals 12 ounces of beer, 5 ounces of wine, or 1.5 ounces of 80-proof liquor.  Limiting alcohol to less than one drink a day for women or two drinks a day for men can help lower blood pressure by about 4 mm Hg.   If you have any questions or concerns please use My Chart to send questions or call the office at [phone]

## 2024-09-09 NOTE — Assessment & Plan Note (Signed)
 Assessment: BP is uncontrolled in office BP 150/58 mmHg;  above the goal (<130/80). Has not taken losartan  -was sent to local pharmacy, she gets meds from [facility] Tolerates current medications well, without any side effects Denies SOB, palpitation, chest pain, headaches,or swelling Reiterated the importance of regular exercise and low salt diet   Plan:  Start taking losartan  50 mg once daily (when it arrives) Continue taking all other medications Patient to keep record of BP readings with heart rate and report to us  at the next visit Patient to follow up with me in 6 weeks  Labs ordered today: BMET in 2 weeks

## 2024-09-10 NOTE — Anesthesia Postprocedure Evaluation (Signed)
"   Anesthesia Post Note  Patient: Krystal Maddox  Procedure(s) Performed: ENTEROSCOPY INJECTION, SUBMUCOSAL EGD, WITH ARGON PLASMA COAGULATION     Patient location during evaluation: Endoscopy Anesthesia Type: MAC Level of consciousness: awake and alert Pain management: pain level controlled Vital Signs Assessment: post-procedure vital signs reviewed and stable Respiratory status: spontaneous breathing, nonlabored ventilation, respiratory function stable and patient connected to nasal cannula oxygen Cardiovascular status: stable and blood pressure returned to baseline Postop Assessment: no apparent nausea or vomiting Anesthetic complications: no   No notable events documented.  Last Vitals:  Vitals:   09/08/24 1559 09/08/24 1627  BP: (!) 167/57 (!) 167/57  Pulse: 79   Resp: 17   Temp: 36.6 C   SpO2: 99%     Last Pain:  Vitals:   09/08/24 1559  TempSrc: Oral  PainSc:                  [name]      "

## 2024-09-12 NOTE — Telephone Encounter (Signed)
 Interpreter services stated that there is no [name] interpreter available at this time.

## 2024-09-13 NOTE — Progress Notes (Signed)
 I agree with the assessment and plan as outlined by Ms. Craig.

## 2024-09-19 ENCOUNTER — Other Ambulatory Visit (HOSPITAL_COMMUNITY): Payer: Self-pay

## 2024-09-20 NOTE — Telephone Encounter (Signed)
 Pt was contacted through a [name] interpreter. Pt was notified of the recommendations. Order for the lab is in Epic. Location to the lab provided. Pt requested that transpiration be arranged through [facility].  Pt verbalized understanding with all questions answered.   [facility] was contacted and spoke with Randine with Scheduling. Randine to arrange transportation for the pt to come to our lab.

## 2024-09-28 NOTE — Telephone Encounter (Signed)
 Inbound call from [facility] stating patient transportation driver got mixed up when bringing patient for labs. Requesting to have lab orders faxed to  [phone] so patient is able to have labs completed there. Please advise, thank you

## 2024-10-04 NOTE — Telephone Encounter (Signed)
 Unable to contact [facility]. There office is closed due to the inclement weather.

## 2024-10-07 ENCOUNTER — Ambulatory Visit: Payer: Medicare (Managed Care) | Admitting: Pharmacist

## 2024-10-11 NOTE — Telephone Encounter (Signed)
 Unable to contact [facility]. There office is closed due to the inclement weather.

## 2024-10-31 ENCOUNTER — Ambulatory Visit: Payer: Medicare (Managed Care) | Admitting: Pharmacist Clinician (PhC)/ Clinical Pharmacy Specialist

## 2024-11-30 ENCOUNTER — Ambulatory Visit: Payer: Medicare (Managed Care) | Admitting: Physician Assistant

## 2024-12-02 ENCOUNTER — Ambulatory Visit: Payer: Medicare (Managed Care) | Admitting: Physician Assistant

## 2025-02-17 ENCOUNTER — Ambulatory Visit (HOSPITAL_COMMUNITY): Payer: Medicare (Managed Care)
